# Patient Record
Sex: Female | Born: 1955 | Race: White | Hispanic: No | Smoking: Former smoker
Health system: Southern US, Community
[De-identification: ages and names within clinical notes are randomized; demographics above are authoritative.]

## PROBLEM LIST (undated history)

## (undated) DIAGNOSIS — F319 Bipolar disorder, unspecified: Secondary | ICD-10-CM

## (undated) DIAGNOSIS — M1990 Unspecified osteoarthritis, unspecified site: Secondary | ICD-10-CM

## (undated) DIAGNOSIS — K219 Gastro-esophageal reflux disease without esophagitis: Secondary | ICD-10-CM

## (undated) DIAGNOSIS — J449 Chronic obstructive pulmonary disease, unspecified: Secondary | ICD-10-CM

## (undated) DIAGNOSIS — K2270 Barrett's esophagus without dysplasia: Secondary | ICD-10-CM

## (undated) DIAGNOSIS — E119 Type 2 diabetes mellitus without complications: Secondary | ICD-10-CM

## (undated) DIAGNOSIS — F0390 Unspecified dementia without behavioral disturbance: Secondary | ICD-10-CM

## (undated) HISTORY — PX: BREAST CYST EXCISION: SHX579

## (undated) HISTORY — PX: BREAST SURGERY: SHX581

---

## 2014-03-18 ENCOUNTER — Ambulatory Visit: Payer: Self-pay | Admitting: Internal Medicine

## 2014-03-18 DIAGNOSIS — Z0289 Encounter for other administrative examinations: Secondary | ICD-10-CM

## 2016-02-03 ENCOUNTER — Inpatient Hospital Stay (HOSPITAL_BASED_OUTPATIENT_CLINIC_OR_DEPARTMENT_OTHER)
Admission: EM | Admit: 2016-02-03 | Discharge: 2016-02-11 | DRG: 603 | Disposition: A | Discharge status: Home / Self Care | Payer: Medicare Other | Attending: Internal Medicine | Admitting: Internal Medicine

## 2016-02-03 ENCOUNTER — Emergency Department (HOSPITAL_BASED_OUTPATIENT_CLINIC_OR_DEPARTMENT_OTHER): Payer: Medicare Other

## 2016-02-03 ENCOUNTER — Encounter (HOSPITAL_BASED_OUTPATIENT_CLINIC_OR_DEPARTMENT_OTHER): Payer: Self-pay

## 2016-02-03 DIAGNOSIS — E871 Hypo-osmolality and hyponatremia: Secondary | ICD-10-CM | POA: Diagnosis present

## 2016-02-03 DIAGNOSIS — N10 Acute tubulo-interstitial nephritis: Secondary | ICD-10-CM | POA: Diagnosis present

## 2016-02-03 DIAGNOSIS — E1165 Type 2 diabetes mellitus with hyperglycemia: Secondary | ICD-10-CM | POA: Diagnosis not present

## 2016-02-03 DIAGNOSIS — Z881 Allergy status to other antibiotic agents status: Secondary | ICD-10-CM | POA: Diagnosis not present

## 2016-02-03 DIAGNOSIS — N179 Acute kidney failure, unspecified: Secondary | ICD-10-CM | POA: Diagnosis present

## 2016-02-03 DIAGNOSIS — L03211 Cellulitis of face: Secondary | ICD-10-CM

## 2016-02-03 DIAGNOSIS — Z6841 Body Mass Index (BMI) 40.0 and over, adult: Secondary | ICD-10-CM | POA: Diagnosis not present

## 2016-02-03 DIAGNOSIS — L509 Urticaria, unspecified: Secondary | ICD-10-CM | POA: Diagnosis present

## 2016-02-03 DIAGNOSIS — Z87891 Personal history of nicotine dependence: Secondary | ICD-10-CM

## 2016-02-03 DIAGNOSIS — M1990 Unspecified osteoarthritis, unspecified site: Secondary | ICD-10-CM | POA: Diagnosis present

## 2016-02-03 DIAGNOSIS — F0390 Unspecified dementia without behavioral disturbance: Secondary | ICD-10-CM | POA: Diagnosis present

## 2016-02-03 DIAGNOSIS — D649 Anemia, unspecified: Secondary | ICD-10-CM | POA: Diagnosis present

## 2016-02-03 DIAGNOSIS — Z888 Allergy status to other drugs, medicaments and biological substances status: Secondary | ICD-10-CM

## 2016-02-03 DIAGNOSIS — F319 Bipolar disorder, unspecified: Secondary | ICD-10-CM | POA: Diagnosis present

## 2016-02-03 DIAGNOSIS — Z794 Long term (current) use of insulin: Secondary | ICD-10-CM

## 2016-02-03 DIAGNOSIS — K3184 Gastroparesis: Secondary | ICD-10-CM | POA: Diagnosis present

## 2016-02-03 DIAGNOSIS — J449 Chronic obstructive pulmonary disease, unspecified: Secondary | ICD-10-CM | POA: Diagnosis present

## 2016-02-03 DIAGNOSIS — L03213 Periorbital cellulitis: Principal | ICD-10-CM | POA: Diagnosis present

## 2016-02-03 DIAGNOSIS — Z9981 Dependence on supplemental oxygen: Secondary | ICD-10-CM

## 2016-02-03 DIAGNOSIS — Z9119 Patient's noncompliance with other medical treatment and regimen: Secondary | ICD-10-CM

## 2016-02-03 DIAGNOSIS — K122 Cellulitis and abscess of mouth: Secondary | ICD-10-CM

## 2016-02-03 DIAGNOSIS — E1143 Type 2 diabetes mellitus with diabetic autonomic (poly)neuropathy: Secondary | ICD-10-CM | POA: Diagnosis present

## 2016-02-03 DIAGNOSIS — G4733 Obstructive sleep apnea (adult) (pediatric): Secondary | ICD-10-CM | POA: Diagnosis present

## 2016-02-03 DIAGNOSIS — W06XXXA Fall from bed, initial encounter: Secondary | ICD-10-CM | POA: Diagnosis present

## 2016-02-03 DIAGNOSIS — G43909 Migraine, unspecified, not intractable, without status migrainosus: Secondary | ICD-10-CM | POA: Diagnosis present

## 2016-02-03 DIAGNOSIS — K219 Gastro-esophageal reflux disease without esophagitis: Secondary | ICD-10-CM | POA: Diagnosis present

## 2016-02-03 DIAGNOSIS — R140 Abdominal distension (gaseous): Secondary | ICD-10-CM

## 2016-02-03 DIAGNOSIS — E119 Type 2 diabetes mellitus without complications: Secondary | ICD-10-CM

## 2016-02-03 DIAGNOSIS — Z79899 Other long term (current) drug therapy: Secondary | ICD-10-CM | POA: Diagnosis not present

## 2016-02-03 DIAGNOSIS — Z7951 Long term (current) use of inhaled steroids: Secondary | ICD-10-CM | POA: Diagnosis not present

## 2016-02-03 DIAGNOSIS — L0390 Cellulitis, unspecified: Secondary | ICD-10-CM | POA: Diagnosis present

## 2016-02-03 HISTORY — DX: Unspecified osteoarthritis, unspecified site: M19.90

## 2016-02-03 HISTORY — DX: Unspecified dementia, unspecified severity, without behavioral disturbance, psychotic disturbance, mood disturbance, and anxiety: F03.90

## 2016-02-03 HISTORY — DX: Type 2 diabetes mellitus without complications: E11.9

## 2016-02-03 HISTORY — DX: Gastro-esophageal reflux disease without esophagitis: K21.9

## 2016-02-03 HISTORY — DX: Chronic obstructive pulmonary disease, unspecified: J44.9

## 2016-02-03 HISTORY — DX: Bipolar disorder, unspecified: F31.9

## 2016-02-03 HISTORY — DX: Barrett's esophagus without dysplasia: K22.70

## 2016-02-03 LAB — CBC WITH DIFFERENTIAL/PLATELET
BASOS PCT: 0 %
Basophils Absolute: 0 10*3/uL (ref 0.0–0.1)
EOS PCT: 3 %
Eosinophils Absolute: 0.2 10*3/uL (ref 0.0–0.7)
HEMATOCRIT: 33.3 % — AB (ref 36.0–46.0)
HEMOGLOBIN: 10.8 g/dL — AB (ref 12.0–15.0)
LYMPHS PCT: 16 %
Lymphs Abs: 1 10*3/uL (ref 0.7–4.0)
MCH: 29 pg (ref 26.0–34.0)
MCHC: 32.4 g/dL (ref 30.0–36.0)
MCV: 89.5 fL (ref 78.0–100.0)
Monocytes Absolute: 0.7 10*3/uL (ref 0.1–1.0)
Monocytes Relative: 11 %
NEUTROS ABS: 4.5 10*3/uL (ref 1.7–7.7)
NEUTROS PCT: 70 %
Platelets: 182 10*3/uL (ref 150–400)
RBC: 3.72 MIL/uL — ABNORMAL LOW (ref 3.87–5.11)
RDW: 14 % (ref 11.5–15.5)
WBC: 6.4 10*3/uL (ref 4.0–10.5)

## 2016-02-03 LAB — COMPREHENSIVE METABOLIC PANEL
ALBUMIN: 3.2 g/dL — AB (ref 3.5–5.0)
ALT: 23 U/L (ref 14–54)
ANION GAP: 9 (ref 5–15)
AST: 18 U/L (ref 15–41)
Alkaline Phosphatase: 78 U/L (ref 38–126)
BUN: 5 mg/dL — ABNORMAL LOW (ref 6–20)
CHLORIDE: 96 mmol/L — AB (ref 101–111)
CO2: 25 mmol/L (ref 22–32)
CREATININE: 0.42 mg/dL — AB (ref 0.44–1.00)
Calcium: 8.5 mg/dL — ABNORMAL LOW (ref 8.9–10.3)
GFR calc non Af Amer: >60 mL/min (ref >60)
GLUCOSE: 234 mg/dL — AB (ref 65–99)
Potassium: 3.8 mmol/L (ref 3.5–5.1)
SODIUM: 130 mmol/L — AB (ref 135–145)
Total Bilirubin: 0.3 mg/dL (ref 0.3–1.2)
Total Protein: 7 g/dL (ref 6.5–8.1)

## 2016-02-03 LAB — CBG MONITORING, ED: Glucose-Capillary: 210 mg/dL — ABNORMAL HIGH (ref 65–99)

## 2016-02-03 LAB — I-STAT CG4 LACTIC ACID, ED: Lactic Acid, Venous: 1.49 mmol/L (ref 0.5–1.9)

## 2016-02-03 MED ORDER — VANCOMYCIN HCL 500 MG IV SOLR
INTRAVENOUS | Status: AC
  Filled 2016-02-03: qty 2000

## 2016-02-03 MED ORDER — VANCOMYCIN HCL IN DEXTROSE 1-5 GM/200ML-% IV SOLN: 1000.0000 mg | Freq: Once | INTRAVENOUS | Status: DC

## 2016-02-03 MED ORDER — VANCOMYCIN HCL 10 G IV SOLR
2000.0000 mg | Freq: Once | INTRAVENOUS | Status: DC
  Filled 2016-02-03: qty 2000

## 2016-02-03 MED ORDER — VANCOMYCIN HCL IN DEXTROSE 750-5 MG/150ML-% IV SOLN
750.0000 mg | Freq: Two times a day (BID) | INTRAVENOUS | Status: DC
  Administered 2016-02-04 – 2016-02-06 (×5): 750 mg via INTRAVENOUS
  Filled 2016-02-03 (×7): qty 150

## 2016-02-03 MED ORDER — VANCOMYCIN HCL 10 G IV SOLR
2000.0000 mg | Freq: Once | INTRAVENOUS | Status: AC
  Administered 2016-02-03: 2000 mg via INTRAVENOUS
  Filled 2016-02-03: qty 2000

## 2016-02-03 NOTE — Progress Notes (Signed)
Pharmacy Antibiotic Note  Stephanie Rice is a 60 y.o. female admitted on 02/03/2016 with facial cellulitis.  Pharmacy has been consulted for vancomycin dosing. Per notes, started on clindamycin outpatient several days PTA. Patient afebrile, WBC normal at 6.4, and lactate slightly elevated at 1.49.   Plan: Vancomycin 2g IV once in ED (2 x 1g) Vancomycin 750 mg IV every 12 hours. Goal trough 10-15 mcg/mL. Monitor renal function, clinical picture, culture results, and vancomycin trough as needed.   Height: 5\' 2"  (157.5 cm) Weight: 246 lb (111.6 kg) IBW/kg (Calculated) : 50.1  Temp (24hrs), Avg:98.2 F (36.8 C), Min:98.2 F (36.8 C), Max:98.2 F (36.8 C)   Recent Labs Lab 02/03/16 1510 02/03/16 1518  WBC 6.4  --   CREATININE 0.42*  --   LATICACIDVEN  --  1.49    Estimated Creatinine Clearance: 86 mL/min (by C-G formula based on SCr of 0.42 mg/dL (L)).    Allergies  Allergen Reactions  . Ciprofloxacin   . Macrobid [Nitrofurantoin]     Antimicrobials this admission: 9/20 Vanc >>   Dose adjustments this admission: N/A  Microbiology results: pending   Thank you for allowing pharmacy to be a part of this patient's care.  York CeriseKatherine Cook, PharmD Pharmacy Resident  Pager [phone] 02/03/16 4:04 PM

## 2016-02-03 NOTE — ED Notes (Signed)
Per pt's daughter pt is to have 2L of O2 at bedtime.

## 2016-02-03 NOTE — ED Provider Notes (Signed)
Medical screening exam:   Patient with history of bipolar is a difficult historian. History provided by daughter. Patient presents with 36 hours of right-sided facial swelling and erythema. Questionable history of fall. No known fevers or chills. Recently seen by her primary physician and started on steroids and antibiotics without improvement. Patient has erythematous swelling to the right side of his face mostly in the periorbital region. Oropharynx is clear. She she is in no respiratory distress. We'll get CT head and maxillofacial as well as basic labs.   Loren Raceravid Torian Quintero, MD 02/03/16 [phone]

## 2016-02-03 NOTE — ED Notes (Signed)
Pt's daughter/POA is Clide DeutscherKay Battaglia and phone # is [phone]

## 2016-02-03 NOTE — ED Notes (Signed)
Difficulty obtained 2nd blood cx. 2nd obtained at this time by Lupita Leashonna, RN

## 2016-02-03 NOTE — ED Provider Notes (Signed)
Emergency Department Provider Note   I have reviewed the triage vital signs and the nursing notes.   HISTORY  Chief Complaint Facial Swelling   HPI Stephanie Rice is a 60 y.o. female with PMH of COPD, DM, and GERD presents to the emergency department for evaluation of face swelling and redness that has been worsening over the past 3 days. The patient's family member at bedside states that she's had this once before and was presumed to be either an infection or allergic reaction. The family members states she was treated with antibiotics and the infection improved. They note a fall from bed 3 days ago with no known head trauma. Later that day the patient developed some redness underneath the left eye which quickly spread to involve the right eye, right side of the face, now the back of the head. When the initial swelling and redness began the patient saw her primary care physician who prescribed clindamycin and steroids. The patient has been compliant with his medication but her redness and pain has gotten worse. They deny fever. No pain with extraocular movements. No blurry vision. No difficulty swallowing or breathing. No associated vomiting or diarrhea.    Past Medical History:  Diagnosis Date  . Arthritis   . Barrett esophagus   . COPD (chronic obstructive pulmonary disease) (HCC)   . Dementia   . Diabetes mellitus without complication (HCC)   . GERD (gastroesophageal reflux disease)     Patient Active Problem List   Diagnosis Date Noted  . Cellulitis 02/03/2016    Past Surgical History:  Procedure Laterality Date  . BREAST CYST EXCISION    . BREAST SURGERY        Allergies Ciprofloxacin and Macrobid [nitrofurantoin]  History reviewed. No pertinent family history.  Social History Social History  Substance Use Topics  . Smoking status: Former Games developer  . Smokeless tobacco: Never Used  . Alcohol use No    Review of Systems  Constitutional: No  fever/chills Eyes: No visual changes. ENT: No sore throat. Cardiovascular: Denies chest pain. Respiratory: Denies shortness of breath. Gastrointestinal: No abdominal pain.  No nausea, no vomiting.  No diarrhea.  No constipation. Genitourinary: Negative for dysuria. Musculoskeletal: Negative for back pain. Skin: Rash over face and scalp.  Neurological: Negative for headaches, focal weakness or numbness.  10-point ROS otherwise negative.  ____________________________________________   PHYSICAL EXAM:  VITAL SIGNS: ED Triage Vitals  Enc Vitals Group     BP 02/03/16 1326 123/58     Pulse Rate 02/03/16 1326 76     Resp 02/03/16 1326 20     Temp 02/03/16 1326 98.2 F (36.8 C)     Temp Source 02/03/16 1326 Oral     SpO2 02/03/16 1326 96 %     Weight 02/03/16 1327 246 lb (111.6 kg)     Height 02/03/16 1327 5\' 2"  (1.575 m)   Constitutional: Alert and oriented. Well appearing and in no acute distress. Eyes: Conjunctivae are normal. PERRL. EOMI without pain.  Head: Atraumatic. Nose: No congestion/rhinnorhea. Mouth/Throat: Mucous membranes are moist.  Oropharynx non-erythematous. Neck: No stridor.  Cardiovascular: Normal rate, regular rhythm. Good peripheral circulation. Grossly normal heart sounds.   Respiratory: Normal respiratory effort.  No retractions. Lungs CTAB. Gastrointestinal: Soft and nontender. No distention.  Musculoskeletal: No lower extremity tenderness nor edema. No gross deformities of extremities. Neurologic:  Normal speech and language. No gross focal neurologic deficits are appreciated.  Skin:  Skin is warm, dry and intact. Bilateral periorbital edema  with right face diffuse erythema extending to the posterior scalp.  Psychiatric: Mood and affect are normal. Speech and behavior are normal.  ____________________________________________   LABS (all labs ordered are listed, but only abnormal results are displayed)  Labs Reviewed  COMPREHENSIVE METABOLIC PANEL -  Abnormal; Notable for the following:       Result Value   Sodium 130 (*)    Chloride 96 (*)    Glucose, Bld 234 (*)    BUN 5 (*)    Creatinine, Ser 0.42 (*)    Calcium 8.5 (*)    Albumin 3.2 (*)    All other components within normal limits  CBC WITH DIFFERENTIAL/PLATELET - Abnormal; Notable for the following:    RBC 3.72 (*)    Hemoglobin 10.8 (*)    HCT 33.3 (*)    All other components within normal limits  CBG MONITORING, ED - Abnormal; Notable for the following:    Glucose-Capillary 210 (*)    All other components within normal limits  CULTURE, BLOOD (ROUTINE X 2)  CULTURE, BLOOD (ROUTINE X 2)  CBC WITH DIFFERENTIAL/PLATELET  I-STAT CG4 LACTIC ACID, ED   ____________________________________________  EKG   EKG Interpretation  Date/Time:  Wednesday February 03 2016 15:36:49 EDT Ventricular Rate:  68 PR Interval:    QRS Duration: 92 QT Interval:  400 QTC Calculation: 426 R Axis:   84 Text Interpretation:  Sinus rhythm Borderline right axis deviation Borderline T abnormalities, lateral leads Baseline wander in lead(s) V6 No STEMI.  Confirmed by Brendolyn Stockley MD, Mylz Yuan [phone]([id number]) on 02/03/2016 4:42:36 PM       ____________________________________________  RADIOLOGY  Ct Head Wo Contrast  Result Date: 02/03/2016 CLINICAL DATA:  60 year old female with right facial swelling and redness for 3 days. Possible fall. Initial encounter. EXAM: CT HEAD WITHOUT CONTRAST CT MAXILLOFACIAL WITHOUT CONTRAST TECHNIQUE: Multidetector CT imaging of the head and maxillofacial structures were performed using the standard protocol without intravenous contrast. Multiplanar CT image reconstructions of the maxillofacial structures were also generated. COMPARISON:  [facility] noncontrast head CT 09/05/2005 FINDINGS: CT HEAD FINDINGS Brain: Mild generalized cerebral volume loss, but volume remains within normal limits for age. No midline shift, ventriculomegaly, mass effect, evidence of mass  lesion, intracranial hemorrhage or evidence of cortically based acute infarction. Gray-white matter differentiation is within normal limits throughout the brain. Minimal to mild for age nonspecific white matter hypodensity. Vascular: No suspicious intracranial vascular hyperdensity. Skull: No acute osseous abnormality identified. Sinuses/Orbits: Visualized paranasal sinuses and mastoids are stable and well pneumatized. Other: No acute orbit or scalp soft tissue findings. CT MAXILLOFACIAL FINDINGS Osseous: Absent dentition. Mandible intact. Maxilla intact. No facial fracture identified. Advanced degenerative changes in the visible cervical spine including bulky anterior endplate osteophytosis. No acute osseous abnormality identified. Orbits: Negative. Sinuses: Clear. Soft tissues: Negative visualized noncontrast larynx, pharynx, parapharyngeal spaces, retropharyngeal space, sublingual space, submandibular glands and parotid glands. No fluid collection identified in the face or upper neck. Visible cervical and submandibular lymph nodes are normal. Limited intracranial: Reported above. IMPRESSION: 1. No acute intracranial abnormality and largely unremarkable for age noncontrast CT appearance of the brain. 2. No acute osseous abnormality or inflammatory process identified in the face. Absent dentition. Clear paranasal sinuses. 3. Cervical spine diffuse idiopathic skeletal hyperostosis. Electronically Signed   By: Odessa FlemingH  Hall M.D.   On: 02/03/2016 16:13   Ct Maxillofacial Wo Contrast  Result Date: 02/03/2016 CLINICAL DATA:  60 year old female with right facial swelling and redness for 3 days. Possible  fall. Initial encounter. EXAM: CT HEAD WITHOUT CONTRAST CT MAXILLOFACIAL WITHOUT CONTRAST TECHNIQUE: Multidetector CT imaging of the head and maxillofacial structures were performed using the standard protocol without intravenous contrast. Multiplanar CT image reconstructions of the maxillofacial structures were also  generated. COMPARISON:  [facility] noncontrast head CT 09/05/2005 FINDINGS: CT HEAD FINDINGS Brain: Mild generalized cerebral volume loss, but volume remains within normal limits for age. No midline shift, ventriculomegaly, mass effect, evidence of mass lesion, intracranial hemorrhage or evidence of cortically based acute infarction. Gray-white matter differentiation is within normal limits throughout the brain. Minimal to mild for age nonspecific white matter hypodensity. Vascular: No suspicious intracranial vascular hyperdensity. Skull: No acute osseous abnormality identified. Sinuses/Orbits: Visualized paranasal sinuses and mastoids are stable and well pneumatized. Other: No acute orbit or scalp soft tissue findings. CT MAXILLOFACIAL FINDINGS Osseous: Absent dentition. Mandible intact. Maxilla intact. No facial fracture identified. Advanced degenerative changes in the visible cervical spine including bulky anterior endplate osteophytosis. No acute osseous abnormality identified. Orbits: Negative. Sinuses: Clear. Soft tissues: Negative visualized noncontrast larynx, pharynx, parapharyngeal spaces, retropharyngeal space, sublingual space, submandibular glands and parotid glands. No fluid collection identified in the face or upper neck. Visible cervical and submandibular lymph nodes are normal. Limited intracranial: Reported above. IMPRESSION: 1. No acute intracranial abnormality and largely unremarkable for age noncontrast CT appearance of the brain. 2. No acute osseous abnormality or inflammatory process identified in the face. Absent dentition. Clear paranasal sinuses. 3. Cervical spine diffuse idiopathic skeletal hyperostosis. Electronically Signed   By: Odessa Fleming M.D.   On: 02/03/2016 16:13    ____________________________________________   PROCEDURES  Procedure(s) performed:   Procedures  None ____________________________________________   INITIAL IMPRESSION / ASSESSMENT AND PLAN  / ED COURSE  Pertinent labs & imaging results that were available during my care of the patient were reviewed by me and considered in my medical decision making (see chart for details).  Patient resents to the emergency pertinent for evaluation of bilateral face swelling and erythema most consistent with face cellulitis. The area extends from the left eye to the right, involving the right face, ear, posterior scalp. No blistering or drainage in the area. Rash is not consistent with hives or acute anaphylaxis. The patient has complicating risk factors of COPD and diabetes. She has been on outpatient clindamycin but is not improving. Patient has normal extraocular movements and no focal sign of infection. I do not suspect this is from a traumatic injury 3 days ago. The patient is at her mental status baseline.   05:16 PM Labs reviewed and largely unremarkable. CT imaging reviewed as well. Updated patient and family regarding imaging and labs. Vancomycin running with pharmacy to dose. Plan to admit given multiple medical co-morbidities and relatively rapid spread despite outpatient clindamycin. No concern for impending airway compromise.   Discussed patient's case with hospitalist, Dr. Robb Matar.  Recommend admission to inpatient, med-surg bed.  I will place holding orders per their request. Patient and family (if present) updated with plan. Care transferred to hospitalist service.  I reviewed all nursing notes, vitals, pertinent old records, EKGs, labs, imaging (as available). ____________________________________________  FINAL CLINICAL IMPRESSION(S) / ED DIAGNOSES  Final diagnoses:  Facial cellulitis     MEDICATIONS GIVEN DURING THIS VISIT:  Medications  vancomycin (VANCOCIN) 500 MG powder (not administered)  vancomycin (VANCOCIN) IVPB 750 mg/150 ml premix (not administered)  vancomycin (VANCOCIN) 2,000 mg in sodium chloride 0.9 % 500 mL IVPB (0 mg Intravenous Stopped 02/03/16 1856)  NEW  OUTPATIENT MEDICATIONS STARTED DURING THIS VISIT:  None   Note:  This document was prepared using Dragon voice recognition software and may include unintentional dictation errors.  Alona Bene, MD Emergency Medicine   Maia Plan, MD 02/03/16 [phone]

## 2016-02-03 NOTE — Progress Notes (Signed)
Patient ID: Rowe RobertDeborah Rice, female   DOB: 09-09-1955, 60 y.o.   MRN: [id number] Accepted to MedSurg bed as an inpatient for facial cellulitis treatment with IV antibiotics.  Please call the floor manager at extension [phone] for admitting physician assignment upon patient's arrival to the floor.  60 year old female with a past medical history of osteoarthritis, Barrett's esophagus, COPD, dementia, type 2 diabetes, GERD who is being transferred from [facility] due to progressively worse facial cellulitis that has not responded to oral clindamycin. WBC 66.4, hemoglobin 10.8 g/dL, sodium 191130 mmol/L, glucose 230 mg/dL. Lactic acid was normal. The patient was started on IV vancomycin in the emergency department.  Report Status PENDING   CBC with Differential [[id number]][[id number]] (Abnormal) Collected: 02/03/16 1510  Updated: 02/03/16 1559   Specimen Type: Blood    WBC 6.4 K/uL   RBC 3.72 (L) MIL/uL   Hemoglobin 10.8 (L) g/dL   HCT 30.833.3 (L) %   MCV 89.5 fL   MCH 29.0 pg   MCHC 32.4 g/dL   RDW 65.714.0 %   Platelets 182 K/uL   Neutrophils Relative % 70 %   Lymphocytes Relative 16 %   Monocytes Relative 11 %   Eosinophils Relative 3 %   Basophils Relative 0 %   Neutro Abs 4.5 K/uL   Lymphs Abs 1.0 K/uL   Monocytes Absolute 0.7 K/uL   Eosinophils Absolute 0.2 K/uL   Basophils Absolute 0.0 K/uL  Comprehensive metabolic panel [[id number]][[id number]] (Abnormal) Collected: 02/03/16 1510  Updated: 02/03/16 1543   Specimen Type: Blood    Sodium 130 (L) mmol/L   Potassium 3.8 mmol/L   Chloride 96 (L) mmol/L   CO2 25 mmol/L   Glucose, Bld 234 (H) mg/dL   BUN 5 (L) mg/dL   Creatinine, Ser 8.410.42 (L) mg/dL   Calcium 8.5 (L) mg/dL   Total Protein 7.0 g/dL   Albumin 3.2 (L) g/dL   AST 18 U/L   ALT 23 U/L   Alkaline Phosphatase 78 U/L   Total Bilirubin 0.3 mg/dL   GFR calc non Af Amer >60 mL/min   GFR calc Af Amer >60 mL/min   Anion gap 9  I-Stat CG4 Lactic Acid, ED [[id number]][[id number]] Collected: 02/03/16 1518   Updated: 02/03/16 1520   Specimen Type: Blood    Lactic Acid, Venous 1.49 mmol/L     CT head without contrast/CT maxillofacial without contrast  IMPRESSION: 1. No acute intracranial abnormality and largely unremarkable for age noncontrast CT appearance of the brain. 2. No acute osseous abnormality or inflammatory process identified in the face. Absent dentition. Clear paranasal sinuses. 3. Cervical spine diffuse idiopathic skeletal hyperostosis.  -----------------------------------------------------------------------------------------------------------------------  Sanda Kleinavid Ortiz, M.D. [phone].

## 2016-02-03 NOTE — ED Triage Notes (Signed)
Per daughter pt with facial swelling x 3 days-seen by PCP 2 days ago-started on abx and steroid-increase in facial swelling that extends to right side of face and ear-pt with hx dementia-NAD-extensive swelling noted to right side of face, eye and ear

## 2016-02-04 ENCOUNTER — Encounter (HOSPITAL_COMMUNITY): Payer: Self-pay | Admitting: Internal Medicine

## 2016-02-04 ENCOUNTER — Inpatient Hospital Stay (HOSPITAL_COMMUNITY): Payer: Medicare Other

## 2016-02-04 DIAGNOSIS — F319 Bipolar disorder, unspecified: Secondary | ICD-10-CM | POA: Diagnosis present

## 2016-02-04 DIAGNOSIS — L03211 Cellulitis of face: Secondary | ICD-10-CM

## 2016-02-04 DIAGNOSIS — E119 Type 2 diabetes mellitus without complications: Secondary | ICD-10-CM

## 2016-02-04 DIAGNOSIS — F0390 Unspecified dementia without behavioral disturbance: Secondary | ICD-10-CM | POA: Diagnosis present

## 2016-02-04 DIAGNOSIS — D649 Anemia, unspecified: Secondary | ICD-10-CM | POA: Diagnosis present

## 2016-02-04 LAB — COMPREHENSIVE METABOLIC PANEL
ALBUMIN: 3.1 g/dL — AB (ref 3.5–5.0)
ALK PHOS: 86 U/L (ref 38–126)
ALT: 25 U/L (ref 14–54)
AST: 18 U/L (ref 15–41)
Anion gap: 12 (ref 5–15)
BILIRUBIN TOTAL: 0.5 mg/dL (ref 0.3–1.2)
BUN: <5 mg/dL — ABNORMAL LOW (ref 6–20)
CALCIUM: 9.2 mg/dL (ref 8.9–10.3)
CO2: 24 mmol/L (ref 22–32)
CREATININE: 0.51 mg/dL (ref 0.44–1.00)
Chloride: 106 mmol/L (ref 101–111)
GFR calc Af Amer: >60 mL/min (ref >60)
GFR calc non Af Amer: >60 mL/min (ref >60)
GLUCOSE: 169 mg/dL — AB (ref 65–99)
Potassium: 3.8 mmol/L (ref 3.5–5.1)
SODIUM: 142 mmol/L (ref 135–145)
Total Protein: 7.1 g/dL (ref 6.5–8.1)

## 2016-02-04 LAB — CBC WITH DIFFERENTIAL/PLATELET
BASOS PCT: 0 %
Basophils Absolute: 0 10*3/uL (ref 0.0–0.1)
EOS ABS: 0.2 10*3/uL (ref 0.0–0.7)
EOS PCT: 3 %
HCT: 36.3 % (ref 36.0–46.0)
Hemoglobin: 11.4 g/dL — ABNORMAL LOW (ref 12.0–15.0)
Lymphocytes Relative: 17 %
Lymphs Abs: 1.2 10*3/uL (ref 0.7–4.0)
MCH: 28 pg (ref 26.0–34.0)
MCHC: 31.4 g/dL (ref 30.0–36.0)
MCV: 89.2 fL (ref 78.0–100.0)
MONO ABS: 0.8 10*3/uL (ref 0.1–1.0)
MONOS PCT: 12 %
Neutro Abs: 4.8 10*3/uL (ref 1.7–7.7)
Neutrophils Relative %: 68 %
PLATELETS: 208 10*3/uL (ref 150–400)
RBC: 4.07 MIL/uL (ref 3.87–5.11)
RDW: 13.9 % (ref 11.5–15.5)
WBC: 7.1 10*3/uL (ref 4.0–10.5)

## 2016-02-04 LAB — GLUCOSE, CAPILLARY
GLUCOSE-CAPILLARY: 161 mg/dL — AB (ref 65–99)
GLUCOSE-CAPILLARY: 200 mg/dL — AB (ref 65–99)
Glucose-Capillary: 173 mg/dL — ABNORMAL HIGH (ref 65–99)
Glucose-Capillary: 148 mg/dL — ABNORMAL HIGH (ref 65–99)

## 2016-02-04 LAB — CBG MONITORING, ED: GLUCOSE-CAPILLARY: 240 mg/dL — AB (ref 65–99)

## 2016-02-04 MED ORDER — MEMANTINE HCL ER 28 MG PO CP24
28.0000 mg | ORAL_CAPSULE | Freq: Every day | ORAL | Status: DC
  Administered 2016-02-04 – 2016-02-10 (×7): 28 mg via ORAL
  Filled 2016-02-04 (×7): qty 1

## 2016-02-04 MED ORDER — ONDANSETRON HCL 4 MG PO TABS
4.0000 mg | ORAL_TABLET | Freq: Four times a day (QID) | ORAL | Status: DC | PRN
  Administered 2016-02-08: 4 mg via ORAL
  Filled 2016-02-04: qty 1

## 2016-02-04 MED ORDER — ONDANSETRON HCL 4 MG/2ML IJ SOLN
4.0000 mg | Freq: Four times a day (QID) | INTRAMUSCULAR | Status: DC | PRN
  Administered 2016-02-06: 4 mg via INTRAVENOUS
  Filled 2016-02-04: qty 2

## 2016-02-04 MED ORDER — GABAPENTIN 300 MG PO CAPS
300.0000 mg | ORAL_CAPSULE | Freq: Three times a day (TID) | ORAL | Status: DC
  Administered 2016-02-04 – 2016-02-11 (×22): 300 mg via ORAL
  Filled 2016-02-04 (×22): qty 1

## 2016-02-04 MED ORDER — FLUTICASONE FUROATE-VILANTEROL 100-25 MCG/INH IN AEPB
1.0000 | INHALATION_SPRAY | Freq: Every day | RESPIRATORY_TRACT | Status: DC
  Administered 2016-02-04 – 2016-02-11 (×8): 1 via RESPIRATORY_TRACT
  Filled 2016-02-04: qty 28

## 2016-02-04 MED ORDER — DONEPEZIL HCL 10 MG PO TABS
10.0000 mg | ORAL_TABLET | Freq: Once | ORAL | Status: AC
  Administered 2016-02-04: 10 mg via ORAL
  Filled 2016-02-04: qty 1

## 2016-02-04 MED ORDER — SODIUM CHLORIDE 0.9 % IV SOLN
INTRAVENOUS | Status: AC
  Administered 2016-02-04 (×2): via INTRAVENOUS

## 2016-02-04 MED ORDER — ENOXAPARIN SODIUM 60 MG/0.6ML ~~LOC~~ SOLN
55.0000 mg | SUBCUTANEOUS | Status: DC
  Administered 2016-02-04 – 2016-02-11 (×8): 55 mg via SUBCUTANEOUS
  Filled 2016-02-04 (×8): qty 0.55

## 2016-02-04 MED ORDER — VANCOMYCIN HCL IN DEXTROSE 1-5 GM/200ML-% IV SOLN: 1000.0000 mg | Freq: Once | INTRAVENOUS | Status: DC

## 2016-02-04 MED ORDER — INSULIN LISPRO 100 UNIT/ML ~~LOC~~ SOLN: 20.0000 [IU] | Freq: Every morning | SUBCUTANEOUS | Status: DC

## 2016-02-04 MED ORDER — PRIMIDONE 50 MG PO TABS
50.0000 mg | ORAL_TABLET | Freq: Every morning | ORAL | Status: DC
  Administered 2016-02-04 – 2016-02-11 (×8): 50 mg via ORAL
  Filled 2016-02-04 (×8): qty 1

## 2016-02-04 MED ORDER — MEMANTINE HCL ER 28 MG PO CP24
28.0000 mg | ORAL_CAPSULE | Freq: Once | ORAL | Status: AC
  Administered 2016-02-04: 28 mg via ORAL
  Filled 2016-02-04: qty 1

## 2016-02-04 MED ORDER — POTASSIUM CHLORIDE CRYS ER 10 MEQ PO TBCR
10.0000 meq | EXTENDED_RELEASE_TABLET | Freq: Every day | ORAL | Status: DC
  Administered 2016-02-04 – 2016-02-10 (×7): 10 meq via ORAL
  Filled 2016-02-04 (×8): qty 1

## 2016-02-04 MED ORDER — METOCLOPRAMIDE HCL 10 MG PO TABS
10.0000 mg | ORAL_TABLET | Freq: Three times a day (TID) | ORAL | Status: DC
  Administered 2016-02-04 – 2016-02-11 (×29): 10 mg via ORAL
  Filled 2016-02-04 (×28): qty 1

## 2016-02-04 MED ORDER — ALBUTEROL SULFATE (2.5 MG/3ML) 0.083% IN NEBU: 2.5000 mg | INHALATION_SOLUTION | RESPIRATORY_TRACT | Status: DC | PRN

## 2016-02-04 MED ORDER — CLONAZEPAM 0.5 MG PO TABS
0.5000 mg | ORAL_TABLET | Freq: Three times a day (TID) | ORAL | Status: DC | PRN
  Administered 2016-02-04 – 2016-02-10 (×2): 0.5 mg via ORAL
  Filled 2016-02-04 (×2): qty 1

## 2016-02-04 MED ORDER — ACETAMINOPHEN 325 MG PO TABS
650.0000 mg | ORAL_TABLET | Freq: Four times a day (QID) | ORAL | Status: DC | PRN
  Administered 2016-02-09: 650 mg via ORAL
  Filled 2016-02-04: qty 2

## 2016-02-04 MED ORDER — SERTRALINE HCL 100 MG PO TABS
150.0000 mg | ORAL_TABLET | Freq: Every day | ORAL | Status: DC
  Administered 2016-02-04 – 2016-02-10 (×7): 150 mg via ORAL
  Filled 2016-02-04 (×7): qty 1

## 2016-02-04 MED ORDER — NIACIN 100 MG PO TABS
50.0000 mg | ORAL_TABLET | Freq: Every day | ORAL | Status: DC
  Administered 2016-02-04 – 2016-02-10 (×7): 50 mg via ORAL
  Filled 2016-02-04 (×8): qty 1

## 2016-02-04 MED ORDER — LOXAPINE SUCCINATE 5 MG PO CAPS
20.0000 mg | ORAL_CAPSULE | Freq: Every day | ORAL | Status: DC
  Administered 2016-02-04 – 2016-02-10 (×7): 20 mg via ORAL
  Filled 2016-02-04 (×4): qty 4
  Filled 2016-02-04: qty 2
  Filled 2016-02-04: qty 4
  Filled 2016-02-04: qty 2

## 2016-02-04 MED ORDER — MEMANTINE HCL-DONEPEZIL HCL ER 28-10 MG PO CP24: 1.0000 | ORAL_CAPSULE | Freq: Once | ORAL | Status: DC

## 2016-02-04 MED ORDER — DONEPEZIL HCL 10 MG PO TABS
10.0000 mg | ORAL_TABLET | Freq: Every day | ORAL | Status: DC
  Administered 2016-02-04 – 2016-02-10 (×7): 10 mg via ORAL
  Filled 2016-02-04 (×7): qty 1

## 2016-02-04 MED ORDER — TOPIRAMATE 100 MG PO TABS
100.0000 mg | ORAL_TABLET | Freq: Every day | ORAL | Status: DC
  Administered 2016-02-04 – 2016-02-11 (×8): 100 mg via ORAL
  Filled 2016-02-04 (×8): qty 1

## 2016-02-04 MED ORDER — TOPIRAMATE 100 MG PO TABS
200.0000 mg | ORAL_TABLET | Freq: Every day | ORAL | Status: DC
  Administered 2016-02-04 – 2016-02-10 (×7): 200 mg via ORAL
  Filled 2016-02-04 (×7): qty 2

## 2016-02-04 MED ORDER — INSULIN ASPART 100 UNIT/ML ~~LOC~~ SOLN
0.0000 [IU] | Freq: Three times a day (TID) | SUBCUTANEOUS | Status: DC
  Administered 2016-02-04 (×2): 2 [IU] via SUBCUTANEOUS
  Administered 2016-02-04 – 2016-02-05 (×2): 1 [IU] via SUBCUTANEOUS
  Administered 2016-02-05 – 2016-02-06 (×3): 3 [IU] via SUBCUTANEOUS
  Administered 2016-02-06: 1 [IU] via SUBCUTANEOUS
  Administered 2016-02-06: 3 [IU] via SUBCUTANEOUS
  Administered 2016-02-07: 2 [IU] via SUBCUTANEOUS
  Administered 2016-02-07 (×2): 3 [IU] via SUBCUTANEOUS
  Administered 2016-02-08 (×3): 2 [IU] via SUBCUTANEOUS
  Administered 2016-02-09 (×2): 3 [IU] via SUBCUTANEOUS
  Administered 2016-02-09: 2 [IU] via SUBCUTANEOUS
  Administered 2016-02-10: 1 [IU] via SUBCUTANEOUS
  Administered 2016-02-10: 2 [IU] via SUBCUTANEOUS
  Administered 2016-02-10: 1 [IU] via SUBCUTANEOUS
  Administered 2016-02-11 (×2): 2 [IU] via SUBCUTANEOUS

## 2016-02-04 MED ORDER — PIPERACILLIN-TAZOBACTAM 3.375 G IVPB 30 MIN
3.3750 g | Freq: Once | INTRAVENOUS | Status: AC
  Administered 2016-02-04: 3.375 g via INTRAVENOUS
  Filled 2016-02-04: qty 50

## 2016-02-04 MED ORDER — ACETAMINOPHEN 650 MG RE SUPP: 650.0000 mg | Freq: Four times a day (QID) | RECTAL | Status: DC | PRN

## 2016-02-04 MED ORDER — INSULIN ASPART 100 UNIT/ML ~~LOC~~ SOLN: 20.0000 [IU] | Freq: Every day | SUBCUTANEOUS | Status: DC

## 2016-02-04 MED ORDER — PIPERACILLIN-TAZOBACTAM 3.375 G IVPB
3.3750 g | Freq: Three times a day (TID) | INTRAVENOUS | Status: DC
  Administered 2016-02-04 – 2016-02-06 (×6): 3.375 g via INTRAVENOUS
  Filled 2016-02-04 (×10): qty 50

## 2016-02-04 MED ORDER — PANTOPRAZOLE SODIUM 40 MG PO TBEC
40.0000 mg | DELAYED_RELEASE_TABLET | Freq: Every day | ORAL | Status: DC
  Administered 2016-02-04 – 2016-02-06 (×3): 40 mg via ORAL
  Filled 2016-02-04 (×3): qty 1

## 2016-02-04 MED ORDER — MONTELUKAST SODIUM 10 MG PO TABS
10.0000 mg | ORAL_TABLET | Freq: Every day | ORAL | Status: DC
  Administered 2016-02-04 – 2016-02-10 (×7): 10 mg via ORAL
  Filled 2016-02-04 (×7): qty 1

## 2016-02-04 NOTE — Progress Notes (Signed)
Pharmacy Antibiotic Note  Stephanie RobertDeborah Rice is a 60 y.o. female admitted on 02/03/2016 with facial cellulitis.  Pharmacy has been consulted for vancomycin and zosyn dosing. Per notes, started on clindamycin outpatient several days PTA.  Plan: ContinueVancomycin 750 mg IV every 12 hours. Goal trough 10-15 mcg/mL. Zosyn 3.375gm IV now over 30 min then 3.375gm IV q8h - subsequent doses over 4 hours Will f/u micro data, renal function, and pt's clinical condition Vanc trough prn   Height: 5\' 2"  (157.5 cm) Weight: 239 lb 3.2 oz (108.5 kg) IBW/kg (Calculated) : 50.1  Temp (24hrs), Avg:98.3 F (36.8 C), Min:98 F (36.7 C), Max:98.8 F (37.1 C)   Recent Labs Lab 02/03/16 1510 02/03/16 1518  WBC 6.4  --   CREATININE 0.42*  --   LATICACIDVEN  --  1.49    Estimated Creatinine Clearance: 84.6 mL/min (by C-G formula based on SCr of 0.42 mg/dL (L)).    Allergies  Allergen Reactions  . Ciprofloxacin   . Macrobid [Nitrofurantoin]     Antimicrobials this admission: 9/20 Vanc >>  9/21 Zosyn >>  Dose adjustments this admission: N/A  Microbiology results:  9/20 BCx x2:  Thank you for allowing pharmacy to be a part of this patient's care.  Christoper Fabianaron Maryon Kemnitz, PharmD, BCPS Clinical pharmacist, pager [phone] 02/04/16 2:42 AM

## 2016-02-04 NOTE — Progress Notes (Signed)
Inpatient Diabetes Program Recommendations  AACE/ADA: New Consensus Statement on Inpatient Glycemic Control (2015)  Target Ranges:  Prepandial:   less than 140 mg/dL      Peak postprandial:   less than 180 mg/dL (1-2 hours)      Critically ill patients:  140 - 180 mg/dL   Lab Results  Component Value Date   GLUCAP 161 (H) 02/04/2016    Review of Glycemic Control  Results for Stephanie RobertMCMAHAN, Ariahna (MRN [id number]) as of 02/04/2016 09:44  Ref. Range 02/03/2016 22:19 02/04/2016 00:03 02/04/2016 06:01  Glucose-Capillary Latest Ref Range: 65 - 99 mg/dL 409210 (H) 811240 (H) 914161 (H)    Diabetes history: Type 2 Outpatient Diabetes medications: Humalog 20 units qam, Metformin 1000mg  bid  Current orders for Inpatient glycemic control: Novolog sensitive correction 0-9 units tid  Inpatient Diabetes Program Recommendations:    Per ADA recommendations "consider performing an A1C on all patients with diabetes or hyperglycemia admitted to the hospital if not performed in the prior 3 months".  Susette RacerJulie Harlen Danford, RN, BA, MHA, CDE Diabetes Coordinator Inpatient Diabetes Program  [phone] (Team Pager) [phone] [facility] Office) 02/04/2016 9:55 AM

## 2016-02-04 NOTE — Progress Notes (Addendum)
Patient seen and examined  60 y.o. female with diabetes mellitus type 2, dementia, COPD was brought to the ER because of worsening right facial erythema swelling and pain. Patient has been taking oral antibiotics for last 2 days despite which patient's swelling did not improve and was worsening. On exam patient has right facial erythema involving the right maxillary and right periorbital area. CT scan does not show any involvement of the bone or orbits. Patient has been admitted for IV antibiotics. On exam patient is able to move eyes without difficulty.    Assessment and plan  1. Right facial cellulitis -mostly periorbital,  patient's symptoms were  not improving on oral antibiotics patient is placed on IV antibiotics including vancomycin and Zosyn. Follow cultures. 2. Diabetes mellitus type 2 - patient is placed on sliding scale coverage. Hold metformin while inpatient. Check hemoglobin A1c 3. History of dementia on Namenda and Aricept. 4. History of migraine on Topamax. 5. Chronic anemia - follow CBC. 6. Abdominal distension: belly distended with fluid shift, obtain CT abdomen and pelvis  7. Discussed with daughter Clide DeutscherMcMahan,Kay

## 2016-02-04 NOTE — Progress Notes (Addendum)
Patient arrived to unit via [facility]AR, [facility].  Paged MD pt arrived to unit. Vitals stable, pt alert and oriented x3, no family at bedside, only clothes with patient at this time. Will continue to monitor patient.   Unable to complete admission d/t pt's history of dementia, pt repeatedly stating "My daughter normally does all the talking, I just don't know"

## 2016-02-04 NOTE — H&P (Signed)
History and Physical    Stephanie Rice ZOX:[id number] DOB: [birth year] DOA: 02/03/2016  PCP: No primary care provider on file.  Patient coming from: Home.  Chief Complaint: Right facial pain and swelling.  HPI: Stephanie Rice is a 60 y.o. female with diabetes mellitus type 2, dementia, COPD was brought to the ER because of worsening right facial erythema swelling and pain. Patient has been taking oral antibiotics for last 2 days despite which patient's swelling did not improve and was worsening. On exam patient has right facial erythema involving the right maxillary and right periorbital area. CT scan does not show any involvement of the bone or orbits. Patient has been admitted for IV antibiotics. On exam patient is able to move eyes without difficulty.   ED Course: Patient was started on empiric antibiotics.  Review of Systems: As per HPI, rest all negative.   Past Medical History:  Diagnosis Date  . Arthritis   . Barrett esophagus   . Bipolar 1 disorder (HCC)   . COPD (chronic obstructive pulmonary disease) (HCC)   . Dementia   . Diabetes mellitus without complication (HCC)   . GERD (gastroesophageal reflux disease)     Past Surgical History:  Procedure Laterality Date  . BREAST CYST EXCISION    . BREAST SURGERY       reports that she has quit smoking. She has never used smokeless tobacco. She reports that she does not drink alcohol or use drugs.  Allergies  Allergen Reactions  . Ciprofloxacin   . Macrobid [Nitrofurantoin]     Family History  Problem Relation Age of Onset  . Dementia Other   . Diabetes Other     Prior to Admission medications   Medication Sig Start Date End Date Taking? Authorizing Provider  albuterol (PROVENTIL) (2.5 MG/3ML) 0.083% nebulizer solution Take 2.5 mg by nebulization every 4 (four) hours as needed for wheezing or shortness of breath.   Yes Historical Provider, MD  clonazePAM (KLONOPIN) 0.5 MG tablet Take 0.5 mg by mouth 3 (three)  times daily as needed for anxiety.   Yes Historical Provider, MD  diclofenac (VOLTAREN) 75 MG EC tablet Take 75 mg by mouth 2 (two) times daily.   Yes Historical Provider, MD  fluticasone furoate-vilanterol (BREO ELLIPTA) 100-25 MCG/INH AEPB Inhale 1 puff into the lungs daily.   Yes Historical Provider, MD  gabapentin (NEURONTIN) 600 MG tablet Take 300 mg by mouth 3 (three) times daily.    Yes Historical Provider, MD  insulin lispro (HUMALOG) 100 UNIT/ML injection Inject 20 Units into the skin every morning.   Yes Historical Provider, MD  loxapine (LOXITANE) 10 MG capsule Take 20 mg by mouth at bedtime.    Yes Historical Provider, MD  Memantine HCl-Donepezil HCl (NAMZARIC) 28-10 MG CP24 Take 1 capsule by mouth once. QHS   Yes Historical Provider, MD  metFORMIN (GLUCOPHAGE) 1000 MG tablet Take 1,000 mg by mouth 2 (two) times daily with a meal.   Yes Historical Provider, MD  metoCLOPramide (REGLAN) 10 MG tablet Take 10 mg by mouth 4 (four) times daily.   Yes Historical Provider, MD  montelukast (SINGULAIR) 10 MG tablet Take 10 mg by mouth at bedtime.   Yes Historical Provider, MD  niacin 50 MG tablet Take 50 mg by mouth at bedtime.   Yes Historical Provider, MD  omeprazole (PRILOSEC) 40 MG capsule Take 40 mg by mouth 2 (two) times daily.   Yes Historical Provider, MD  OXYGEN Inhale 2 L into the lungs at  bedtime.   Yes Historical Provider, MD  potassium chloride (K-DUR) 10 MEQ tablet Take 10 mEq by mouth daily.   Yes Historical Provider, MD  primidone (MYSOLINE) 50 MG tablet Take by mouth every morning.   Yes Historical Provider, MD  sertraline (ZOLOFT) 100 MG tablet Take 150 mg by mouth at bedtime.   Yes Historical Provider, MD  topiramate (TOPAMAX) 100 MG tablet Take 100 mg by mouth 2 (two) times daily. 100mg  in the AM and 200mg  QHS   Yes Historical Provider, MD    Physical Exam: Vitals:   02/03/16 2030 02/03/16 2100 02/03/16 2359 02/04/16 0107  BP: 117/55 117/67 144/79 (!) 149/69  Pulse: 76 75  75 85  Resp:   18 16  Temp:   98.8 F (37.1 C) 98 F (36.7 C)  TempSrc:   Oral Oral  SpO2: 91% 94% 94% 94%  Weight:    239 lb 3.2 oz (108.5 kg)  Height:    5\' 2"  (1.575 m)      Constitutional: Not in distress. Vitals:   02/03/16 2030 02/03/16 2100 02/03/16 2359 02/04/16 0107  BP: 117/55 117/67 144/79 (!) 149/69  Pulse: 76 75 75 85  Resp:   18 16  Temp:   98.8 F (37.1 C) 98 F (36.7 C)  TempSrc:   Oral Oral  SpO2: 91% 94% 94% 94%  Weight:    239 lb 3.2 oz (108.5 kg)  Height:    5\' 2"  (1.575 m)   Eyes: Right periorbital area is erythematous. Patient able to move eyes. ENMT: No discharge from the ears eyes nose or mouth. Neck: No neck rigidity. No mass felt. Respiratory: No rhonchi or crepitations. Cardiovascular: S1-S2 heard. Abdomen: Soft nontender bowel sounds present. Musculoskeletal: No edema. Skin: Right facial erythema involving the right periorbital and right maxillary area. Neurologic: Alert awake oriented to name and place. Moves all extremities. Psychiatric: Has dementia.   Labs on Admission: I have personally reviewed following labs and imaging studies  CBC:  Recent Labs Lab 02/03/16 1510  WBC 6.4  NEUTROABS 4.5  HGB 10.8*  HCT 33.3*  MCV 89.5  PLT 182   Basic Metabolic Panel:  Recent Labs Lab 02/03/16 1510  NA 130*  K 3.8  CL 96*  CO2 25  GLUCOSE 234*  BUN 5*  CREATININE 0.42*  CALCIUM 8.5*   GFR: Estimated Creatinine Clearance: 84.6 mL/min (by C-G formula based on SCr of 0.42 mg/dL (L)). Liver Function Tests:  Recent Labs Lab 02/03/16 1510  AST 18  ALT 23  ALKPHOS 78  BILITOT 0.3  PROT 7.0  ALBUMIN 3.2*   No results for input(s): LIPASE, AMYLASE in the last 168 hours. No results for input(s): AMMONIA in the last 168 hours. Coagulation Profile: No results for input(s): INR, PROTIME in the last 168 hours. Cardiac Enzymes: No results for input(s): CKTOTAL, CKMB, CKMBINDEX, TROPONINI in the last 168 hours. BNP (last 3  results) No results for input(s): PROBNP in the last 8760 hours. HbA1C: No results for input(s): HGBA1C in the last 72 hours. CBG:  Recent Labs Lab 02/03/16 2219 02/04/16 0003  GLUCAP 210* 240*   Lipid Profile: No results for input(s): CHOL, HDL, LDLCALC, TRIG, CHOLHDL, LDLDIRECT in the last 72 hours. Thyroid Function Tests: No results for input(s): TSH, T4TOTAL, FREET4, T3FREE, THYROIDAB in the last 72 hours. Anemia Panel: No results for input(s): VITAMINB12, FOLATE, FERRITIN, TIBC, IRON, RETICCTPCT in the last 72 hours. Urine analysis: No results found for: COLORURINE, APPEARANCEUR, LABSPEC, PHURINE, GLUCOSEU,  HGBUR, BILIRUBINUR, KETONESUR, PROTEINUR, UROBILINOGEN, NITRITE, LEUKOCYTESUR Sepsis Labs: @LABRCNTIP (procalcitonin:4,lacticidven:4) ) Recent Results (from the past 240 hour(s))  Culture, blood (routine x 2)     Status: None (Preliminary result)   Collection Time: 02/03/16  3:10 PM  Result Value Ref Range Status   Specimen Description   Final    BLOOD RIGHT ANTECUBITAL Performed at [facility]    Special Requests BOTTLES DRAWN AEROBIC AND ANAEROBIC 10 CC EACH  Final   Culture PENDING  Incomplete   Report Status PENDING  Incomplete  Culture, blood (routine x 2)     Status: None (Preliminary result)   Collection Time: 02/03/16  4:30 PM  Result Value Ref Range Status   Specimen Description BLOOD LEFT HAND  Final   Special Requests   Final    BOTTLES DRAWN AEROBIC ONLY 4CC Performed at [facility]    Culture PENDING  Incomplete   Report Status PENDING  Incomplete     Radiological Exams on Admission: Ct Head Wo Contrast  Result Date: 02/03/2016 CLINICAL DATA:  60 year old female with right facial swelling and redness for 3 days. Possible fall. Initial encounter. EXAM: CT HEAD WITHOUT CONTRAST CT MAXILLOFACIAL WITHOUT CONTRAST TECHNIQUE: Multidetector CT imaging of the head and maxillofacial structures were performed using the standard protocol  without intravenous contrast. Multiplanar CT image reconstructions of the maxillofacial structures were also generated. COMPARISON:  [facility] noncontrast head CT 09/05/2005 FINDINGS: CT HEAD FINDINGS Brain: Mild generalized cerebral volume loss, but volume remains within normal limits for age. No midline shift, ventriculomegaly, mass effect, evidence of mass lesion, intracranial hemorrhage or evidence of cortically based acute infarction. Gray-white matter differentiation is within normal limits throughout the brain. Minimal to mild for age nonspecific white matter hypodensity. Vascular: No suspicious intracranial vascular hyperdensity. Skull: No acute osseous abnormality identified. Sinuses/Orbits: Visualized paranasal sinuses and mastoids are stable and well pneumatized. Other: No acute orbit or scalp soft tissue findings. CT MAXILLOFACIAL FINDINGS Osseous: Absent dentition. Mandible intact. Maxilla intact. No facial fracture identified. Advanced degenerative changes in the visible cervical spine including bulky anterior endplate osteophytosis. No acute osseous abnormality identified. Orbits: Negative. Sinuses: Clear. Soft tissues: Negative visualized noncontrast larynx, pharynx, parapharyngeal spaces, retropharyngeal space, sublingual space, submandibular glands and parotid glands. No fluid collection identified in the face or upper neck. Visible cervical and submandibular lymph nodes are normal. Limited intracranial: Reported above. IMPRESSION: 1. No acute intracranial abnormality and largely unremarkable for age noncontrast CT appearance of the brain. 2. No acute osseous abnormality or inflammatory process identified in the face. Absent dentition. Clear paranasal sinuses. 3. Cervical spine diffuse idiopathic skeletal hyperostosis. Electronically Signed   By: Odessa Fleming M.D.   On: 02/03/2016 16:13   Ct Maxillofacial Wo Contrast  Result Date: 02/03/2016 CLINICAL DATA:  60 year old female with  right facial swelling and redness for 3 days. Possible fall. Initial encounter. EXAM: CT HEAD WITHOUT CONTRAST CT MAXILLOFACIAL WITHOUT CONTRAST TECHNIQUE: Multidetector CT imaging of the head and maxillofacial structures were performed using the standard protocol without intravenous contrast. Multiplanar CT image reconstructions of the maxillofacial structures were also generated. COMPARISON:  [facility] noncontrast head CT 09/05/2005 FINDINGS: CT HEAD FINDINGS Brain: Mild generalized cerebral volume loss, but volume remains within normal limits for age. No midline shift, ventriculomegaly, mass effect, evidence of mass lesion, intracranial hemorrhage or evidence of cortically based acute infarction. Gray-white matter differentiation is within normal limits throughout the brain. Minimal to mild for age nonspecific white matter hypodensity.  Vascular: No suspicious intracranial vascular hyperdensity. Skull: No acute osseous abnormality identified. Sinuses/Orbits: Visualized paranasal sinuses and mastoids are stable and well pneumatized. Other: No acute orbit or scalp soft tissue findings. CT MAXILLOFACIAL FINDINGS Osseous: Absent dentition. Mandible intact. Maxilla intact. No facial fracture identified. Advanced degenerative changes in the visible cervical spine including bulky anterior endplate osteophytosis. No acute osseous abnormality identified. Orbits: Negative. Sinuses: Clear. Soft tissues: Negative visualized noncontrast larynx, pharynx, parapharyngeal spaces, retropharyngeal space, sublingual space, submandibular glands and parotid glands. No fluid collection identified in the face or upper neck. Visible cervical and submandibular lymph nodes are normal. Limited intracranial: Reported above. IMPRESSION: 1. No acute intracranial abnormality and largely unremarkable for age noncontrast CT appearance of the brain. 2. No acute osseous abnormality or inflammatory process identified in the face.  Absent dentition. Clear paranasal sinuses. 3. Cervical spine diffuse idiopathic skeletal hyperostosis. Electronically Signed   By: Odessa FlemingH  Hall M.D.   On: 02/03/2016 16:13     Assessment/Plan Principal Problem:   Facial cellulitis Active Problems:   Cellulitis   Diabetes mellitus type 2, controlled (HCC)   Normocytic normochromic anemia   Dementia   Bipolar I disorder (HCC)    1. Right facial cellulitis - since patient's symptoms are not improving on oral antibiotics patient is placed on IV antibiotics including vancomycin and Zosyn. Follow cultures. 2. Diabetes mellitus type 2 - patient is placed on sliding scale coverage. Hold metformin while inpatient. 3. History of dementia on Namenda and Aricept. 4. History of migraine on Topamax. 5. Chronic anemia - follow CBC.   DVT prophylaxis: SCDs. Code Status: Full code.  Family Communication: Discussed with patient.  Disposition Plan: Home.  Consults called: None.  Admission status: Inpatient. Likely stay 2 days.    Eduard ClosKAKRAKANDY,Claretha Townshend N. MD [facility] Pager [phone].  If 7PM-7AM, please contact night-coverage [email or web address] Password [email or web address]  02/04/2016, 2:41 AM

## 2016-02-04 NOTE — Progress Notes (Signed)
Inpatient Diabetes Program Recommendations  AACE/ADA: New Consensus Statement on Inpatient Glycemic Control (2015)  Target Ranges:  Prepandial:   less than 140 mg/dL      Peak postprandial:   less than 180 mg/dL (1-2 hours)      Critically ill patients:  140 - 180 mg/dL   Lab Results  Component Value Date   GLUCAP 173 (H) 02/04/2016   Results for Stephanie [facility], [facility] (MRN [id number]) as of 02/04/2016 14:51  Ref. Range 02/03/2016 22:19 02/04/2016 00:03 02/04/2016 06:01 02/04/2016 11:38  Glucose-Capillary Latest Ref Range: 65 - 99 mg/dL 409210 (H) 811240 (H) 914161 (H) 173 (H)    Post prandial blood sugars elevated despite current Novolog correction.    Consider increasing Novolog correction to moderate scale 0-15 units tid.  Susette RacerJulie Javontay Vandam, RN, BA, MHA, CDE Diabetes Coordinator Inpatient Diabetes Program  [phone] (Team Pager) [phone] [facility] Office) 02/04/2016 2:52 PM

## 2016-02-04 NOTE — ED Notes (Signed)
Jules Husbandsalled Hannah, RN to give update on pt status.

## 2016-02-05 DIAGNOSIS — F0390 Unspecified dementia without behavioral disturbance: Secondary | ICD-10-CM

## 2016-02-05 LAB — GLUCOSE, CAPILLARY
Glucose-Capillary: 207 mg/dL — ABNORMAL HIGH (ref 65–99)
Glucose-Capillary: 211 mg/dL — ABNORMAL HIGH (ref 65–99)
Glucose-Capillary: 136 mg/dL — ABNORMAL HIGH (ref 65–99)
Glucose-Capillary: 168 mg/dL — ABNORMAL HIGH (ref 65–99)

## 2016-02-05 LAB — COMPREHENSIVE METABOLIC PANEL
ALBUMIN: 2.9 g/dL — AB (ref 3.5–5.0)
ALK PHOS: 78 U/L (ref 38–126)
ALT: 29 U/L (ref 14–54)
ANION GAP: 13 (ref 5–15)
AST: 23 U/L (ref 15–41)
BILIRUBIN TOTAL: 0.5 mg/dL (ref 0.3–1.2)
BUN: <5 mg/dL — ABNORMAL LOW (ref 6–20)
CALCIUM: 8.8 mg/dL — AB (ref 8.9–10.3)
CO2: 22 mmol/L (ref 22–32)
CREATININE: 0.68 mg/dL (ref 0.44–1.00)
Chloride: 105 mmol/L (ref 101–111)
GFR calc non Af Amer: >60 mL/min (ref >60)
GLUCOSE: 191 mg/dL — AB (ref 65–99)
Potassium: 3.3 mmol/L — ABNORMAL LOW (ref 3.5–5.1)
Sodium: 140 mmol/L (ref 135–145)
TOTAL PROTEIN: 6.6 g/dL (ref 6.5–8.1)

## 2016-02-05 LAB — CBC
HEMATOCRIT: 35.1 % — AB (ref 36.0–46.0)
HEMOGLOBIN: 10.8 g/dL — AB (ref 12.0–15.0)
MCH: 28.1 pg (ref 26.0–34.0)
MCHC: 30.8 g/dL (ref 30.0–36.0)
MCV: 91.2 fL (ref 78.0–100.0)
Platelets: 237 10*3/uL (ref 150–400)
RBC: 3.85 MIL/uL — ABNORMAL LOW (ref 3.87–5.11)
RDW: 14.3 % (ref 11.5–15.5)
WBC: 7.3 10*3/uL (ref 4.0–10.5)

## 2016-02-05 LAB — HEMOGLOBIN A1C
HEMOGLOBIN A1C: 6.2 % — AB (ref 4.8–5.6)
Mean Plasma Glucose: 131 mg/dL

## 2016-02-05 MED ORDER — BISACODYL 10 MG RE SUPP
10.0000 mg | Freq: Once | RECTAL | Status: DC
  Filled 2016-02-05: qty 1

## 2016-02-05 NOTE — Progress Notes (Addendum)
[facility] PROGRESS NOTE  Stephanie Rice ZOX:[id number] DOB: 1955-10-27 DOA: 02/03/2016   PCP: No primary care provider on file.     Assessment/Plan: Principal Problem:   Facial cellulitis Active Problems:   Cellulitis   Diabetes mellitus type 2, controlled (HCC)   Normocytic normochromic anemia   Dementia   Bipolar I disorder (HCC)      60 y.o.femalewith diabetes mellitus type 2, dementia, COPD was brought to the ER because of worsening right facial erythema swelling and pain. Patient has been taking oral antibiotics for last 2 days despite which patient's swelling did not improve and was worsening. On exam patient has right facial erythema involving the right maxillary and right periorbital area. CT scan does not show any involvement of the bone or orbits. Patient has been admitted for IV antibiotics. On exam patient is able to move eyes without difficulty.   Assessment and plan 1. Right facial cellulitis-mostly periorbital,  patient's symptoms   improving  Cont IV antibiotics including vancomycin and Zosyn. Follow cultures. Improving  2. Diabetes mellitus type 2- patient is placed on sliding scale coverage. Hold metformin while inpatient.   hemoglobin A1c 6.2 3. History of dementia on Namenda and Aricept. 4. History of migraineon Topamax. 5. Chronic anemia - follow CBC. 6. Abdominal distension: belly distended with fluid shift,   CT abdomen and pelvis Stable adrenal nodules, no mass, or obstruction 7. Discussed with daughter Zehra, Rucci  DVT prophylaxsis lovenox   Code Status:  Full code     Family Communication: Discussed in detail with the patient, all imaging results, lab results explained to the patient   Disposition Plan:  Dc in am      Consultants:  None   Procedures:  None   Antibiotics: Anti-infectives    Start     Dose/Rate Route Frequency Ordered Stop   02/04/16 1400  piperacillin-tazobactam (ZOSYN) IVPB 3.375 g     3.375  g 12.5 mL/hr over 240 Minutes Intravenous Every 8 hours 02/04/16 0245     02/04/16 0700  vancomycin (VANCOCIN) IVPB 750 mg/150 ml premix     750 mg 150 mL/hr over 60 Minutes Intravenous Every 12 hours 02/03/16 1614      HPI/Subjective: Facial redness is much better   Objective: Vitals:   02/05/16 0036 02/05/16 0517 02/05/16 0748 02/05/16 1004  BP: 139/79 (!) 141/76  139/69  Pulse: 73 71  76  Resp: 20 18  20   Temp: 97.6 F (36.4 C) 97.4 F (36.3 C)  98.1 F (36.7 C)  TempSrc: Oral Oral  Oral  SpO2: 98% 97% 95% 99%  Weight:      Height:        Intake/Output Summary (Last 24 hours) at 02/05/16 1259 Last data filed at 02/05/16 0300  Gross per 24 hour  Intake           2107.5 ml  Output                0 ml  Net           2107.5 ml    Exam:  Examination:  General exam: Appears calm and comfortable  Respiratory system: Clear to auscultation. Respiratory effort normal. Cardiovascular system: S1 & S2 heard, RRR. No JVD, murmurs, rubs, gallops or clicks. No pedal edema. Gastrointestinal system: Abdomen is nondistended, soft and nontender. No organomegaly or masses felt. Normal bowel sounds heard. Central nervous system: Alert and oriented. No focal neurological deficits. Extremities: Symmetric 5 x  5 power. Skin: No rashes, lesions or ulcers Psychiatry: Judgement and insight appear normal. Mood & affect appropriate.     Data Reviewed: I have personally reviewed following labs and imaging studies  Micro Results Recent Results (from the past 240 hour(s))  Culture, blood (routine x 2)     Status: None (Preliminary result)   Collection Time: 02/03/16  3:10 PM  Result Value Ref Range Status   Specimen Description BLOOD RIGHT ANTECUBITAL  Final   Special Requests BOTTLES DRAWN AEROBIC AND ANAEROBIC 10 CC EACH  Final   Culture   Final    NO GROWTH < 24 HOURS [facility]    Report Status PENDING  Incomplete  Culture, blood (routine x 2)     Status: None  (Preliminary result)   Collection Time: 02/03/16  4:30 PM  Result Value Ref Range Status   Specimen Description BLOOD LEFT HAND  Final   Special Requests BOTTLES DRAWN AEROBIC ONLY 4CC  Final   Culture   Final    NO GROWTH < 24 HOURS [facility]    Report Status PENDING  Incomplete    Radiology Reports Ct Abdomen Pelvis Wo Contrast  Result Date: 02/04/2016 CLINICAL DATA:  Abdominal distention, constipation, dementia, diabetes mellitus, GERD, former smoker, COPD EXAM: CT ABDOMEN AND PELVIS WITHOUT CONTRAST TECHNIQUE: Multidetector CT imaging of the abdomen and pelvis was performed following the standard protocol without IV contrast. Sagittal and coronal MPR images reconstructed from axial data set. COMPARISON:  11/18/2015 FINDINGS: Lower chest: Lung bases clear Hepatobiliary: Liver and gallbladder normal appearance Pancreas: Partial fatty replacement.  Otherwise normal appearance Spleen: Normal appearance Adrenals/Urinary Tract: LEFT adrenal nodule 17 x 14 mm image 30 previously 16 x 15 mm. RIGHT adrenal nodule 14 x 10 mm unchanged. Kidneys, ureters, and bladder normal appearance. Stomach/Bowel: Normal appendix. Gaseous distention of the ascending colon with gas throughout remainder of colon to rectum as well. Stomach and bowel loops otherwise normal appearance. No definite bowel wall thickening or evidence of obstruction. Vascular/Lymphatic: Mild scattered atherosclerotic calcifications without aortic aneurysm. No adenopathy. Normal sized periportal and peripancreatic nodes. Reproductive: Unremarkable uterus and adnexa Other: No free air or free fluid.  No hernia. Musculoskeletal: Degenerative disc disease changes thoracolumbar spine. No acute bone lesions. IMPRESSION: Stable adrenal nodules. Aortic atherosclerosis. Slight gaseous distention of the ascending colon without evidence of bowel obstruction or wall thickening. Remainder of exam unremarkable. Electronically Signed   By:  Ulyses SouthwardMark  Boles M.D.   On: 02/04/2016 17:12   Ct Head Wo Contrast  Result Date: 02/03/2016 CLINICAL DATA:  60 year old female with right facial swelling and redness for 3 days. Possible fall. Initial encounter. EXAM: CT HEAD WITHOUT CONTRAST CT MAXILLOFACIAL WITHOUT CONTRAST TECHNIQUE: Multidetector CT imaging of the head and maxillofacial structures were performed using the standard protocol without intravenous contrast. Multiplanar CT image reconstructions of the maxillofacial structures were also generated. COMPARISON:  [facility] noncontrast head CT 09/05/2005 FINDINGS: CT HEAD FINDINGS Brain: Mild generalized cerebral volume loss, but volume remains within normal limits for age. No midline shift, ventriculomegaly, mass effect, evidence of mass lesion, intracranial hemorrhage or evidence of cortically based acute infarction. Gray-white matter differentiation is within normal limits throughout the brain. Minimal to mild for age nonspecific white matter hypodensity. Vascular: No suspicious intracranial vascular hyperdensity. Skull: No acute osseous abnormality identified. Sinuses/Orbits: Visualized paranasal sinuses and mastoids are stable and well pneumatized. Other: No acute orbit or scalp soft tissue findings. CT MAXILLOFACIAL FINDINGS  Osseous: Absent dentition. Mandible intact. Maxilla intact. No facial fracture identified. Advanced degenerative changes in the visible cervical spine including bulky anterior endplate osteophytosis. No acute osseous abnormality identified. Orbits: Negative. Sinuses: Clear. Soft tissues: Negative visualized noncontrast larynx, pharynx, parapharyngeal spaces, retropharyngeal space, sublingual space, submandibular glands and parotid glands. No fluid collection identified in the face or upper neck. Visible cervical and submandibular lymph nodes are normal. Limited intracranial: Reported above. IMPRESSION: 1. No acute intracranial abnormality and largely  unremarkable for age noncontrast CT appearance of the brain. 2. No acute osseous abnormality or inflammatory process identified in the face. Absent dentition. Clear paranasal sinuses. 3. Cervical spine diffuse idiopathic skeletal hyperostosis. Electronically Signed   By: Odessa Fleming M.D.   On: 02/03/2016 16:13   Ct Maxillofacial Wo Contrast  Result Date: 02/03/2016 CLINICAL DATA:  60 year old female with right facial swelling and redness for 3 days. Possible fall. Initial encounter. EXAM: CT HEAD WITHOUT CONTRAST CT MAXILLOFACIAL WITHOUT CONTRAST TECHNIQUE: Multidetector CT imaging of the head and maxillofacial structures were performed using the standard protocol without intravenous contrast. Multiplanar CT image reconstructions of the maxillofacial structures were also generated. COMPARISON:  [facility] noncontrast head CT 09/05/2005 FINDINGS: CT HEAD FINDINGS Brain: Mild generalized cerebral volume loss, but volume remains within normal limits for age. No midline shift, ventriculomegaly, mass effect, evidence of mass lesion, intracranial hemorrhage or evidence of cortically based acute infarction. Gray-white matter differentiation is within normal limits throughout the brain. Minimal to mild for age nonspecific white matter hypodensity. Vascular: No suspicious intracranial vascular hyperdensity. Skull: No acute osseous abnormality identified. Sinuses/Orbits: Visualized paranasal sinuses and mastoids are stable and well pneumatized. Other: No acute orbit or scalp soft tissue findings. CT MAXILLOFACIAL FINDINGS Osseous: Absent dentition. Mandible intact. Maxilla intact. No facial fracture identified. Advanced degenerative changes in the visible cervical spine including bulky anterior endplate osteophytosis. No acute osseous abnormality identified. Orbits: Negative. Sinuses: Clear. Soft tissues: Negative visualized noncontrast larynx, pharynx, parapharyngeal spaces, retropharyngeal space,  sublingual space, submandibular glands and parotid glands. No fluid collection identified in the face or upper neck. Visible cervical and submandibular lymph nodes are normal. Limited intracranial: Reported above. IMPRESSION: 1. No acute intracranial abnormality and largely unremarkable for age noncontrast CT appearance of the brain. 2. No acute osseous abnormality or inflammatory process identified in the face. Absent dentition. Clear paranasal sinuses. 3. Cervical spine diffuse idiopathic skeletal hyperostosis. Electronically Signed   By: Odessa Fleming M.D.   On: 02/03/2016 16:13     CBC  Recent Labs Lab 02/03/16 1510 02/04/16 0541 02/05/16 0346  WBC 6.4 7.1 7.3  HGB 10.8* 11.4* 10.8*  HCT 33.3* 36.3 35.1*  PLT 182 208 237  MCV 89.5 89.2 91.2  MCH 29.0 28.0 28.1  MCHC 32.4 31.4 30.8  RDW 14.0 13.9 14.3  LYMPHSABS 1.0 1.2  --   MONOABS 0.7 0.8  --   EOSABS 0.2 0.2  --   BASOSABS 0.0 0.0  --     Chemistries   Recent Labs Lab 02/03/16 1510 02/04/16 0541 02/05/16 0346  NA 130* 142 140  K 3.8 3.8 3.3*  CL 96* 106 105  CO2 25 24 22   GLUCOSE 234* 169* 191*  BUN 5* <5* <5*  CREATININE 0.42* 0.51 0.68  CALCIUM 8.5* 9.2 8.8*  AST 18 18 23   ALT 23 25 29   ALKPHOS 78 86 78  BILITOT 0.3 0.5 0.5   ------------------------------------------------------------------------------------------------------------------ estimated creatinine clearance is 84.6 mL/min (by C-G formula based on SCr of  0.68 mg/dL). ------------------------------------------------------------------------------------------------------------------  Recent Labs  02/04/16 1021  HGBA1C 6.2*   ------------------------------------------------------------------------------------------------------------------ No results for input(s): CHOL, HDL, LDLCALC, TRIG, CHOLHDL, LDLDIRECT in the last 72 hours. ------------------------------------------------------------------------------------------------------------------ No  results for input(s): TSH, T4TOTAL, T3FREE, THYROIDAB in the last 72 hours.  Invalid input(s): FREET3 ------------------------------------------------------------------------------------------------------------------ No results for input(s): VITAMINB12, FOLATE, FERRITIN, TIBC, IRON, RETICCTPCT in the last 72 hours.  Coagulation profile No results for input(s): INR, PROTIME in the last 168 hours.  No results for input(s): DDIMER in the last 72 hours.  Cardiac Enzymes No results for input(s): CKMB, TROPONINI, MYOGLOBIN in the last 168 hours.  Invalid input(s): CK ------------------------------------------------------------------------------------------------------------------ Invalid input(s): POCBNP   CBG:  Recent Labs Lab 02/04/16 1138 02/04/16 1643 02/04/16 2107 02/05/16 0619 02/05/16 1139  GLUCAP 173* 148* 200* 207* 211*       Studies: Ct Abdomen Pelvis Wo Contrast  Result Date: 02/04/2016 CLINICAL DATA:  Abdominal distention, constipation, dementia, diabetes mellitus, GERD, former smoker, COPD EXAM: CT ABDOMEN AND PELVIS WITHOUT CONTRAST TECHNIQUE: Multidetector CT imaging of the abdomen and pelvis was performed following the standard protocol without IV contrast. Sagittal and coronal MPR images reconstructed from axial data set. COMPARISON:  11/18/2015 FINDINGS: Lower chest: Lung bases clear Hepatobiliary: Liver and gallbladder normal appearance Pancreas: Partial fatty replacement.  Otherwise normal appearance Spleen: Normal appearance Adrenals/Urinary Tract: LEFT adrenal nodule 17 x 14 mm image 30 previously 16 x 15 mm. RIGHT adrenal nodule 14 x 10 mm unchanged. Kidneys, ureters, and bladder normal appearance. Stomach/Bowel: Normal appendix. Gaseous distention of the ascending colon with gas throughout remainder of colon to rectum as well. Stomach and bowel loops otherwise normal appearance. No definite bowel wall thickening or evidence of obstruction. Vascular/Lymphatic:  Mild scattered atherosclerotic calcifications without aortic aneurysm. No adenopathy. Normal sized periportal and peripancreatic nodes. Reproductive: Unremarkable uterus and adnexa Other: No free air or free fluid.  No hernia. Musculoskeletal: Degenerative disc disease changes thoracolumbar spine. No acute bone lesions. IMPRESSION: Stable adrenal nodules. Aortic atherosclerosis. Slight gaseous distention of the ascending colon without evidence of bowel obstruction or wall thickening. Remainder of exam unremarkable. Electronically Signed   By: Ulyses Southward M.D.   On: 02/04/2016 17:12   Ct Head Wo Contrast  Result Date: 02/03/2016 CLINICAL DATA:  60 year old female with right facial swelling and redness for 3 days. Possible fall. Initial encounter. EXAM: CT HEAD WITHOUT CONTRAST CT MAXILLOFACIAL WITHOUT CONTRAST TECHNIQUE: Multidetector CT imaging of the head and maxillofacial structures were performed using the standard protocol without intravenous contrast. Multiplanar CT image reconstructions of the maxillofacial structures were also generated. COMPARISON:  [facility] noncontrast head CT 09/05/2005 FINDINGS: CT HEAD FINDINGS Brain: Mild generalized cerebral volume loss, but volume remains within normal limits for age. No midline shift, ventriculomegaly, mass effect, evidence of mass lesion, intracranial hemorrhage or evidence of cortically based acute infarction. Gray-white matter differentiation is within normal limits throughout the brain. Minimal to mild for age nonspecific white matter hypodensity. Vascular: No suspicious intracranial vascular hyperdensity. Skull: No acute osseous abnormality identified. Sinuses/Orbits: Visualized paranasal sinuses and mastoids are stable and well pneumatized. Other: No acute orbit or scalp soft tissue findings. CT MAXILLOFACIAL FINDINGS Osseous: Absent dentition. Mandible intact. Maxilla intact. No facial fracture identified. Advanced degenerative changes  in the visible cervical spine including bulky anterior endplate osteophytosis. No acute osseous abnormality identified. Orbits: Negative. Sinuses: Clear. Soft tissues: Negative visualized noncontrast larynx, pharynx, parapharyngeal spaces, retropharyngeal space, sublingual space, submandibular glands and parotid glands. No fluid collection identified in the  face or upper neck. Visible cervical and submandibular lymph nodes are normal. Limited intracranial: Reported above. IMPRESSION: 1. No acute intracranial abnormality and largely unremarkable for age noncontrast CT appearance of the brain. 2. No acute osseous abnormality or inflammatory process identified in the face. Absent dentition. Clear paranasal sinuses. 3. Cervical spine diffuse idiopathic skeletal hyperostosis. Electronically Signed   By: Odessa Fleming M.D.   On: 02/03/2016 16:13   Ct Maxillofacial Wo Contrast  Result Date: 02/03/2016 CLINICAL DATA:  60 year old female with right facial swelling and redness for 3 days. Possible fall. Initial encounter. EXAM: CT HEAD WITHOUT CONTRAST CT MAXILLOFACIAL WITHOUT CONTRAST TECHNIQUE: Multidetector CT imaging of the head and maxillofacial structures were performed using the standard protocol without intravenous contrast. Multiplanar CT image reconstructions of the maxillofacial structures were also generated. COMPARISON:  [facility] noncontrast head CT 09/05/2005 FINDINGS: CT HEAD FINDINGS Brain: Mild generalized cerebral volume loss, but volume remains within normal limits for age. No midline shift, ventriculomegaly, mass effect, evidence of mass lesion, intracranial hemorrhage or evidence of cortically based acute infarction. Gray-white matter differentiation is within normal limits throughout the brain. Minimal to mild for age nonspecific white matter hypodensity. Vascular: No suspicious intracranial vascular hyperdensity. Skull: No acute osseous abnormality identified. Sinuses/Orbits:  Visualized paranasal sinuses and mastoids are stable and well pneumatized. Other: No acute orbit or scalp soft tissue findings. CT MAXILLOFACIAL FINDINGS Osseous: Absent dentition. Mandible intact. Maxilla intact. No facial fracture identified. Advanced degenerative changes in the visible cervical spine including bulky anterior endplate osteophytosis. No acute osseous abnormality identified. Orbits: Negative. Sinuses: Clear. Soft tissues: Negative visualized noncontrast larynx, pharynx, parapharyngeal spaces, retropharyngeal space, sublingual space, submandibular glands and parotid glands. No fluid collection identified in the face or upper neck. Visible cervical and submandibular lymph nodes are normal. Limited intracranial: Reported above. IMPRESSION: 1. No acute intracranial abnormality and largely unremarkable for age noncontrast CT appearance of the brain. 2. No acute osseous abnormality or inflammatory process identified in the face. Absent dentition. Clear paranasal sinuses. 3. Cervical spine diffuse idiopathic skeletal hyperostosis. Electronically Signed   By: Odessa Fleming M.D.   On: 02/03/2016 16:13      Lab Results  Component Value Date   HGBA1C 6.2 (H) 02/04/2016   Lab Results  Component Value Date   CREATININE 0.68 02/05/2016       Scheduled Meds: . bisacodyl  10 mg Rectal Once  . memantine  28 mg Oral QHS   And  . donepezil  10 mg Oral QHS  . enoxaparin (LOVENOX) injection  55 mg Subcutaneous Q24H  . fluticasone furoate-vilanterol  1 puff Inhalation Daily  . gabapentin  300 mg Oral TID  . insulin aspart  0-9 Units Subcutaneous TID WC  . loxapine  20 mg Oral QHS  . metoCLOPramide  10 mg Oral TID AC & HS  . montelukast  10 mg Oral QHS  . niacin  50 mg Oral QHS  . pantoprazole  40 mg Oral Daily  . piperacillin-tazobactam (ZOSYN)  IV  3.375 g Intravenous Q8H  . potassium chloride  10 mEq Oral Daily  . primidone  50 mg Oral q morning - 10a  . sertraline  150 mg Oral QHS  .  topiramate  100 mg Oral Daily  . topiramate  200 mg Oral QHS  . vancomycin  750 mg Intravenous Q12H   Continuous Infusions:    LOS: 2 days    Time spent: >30 MINS    [facility]  [facility]  Pager [phone]. If 7PM-7AM, please contact night-coverage at [email or web address], password [facility] 02/05/2016, 12:59 PM  LOS: 2 days

## 2016-02-05 NOTE — Care Management Note (Signed)
Case Management Note  Patient Details  Name: Stephanie Rice MRN: [id number] Date of Birth: 04-24-1956  Subjective/Objective:     Pt in with facial cellulitis. She is from home.               Action/Plan: Pt continues to receive IV antibiotics. CM following for d/c needs.   Expected Discharge Date:                  Expected Discharge Plan:  Home/Self Care  In-House Referral:     Discharge planning Services     Post Acute Care Choice:    Choice offered to:     DME Arranged:    DME Agency:     HH Arranged:    HH Agency:     Status of Service:  In process, will continue to follow  If discussed at Long Length of Stay Meetings, dates discussed:    Additional Comments:  [name], RN 02/05/2016, 11:08 AM

## 2016-02-06 DIAGNOSIS — R140 Abdominal distension (gaseous): Secondary | ICD-10-CM

## 2016-02-06 LAB — GLUCOSE, CAPILLARY
GLUCOSE-CAPILLARY: 235 mg/dL — AB (ref 65–99)
GLUCOSE-CAPILLARY: 144 mg/dL — AB (ref 65–99)
GLUCOSE-CAPILLARY: 213 mg/dL — AB (ref 65–99)
Glucose-Capillary: 211 mg/dL — ABNORMAL HIGH (ref 65–99)

## 2016-02-06 LAB — MAGNESIUM: Magnesium: 1.4 mg/dL — ABNORMAL LOW (ref 1.7–2.4)

## 2016-02-06 MED ORDER — CIPROFLOXACIN HCL 500 MG PO TABS
500.0000 mg | ORAL_TABLET | Freq: Two times a day (BID) | ORAL | Status: DC
Start: 2016-02-06 — End: 2016-02-07
  Administered 2016-02-06 – 2016-02-07 (×3): 500 mg via ORAL
  Filled 2016-02-06 (×3): qty 1

## 2016-02-06 MED ORDER — SULFAMETHOXAZOLE-TRIMETHOPRIM 800-160 MG PO TABS
1.0000 | ORAL_TABLET | Freq: Two times a day (BID) | ORAL | Status: DC
  Administered 2016-02-06 – 2016-02-07 (×3): 1 via ORAL
  Filled 2016-02-06 (×3): qty 1

## 2016-02-06 MED ORDER — INSULIN DETEMIR 100 UNIT/ML ~~LOC~~ SOLN
25.0000 [IU] | Freq: Every day | SUBCUTANEOUS | Status: DC
  Administered 2016-02-06 – 2016-02-07 (×2): 25 [IU] via SUBCUTANEOUS
  Filled 2016-02-06 (×3): qty 0.25

## 2016-02-06 MED ORDER — POTASSIUM CHLORIDE CRYS ER 20 MEQ PO TBCR
40.0000 meq | EXTENDED_RELEASE_TABLET | Freq: Once | ORAL | Status: AC
  Administered 2016-02-06: 40 meq via ORAL
  Filled 2016-02-06: qty 2

## 2016-02-06 NOTE — Progress Notes (Signed)
Walked pt to BR at 5pm and 6:30pm, patient voided each time. Back to chair with chair alarm set

## 2016-02-06 NOTE — Progress Notes (Addendum)
[facility] PROGRESS NOTE  Stephanie Rice ZOX:[id number] DOB: March 03, 1956 DOA: 02/03/2016   PCP: No primary care provider on file.     Assessment/Plan: Principal Problem:   Facial cellulitis Active Problems:   Cellulitis   Diabetes mellitus type 2, controlled (HCC)   Normocytic normochromic anemia   Dementia   Bipolar I disorder (HCC)   Abdominal distention      60 y.o.femalewith diabetes mellitus type 2, dementia, COPD was brought to the ER because of worsening right facial erythema swelling and pain. Patient has been taking oral antibiotics for last 2 days despite which patient's swelling did not improve and was worsening. On exam patient has right facial erythema involving the right maxillary and right periorbital area. CT scan does not show any involvement of the bone or orbits. Patient has been admitted for IV antibiotics. On exam patient is able to move eyes without difficulty.   Assessment and plan 1. Right facial cellulitis-mostly periorbital,  patient's symptoms   improving , Completed  IV antibiotics including vancomycin and Zosyn. Ciprofloxacin allergy is not specified, therefore the patient has been initiated on ciprofloxacin and Bactrim combination to see if these can be continued in the outpatient setting. If no adverse reaction to either one , continue these antibiotics for another 7 days, patient is improving. No growth from blood culture on 9/20. 2. Diabetes mellitus type 2- uncontrolled, patient is placed on sliding scale coverage. Hold metformin while inpatient.   hemoglobin A1c 6.2. Patient has been started on Levemir. Diabetes coordinator consult for Levemir/insulin teaching. Daughter states she takes levemir at home,  3. History of dementia on Namenda and Aricept. 4. History of migraineon Topamax. 5. Chronic anemia - follow CBC. 6. Abdominal distension: belly distended with fluid shift,   CT abdomen and pelvis Stable adrenal nodules, no mass,  or obstruction. Patient had a bowel movement yesterday.Has a hx of gastroparesis?motility disorder, will need to see GI as outpatient 7. OSA-noncompliant with CPAP, has 2L aof o2 at baseline   DVT prophylaxsis lovenox   Code Status:  Full code     Family Communication: Discussed in detail with the patient, all imaging results, lab results explained to the patient   Disposition Plan:  Pediatric consultation, anticipate discharge tomorrow    Consultants:  None   Procedures:  None   Antibiotics:  Vancomycin/Zosyn 9/22 9/23 Ciprofloxacin and Bactrim  -9/23-   Subjective Facial redness is much better   Objective: Vitals:   02/05/16 2031 02/06/16 0200 02/06/16 0600 02/06/16 0944  BP: (!) 154/80 (!) 149/81 134/66 137/64  Pulse: 77 83 81 80  Resp: 20 18 20 19   Temp: 98.1 F (36.7 C) 97.9 F (36.6 C) 98.1 F (36.7 C) 98.3 F (36.8 C)  TempSrc: Oral Oral Oral Oral  SpO2: 99% 93% 96% 100%  Weight:      Height:        Intake/Output Summary (Last 24 hours) at 02/06/16 1044 Last data filed at 02/05/16 1500  Gross per 24 hour  Intake              860 ml  Output                0 ml  Net              860 ml    Exam:  Examination:  General exam: Appears calm and comfortable  Respiratory system: Clear to auscultation. Respiratory effort normal. Cardiovascular system: S1 & S2 heard,  RRR. No JVD, murmurs, rubs, gallops or clicks. No pedal edema. Gastrointestinal system: Abdomen is nondistended, soft and nontender. No organomegaly or masses felt. Normal bowel sounds heard. Central nervous system: Alert and oriented. No focal neurological deficits. Extremities: Symmetric 5 x 5 power. Skin: No rashes, lesions or ulcers Psychiatry: Judgement and insight appear normal. Mood & affect appropriate.     Data Reviewed: I have personally reviewed following labs and imaging studies  Micro Results Recent Results (from the past 240 hour(s))  Culture, blood (routine x 2)      Status: None (Preliminary result)   Collection Time: 02/03/16  3:10 PM  Result Value Ref Range Status   Specimen Description BLOOD RIGHT ANTECUBITAL  Final   Special Requests BOTTLES DRAWN AEROBIC AND ANAEROBIC 10 CC EACH  Final   Culture   Final    NO GROWTH 2 DAYS Performed at [facility]    Report Status PENDING  Incomplete  Culture, blood (routine x 2)     Status: None (Preliminary result)   Collection Time: 02/03/16  4:30 PM  Result Value Ref Range Status   Specimen Description BLOOD LEFT HAND  Final   Special Requests BOTTLES DRAWN AEROBIC ONLY 4CC  Final   Culture   Final    NO GROWTH 2 DAYS Performed at [facility]    Report Status PENDING  Incomplete    Radiology Reports Ct Abdomen Pelvis Wo Contrast  Result Date: 02/04/2016 CLINICAL DATA:  Abdominal distention, constipation, dementia, diabetes mellitus, GERD, former smoker, COPD EXAM: CT ABDOMEN AND PELVIS WITHOUT CONTRAST TECHNIQUE: Multidetector CT imaging of the abdomen and pelvis was performed following the standard protocol without IV contrast. Sagittal and coronal MPR images reconstructed from axial data set. COMPARISON:  11/18/2015 FINDINGS: Lower chest: Lung bases clear Hepatobiliary: Liver and gallbladder normal appearance Pancreas: Partial fatty replacement.  Otherwise normal appearance Spleen: Normal appearance Adrenals/Urinary Tract: LEFT adrenal nodule 17 x 14 mm image 30 previously 16 x 15 mm. RIGHT adrenal nodule 14 x 10 mm unchanged. Kidneys, ureters, and bladder normal appearance. Stomach/Bowel: Normal appendix. Gaseous distention of the ascending colon with gas throughout remainder of colon to rectum as well. Stomach and bowel loops otherwise normal appearance. No definite bowel wall thickening or evidence of obstruction. Vascular/Lymphatic: Mild scattered atherosclerotic calcifications without aortic aneurysm. No adenopathy. Normal sized periportal and peripancreatic nodes. Reproductive:  Unremarkable uterus and adnexa Other: No free air or free fluid.  No hernia. Musculoskeletal: Degenerative disc disease changes thoracolumbar spine. No acute bone lesions. IMPRESSION: Stable adrenal nodules. Aortic atherosclerosis. Slight gaseous distention of the ascending colon without evidence of bowel obstruction or wall thickening. Remainder of exam unremarkable. Electronically Signed   By: Ulyses Southward M.D.   On: 02/04/2016 17:12   Ct Head Wo Contrast  Result Date: 02/03/2016 CLINICAL DATA:  60 year old female with right facial swelling and redness for 3 days. Possible fall. Initial encounter. EXAM: CT HEAD WITHOUT CONTRAST CT MAXILLOFACIAL WITHOUT CONTRAST TECHNIQUE: Multidetector CT imaging of the head and maxillofacial structures were performed using the standard protocol without intravenous contrast. Multiplanar CT image reconstructions of the maxillofacial structures were also generated. COMPARISON:  [facility] noncontrast head CT 09/05/2005 FINDINGS: CT HEAD FINDINGS Brain: Mild generalized cerebral volume loss, but volume remains within normal limits for age. No midline shift, ventriculomegaly, mass effect, evidence of mass lesion, intracranial hemorrhage or evidence of cortically based acute infarction. Gray-white matter differentiation is within normal limits throughout the brain. Minimal to mild for  age nonspecific white matter hypodensity. Vascular: No suspicious intracranial vascular hyperdensity. Skull: No acute osseous abnormality identified. Sinuses/Orbits: Visualized paranasal sinuses and mastoids are stable and well pneumatized. Other: No acute orbit or scalp soft tissue findings. CT MAXILLOFACIAL FINDINGS Osseous: Absent dentition. Mandible intact. Maxilla intact. No facial fracture identified. Advanced degenerative changes in the visible cervical spine including bulky anterior endplate osteophytosis. No acute osseous abnormality identified. Orbits: Negative. Sinuses:  Clear. Soft tissues: Negative visualized noncontrast larynx, pharynx, parapharyngeal spaces, retropharyngeal space, sublingual space, submandibular glands and parotid glands. No fluid collection identified in the face or upper neck. Visible cervical and submandibular lymph nodes are normal. Limited intracranial: Reported above. IMPRESSION: 1. No acute intracranial abnormality and largely unremarkable for age noncontrast CT appearance of the brain. 2. No acute osseous abnormality or inflammatory process identified in the face. Absent dentition. Clear paranasal sinuses. 3. Cervical spine diffuse idiopathic skeletal hyperostosis. Electronically Signed   By: Odessa Fleming M.D.   On: 02/03/2016 16:13   Ct Maxillofacial Wo Contrast  Result Date: 02/03/2016 CLINICAL DATA:  60 year old female with right facial swelling and redness for 3 days. Possible fall. Initial encounter. EXAM: CT HEAD WITHOUT CONTRAST CT MAXILLOFACIAL WITHOUT CONTRAST TECHNIQUE: Multidetector CT imaging of the head and maxillofacial structures were performed using the standard protocol without intravenous contrast. Multiplanar CT image reconstructions of the maxillofacial structures were also generated. COMPARISON:  [facility] noncontrast head CT 09/05/2005 FINDINGS: CT HEAD FINDINGS Brain: Mild generalized cerebral volume loss, but volume remains within normal limits for age. No midline shift, ventriculomegaly, mass effect, evidence of mass lesion, intracranial hemorrhage or evidence of cortically based acute infarction. Gray-white matter differentiation is within normal limits throughout the brain. Minimal to mild for age nonspecific white matter hypodensity. Vascular: No suspicious intracranial vascular hyperdensity. Skull: No acute osseous abnormality identified. Sinuses/Orbits: Visualized paranasal sinuses and mastoids are stable and well pneumatized. Other: No acute orbit or scalp soft tissue findings. CT MAXILLOFACIAL FINDINGS  Osseous: Absent dentition. Mandible intact. Maxilla intact. No facial fracture identified. Advanced degenerative changes in the visible cervical spine including bulky anterior endplate osteophytosis. No acute osseous abnormality identified. Orbits: Negative. Sinuses: Clear. Soft tissues: Negative visualized noncontrast larynx, pharynx, parapharyngeal spaces, retropharyngeal space, sublingual space, submandibular glands and parotid glands. No fluid collection identified in the face or upper neck. Visible cervical and submandibular lymph nodes are normal. Limited intracranial: Reported above. IMPRESSION: 1. No acute intracranial abnormality and largely unremarkable for age noncontrast CT appearance of the brain. 2. No acute osseous abnormality or inflammatory process identified in the face. Absent dentition. Clear paranasal sinuses. 3. Cervical spine diffuse idiopathic skeletal hyperostosis. Electronically Signed   By: Odessa Fleming M.D.   On: 02/03/2016 16:13     CBC  Recent Labs Lab 02/03/16 1510 02/04/16 0541 02/05/16 0346  WBC 6.4 7.1 7.3  HGB 10.8* 11.4* 10.8*  HCT 33.3* 36.3 35.1*  PLT 182 208 237  MCV 89.5 89.2 91.2  MCH 29.0 28.0 28.1  MCHC 32.4 31.4 30.8  RDW 14.0 13.9 14.3  LYMPHSABS 1.0 1.2  --   MONOABS 0.7 0.8  --   EOSABS 0.2 0.2  --   BASOSABS 0.0 0.0  --     Chemistries   Recent Labs Lab 02/03/16 1510 02/04/16 0541 02/05/16 0346  NA 130* 142 140  K 3.8 3.8 3.3*  CL 96* 106 105  CO2 25 24 22   GLUCOSE 234* 169* 191*  BUN 5* <5* <5*  CREATININE 0.42* 0.51 0.68  CALCIUM 8.5* 9.2 8.8*  AST 18 18 23   ALT 23 25 29   ALKPHOS 78 86 78  BILITOT 0.3 0.5 0.5   ------------------------------------------------------------------------------------------------------------------ estimated creatinine clearance is 84.6 mL/min (by C-G formula based on SCr of 0.68  mg/dL). ------------------------------------------------------------------------------------------------------------------  Recent Labs  02/04/16 1021  HGBA1C 6.2*   ------------------------------------------------------------------------------------------------------------------ No results for input(s): CHOL, HDL, LDLCALC, TRIG, CHOLHDL, LDLDIRECT in the last 72 hours. ------------------------------------------------------------------------------------------------------------------ No results for input(s): TSH, T4TOTAL, T3FREE, THYROIDAB in the last 72 hours.  Invalid input(s): FREET3 ------------------------------------------------------------------------------------------------------------------ No results for input(s): VITAMINB12, FOLATE, FERRITIN, TIBC, IRON, RETICCTPCT in the last 72 hours.  Coagulation profile No results for input(s): INR, PROTIME in the last 168 hours.  No results for input(s): DDIMER in the last 72 hours.  Cardiac Enzymes No results for input(s): CKMB, TROPONINI, MYOGLOBIN in the last 168 hours.  Invalid input(s): CK ------------------------------------------------------------------------------------------------------------------ Invalid input(s): POCBNP   CBG:  Recent Labs Lab 02/05/16 0619 02/05/16 1139 02/05/16 1625 02/05/16 2203 02/06/16 0602  GLUCAP 207* 211* 136* 168* 235*       Studies: Ct Abdomen Pelvis Wo Contrast  Result Date: 02/04/2016 CLINICAL DATA:  Abdominal distention, constipation, dementia, diabetes mellitus, GERD, former smoker, COPD EXAM: CT ABDOMEN AND PELVIS WITHOUT CONTRAST TECHNIQUE: Multidetector CT imaging of the abdomen and pelvis was performed following the standard protocol without IV contrast. Sagittal and coronal MPR images reconstructed from axial data set. COMPARISON:  11/18/2015 FINDINGS: Lower chest: Lung bases clear Hepatobiliary: Liver and gallbladder normal appearance Pancreas: Partial fatty replacement.   Otherwise normal appearance Spleen: Normal appearance Adrenals/Urinary Tract: LEFT adrenal nodule 17 x 14 mm image 30 previously 16 x 15 mm. RIGHT adrenal nodule 14 x 10 mm unchanged. Kidneys, ureters, and bladder normal appearance. Stomach/Bowel: Normal appendix. Gaseous distention of the ascending colon with gas throughout remainder of colon to rectum as well. Stomach and bowel loops otherwise normal appearance. No definite bowel wall thickening or evidence of obstruction. Vascular/Lymphatic: Mild scattered atherosclerotic calcifications without aortic aneurysm. No adenopathy. Normal sized periportal and peripancreatic nodes. Reproductive: Unremarkable uterus and adnexa Other: No free air or free fluid.  No hernia. Musculoskeletal: Degenerative disc disease changes thoracolumbar spine. No acute bone lesions. IMPRESSION: Stable adrenal nodules. Aortic atherosclerosis. Slight gaseous distention of the ascending colon without evidence of bowel obstruction or wall thickening. Remainder of exam unremarkable. Electronically Signed   By: Ulyses Southward M.D.   On: 02/04/2016 17:12      Lab Results  Component Value Date   HGBA1C 6.2 (H) 02/04/2016   Lab Results  Component Value Date   CREATININE 0.68 02/05/2016       Scheduled Meds: . bisacodyl  10 mg Rectal Once  . memantine  28 mg Oral QHS   And  . donepezil  10 mg Oral QHS  . enoxaparin (LOVENOX) injection  55 mg Subcutaneous Q24H  . fluticasone furoate-vilanterol  1 puff Inhalation Daily  . gabapentin  300 mg Oral TID  . insulin aspart  0-9 Units Subcutaneous TID WC  . insulin detemir  25 Units Subcutaneous Daily  . loxapine  20 mg Oral QHS  . metoCLOPramide  10 mg Oral TID AC & HS  . montelukast  10 mg Oral QHS  . niacin  50 mg Oral QHS  . piperacillin-tazobactam (ZOSYN)  IV  3.375 g Intravenous Q8H  . potassium chloride  10 mEq Oral Daily  . potassium chloride  40 mEq Oral Once  . primidone  50 mg Oral q morning - 10a  .  sertraline  150  mg Oral QHS  . sulfamethoxazole-trimethoprim  1 tablet Oral Q12H  . topiramate  100 mg Oral Daily  . topiramate  200 mg Oral QHS   Continuous Infusions:    LOS: 3 days    Time spent: >30 MINS    [facility] Surgery CenterBROL,Merit Maybee  [facility] Pager [phone]. If 7PM-7AM, please contact night-coverage at [email or web address], password [email or web address] 02/06/2016, 10:44 AM  LOS: 3 days

## 2016-02-07 DIAGNOSIS — D649 Anemia, unspecified: Secondary | ICD-10-CM

## 2016-02-07 LAB — URINE MICROSCOPIC-ADD ON
BACTERIA UA: NONE SEEN
RBC / HPF: NONE SEEN RBC/hpf (ref 0–5)

## 2016-02-07 LAB — GLUCOSE, CAPILLARY
GLUCOSE-CAPILLARY: 209 mg/dL — AB (ref 65–99)
GLUCOSE-CAPILLARY: 186 mg/dL — AB (ref 65–99)
GLUCOSE-CAPILLARY: 209 mg/dL — AB (ref 65–99)
Glucose-Capillary: 153 mg/dL — ABNORMAL HIGH (ref 65–99)

## 2016-02-07 LAB — BASIC METABOLIC PANEL
ANION GAP: 9 (ref 5–15)
BUN: 9 mg/dL (ref 6–20)
CALCIUM: 8.7 mg/dL — AB (ref 8.9–10.3)
CO2: 24 mmol/L (ref 22–32)
Chloride: 105 mmol/L (ref 101–111)
Creatinine, Ser: 1.74 mg/dL — ABNORMAL HIGH (ref 0.44–1.00)
GFR, EST AFRICAN AMERICAN: 35 mL/min — AB (ref >60)
GFR, EST NON AFRICAN AMERICAN: 30 mL/min — AB (ref >60)
Glucose, Bld: 204 mg/dL — ABNORMAL HIGH (ref 65–99)
POTASSIUM: 3.3 mmol/L — AB (ref 3.5–5.1)
SODIUM: 138 mmol/L (ref 135–145)

## 2016-02-07 LAB — URINALYSIS, ROUTINE W REFLEX MICROSCOPIC
BILIRUBIN URINE: NEGATIVE
Glucose, UA: NEGATIVE mg/dL
Ketones, ur: NEGATIVE mg/dL
Nitrite: NEGATIVE
PH: 5.5 (ref 5.0–8.0)
Protein, ur: NEGATIVE mg/dL

## 2016-02-07 LAB — NA AND K (SODIUM & POTASSIUM), RAND UR: POTASSIUM UR: 11 mmol/L

## 2016-02-07 LAB — CREATININE, URINE, RANDOM: Creatinine, Urine: 50.27 mg/dL

## 2016-02-07 MED ORDER — DOXYCYCLINE HYCLATE 100 MG PO TABS
100.0000 mg | ORAL_TABLET | Freq: Two times a day (BID) | ORAL | Status: DC
  Administered 2016-02-07 – 2016-02-11 (×9): 100 mg via ORAL
  Filled 2016-02-07 (×9): qty 1

## 2016-02-07 MED ORDER — CEPHALEXIN 500 MG PO CAPS
500.0000 mg | ORAL_CAPSULE | Freq: Two times a day (BID) | ORAL | Status: DC
  Administered 2016-02-07 – 2016-02-10 (×8): 500 mg via ORAL
  Filled 2016-02-07 (×5): qty 1
  Filled 2016-02-07: qty 2
  Filled 2016-02-07 (×2): qty 1
  Filled 2016-02-07: qty 2

## 2016-02-07 MED ORDER — POTASSIUM CHLORIDE CRYS ER 20 MEQ PO TBCR
40.0000 meq | EXTENDED_RELEASE_TABLET | Freq: Once | ORAL | Status: AC
  Administered 2016-02-07: 40 meq via ORAL
  Filled 2016-02-07: qty 2

## 2016-02-07 MED ORDER — MAGNESIUM SULFATE 2 GM/50ML IV SOLN
2.0000 g | Freq: Once | INTRAVENOUS | Status: AC
  Administered 2016-02-07: 2 g via INTRAVENOUS
  Filled 2016-02-07: qty 50

## 2016-02-07 NOTE — Evaluation (Signed)
Physical Therapy Evaluation Patient Details Name: Stephanie Rice MRN: [id number] DOB: Jun 04, 1955 Today's Date: 02/07/2016   History of Present Illness  Pt adm with facial cellulitis. PMH - dementia, bipolar, DM, COPD  Clinical Impression  Pt doing well with mobility and no further PT needed.       Follow Up Recommendations No PT follow up    Equipment Recommendations  None recommended by PT    Recommendations for Other Services       Precautions / Restrictions Precautions Precautions: Fall Restrictions Weight Bearing Restrictions: No      Mobility  Bed Mobility               General bed mobility comments: Pt up in chair  Transfers Overall transfer level: Needs assistance Equipment used: None Transfers: Sit to/from Stand Sit to Stand: Supervision            Ambulation/Gait Ambulation/Gait assistance: Supervision;Min guard Ambulation Distance (Feet): 200 Feet Assistive device: None Gait Pattern/deviations: Step-through pattern;Decreased step length - right;Decreased step length - left Gait velocity: decr Gait velocity interpretation: Below normal speed for age/gender General Gait Details: No loss of balance. Pt reports slightly light headed at one point so pt stopped and stood briefly until that passed  Information systems manager Rankin (Stroke Patients Only)       Balance Overall balance assessment: Needs assistance Sitting-balance support: No upper extremity supported;Feet supported Sitting balance-Leahy Scale: Normal     Standing balance support: No upper extremity supported;During functional activity Standing balance-Leahy Scale: Good                               Pertinent Vitals/Pain Pain Assessment: No/denies pain    Home Living Family/patient expects to be discharged to:: Private residence Living Arrangements: Children Available Help at Discharge: Family Type of Home: House Home  Access: Stairs to enter Entrance Stairs-Rails: None Secretary/administrator of Steps: 3-4 Home Layout: One level Home Equipment: None      Prior Function Level of Independence: Needs assistance   Gait / Transfers Assistance Needed: Independent without assistive device           Hand Dominance        Extremity/Trunk Assessment   Upper Extremity Assessment: Defer to OT evaluation           Lower Extremity Assessment: Overall WFL for tasks assessed         Communication   Communication: No difficulties  Cognition Arousal/Alertness: Awake/alert Behavior During Therapy: WFL for tasks assessed/performed Overall Cognitive Status: History of cognitive impairments - at baseline                      General Comments      Exercises     Assessment/Plan    PT Assessment Patent does not need any further PT services  PT Problem List            PT Treatment Interventions      PT Goals (Current goals can be found in the Care Plan section)  Acute Rehab PT Goals PT Goal Formulation: All assessment and education complete, DC therapy    Frequency     Barriers to discharge        Co-evaluation               End of Session Equipment Utilized  During Treatment: Gait belt Activity Tolerance: Patient tolerated treatment well Patient left: in chair;with call bell/phone within reach;with chair alarm set Nurse Communication: Mobility status         Time: [id number] PT Time Calculation (min) (ACUTE ONLY): 10 min   Charges:   PT Evaluation $PT Eval Low Complexity: 1 Procedure     PT G Codes:        Naijah Lacek 02/07/2016, 9:46 AM [facility] CenterCary Bird Swetz PT [phone]

## 2016-02-07 NOTE — Evaluation (Signed)
Occupational Therapy Evaluation and Discharge Patient Details Name: Stephanie RobertDeborah Woody MRN: [id number] DOB: [birth year] Today's Date: 02/07/2016    History of Present Illness Pt adm with facial cellulitis. PMH - dementia, bipolar, DM, COPD   Clinical Impression   Pt reports she required assist for ADL from [facility] aide and her daughter PTA. Currently pt overall supervision for functional mobility and max assist for LB ADL and peri care. Pt reports she currently feels at her baseline with no residual weakness or visual changes. Pt planning to d/c home with family support. No OT follow up needed but do feel continuation of HH aide would be beneficial for pt along with 24/7 supervision for safety due to cognitive impairments. No further acute OT needs identified; signing off at this time. Please re-consult if needs change. Thank you for this referral.    Follow Up Recommendations  No OT follow up;Supervision/Assistance - 24 hour;Other (comment) (HH aide)    Equipment Recommendations  None recommended by OT    Recommendations for Other Services       Precautions / Restrictions Precautions Precautions: Fall Restrictions Weight Bearing Restrictions: No      Mobility Bed Mobility               General bed mobility comments: Pt OOB in chair upon arrival.  Transfers Overall transfer level: Needs assistance Equipment used: None Transfers: Sit to/from Stand Sit to Stand: Supervision         General transfer comment: Supervision for safety; no physical assist required.    Balance Overall balance assessment: Needs assistance Sitting-balance support: Feet supported;No upper extremity supported Sitting balance-Leahy Scale: Normal     Standing balance support: No upper extremity supported;During functional activity Standing balance-Leahy Scale: Good                              ADL Overall ADL's : Needs assistance/impaired Eating/Feeding: Set up;Sitting    Grooming: Supervision/safety;Wash/dry hands;Standing   Upper Body Bathing: Set up;Supervision/ safety;Sitting   Lower Body Bathing: Maximal assistance;Sit to/from stand   Upper Body Dressing : Set up;Supervision/safety;Sitting   Lower Body Dressing: Moderate assistance;Sit to/from stand Lower Body Dressing Details (indicate cue type and reason): Pt able to doff brief but difficulty getting brief started over feet. Assist also provided for donning socks Toilet Transfer: Supervision/safety;Ambulation;Comfort height toilet   Toileting- Clothing Manipulation and Hygiene: Maximal assistance;Sit to/from stand Toileting - Clothing Manipulation Details (indicate cue type and reason): total assist for peri care. Pt able to pull up brief in standing     Functional mobility during ADLs: Supervision/safety General ADL Comments: Pt reports she feels at baseline with ADL and functional mobility. No visual changes.     Vision Vision Assessment?: No apparent visual deficits   Perception     Praxis      Pertinent Vitals/Pain Pain Assessment: No/denies pain     Hand Dominance     Extremity/Trunk Assessment Upper Extremity Assessment Upper Extremity Assessment: Overall WFL for tasks assessed   Lower Extremity Assessment Lower Extremity Assessment: Defer to PT evaluation       Communication Communication Communication: No difficulties   Cognition Arousal/Alertness: Awake/alert Behavior During Therapy: WFL for tasks assessed/performed Overall Cognitive Status: History of cognitive impairments - at baseline                     General Comments       Exercises  Shoulder Instructions      Home Living Family/patient expects to be discharged to:: Private residence Living Arrangements: Children Available Help at Discharge: Family;Personal care attendant Type of Home: House Home Access: Stairs to enter [employer] of Steps: 3-4 Entrance Stairs-Rails:  None Home Layout: One level     Bathroom Shower/Tub: Chief Strategy Officer: Standard     Home Equipment: Shower seat          Prior Functioning/Environment Level of Independence: Needs assistance  Gait / Transfers Assistance Needed: Independent without assistive device ADL's / Homemaking Assistance Needed: Pt reports she has an aide that assists with bathing and dressing. Also reports that pts daughter performs peri care for pt after BM.            OT Problem List: Impaired balance (sitting and/or standing);Decreased cognition;Decreased safety awareness;Obesity   OT Treatment/Interventions:      OT Goals(Current goals can be found in the care plan section) Acute Rehab OT Goals Patient Stated Goal: return home OT Goal Formulation: All assessment and education complete, DC therapy  OT Frequency:     Barriers to D/C:            Co-evaluation              End of Session Nurse Communication: Mobility status  Activity Tolerance: Patient tolerated treatment well Patient left: in chair;with call bell/phone within reach;with chair alarm set   Time: 4696-2952 OT Time Calculation (min): 20 min Charges:  OT General Charges $OT Visit: 1 Procedure OT Evaluation $OT Eval Moderate Complexity: 1 Procedure G-Codes:     Gaye Alken M.S., OTR/L Pager: [phone]  02/07/2016, 3:07 PM

## 2016-02-07 NOTE — Progress Notes (Signed)
[facility] PROGRESS NOTE  Stephanie Rice ZOX:[id number] DOB: 25-Jun-1955 DOA: 02/03/2016   PCP: No primary care provider on file.     Assessment/Plan: Principal Problem:   Facial cellulitis Active Problems:   Cellulitis   Diabetes mellitus type 2, controlled (HCC)   Normocytic normochromic anemia   Dementia   Bipolar I disorder (HCC)   Abdominal distention      60 y.o.femalewith diabetes mellitus type 2, dementia, COPD was brought to the ER because of worsening right facial erythema swelling and pain. Patient has been taking oral antibiotics for last 2 days despite which patient's swelling did not improve and was worsening. On exam patient has right facial erythema involving the right maxillary and right periorbital area. CT scan does not show any involvement of the bone or orbits. Patient has been admitted for IV antibiotics. On exam patient is able to move eyes without difficulty.   Assessment and plan 1. Right facial cellulitis- -Completed  IV antibiotics including vancomycin and Zosyn -No growth from blood culture on 9/20. -was given bactrim/cipro with increase in renal function- -change abx to doxy/keflex after speaking with pharmacy 2. Diabetes mellitus type 2- uncontrolled, patient is placed on sliding scale coverage. Hold metformin while inpatient.   hemoglobin A1c 6.2. Patient has been started on Levemir. Diabetes coordinator consult for Levemir/insulin teaching. Daughter states she takes levemir at home,  3. History of dementia on Namenda and Aricept. 4. History of migraineon Topamax. 5. Chronic anemia - follow CBC. 6. Abdominal distension: belly distended with fluid shift,   CT abdomen and pelvis Stable adrenal nodules, no mass, or obstruction. Patient had a bowel movement yesterday.Has a hx of gastroparesis?motility disorder, will need to see GI as outpatient 7. OSA-noncompliant with CPAP, has 2L aof o2 at baseline    8. AKI    -suspect from  bactrim    -check FENA    -recheck labs in AM    -I/os   DVT prophylaxsis lovenox   Code Status:  Full code     Family Communication: called daughter, no answer Disposition Plan:  Await kidney improvement        Subjective Facial redness is much better   Objective: Vitals:   02/07/16 0116 02/07/16 0510 02/07/16 0908 02/07/16 0934  BP: (!) 143/68 (!) 144/71 (!) 145/60   Pulse: 82 87 86   Resp: (!) 22 (!) 22 20   Temp: 98.6 F (37 C) 98.3 F (36.8 C) 98.4 F (36.9 C)   TempSrc: Oral Oral Oral   SpO2: 97% 93% 96% 98%  Weight:      Height:        Intake/Output Summary (Last 24 hours) at 02/07/16 1208 Last data filed at 02/07/16 0830  Gross per 24 hour  Intake              840 ml  Output                0 ml  Net              840 ml    Exam:  Examination:  General exam: Appears calm and comfortable  Respiratory system: Clear to auscultation. Respiratory effort normal. Cardiovascular system: S1 & S2 heard, RRR. No JVD, murmurs, rubs, gallops or clicks. No pedal edema. Gastrointestinal system: Abdomen is nondistended, soft and nontender. No organomegaly or masses felt. Normal bowel sounds heard. Central nervous system: Alert  Skin: mild redness on right side of face  Data Reviewed: I have personally reviewed following labs and imaging studies  Micro Results Recent Results (from the past 240 hour(s))  Culture, blood (routine x 2)     Status: None (Preliminary result)   Collection Time: 02/03/16  3:10 PM  Result Value Ref Range Status   Specimen Description   Final    BLOOD RIGHT ANTECUBITAL Performed at [facility]    Special Requests BOTTLES DRAWN AEROBIC AND ANAEROBIC 10 CC EACH  Final   Culture NO GROWTH 3 DAYS  Final   Report Status PENDING  Incomplete  Culture, blood (routine x 2)     Status: None (Preliminary result)   Collection Time: 02/03/16  4:30 PM  Result Value Ref Range Status   Specimen Description BLOOD LEFT HAND  Final    Special Requests   Final    BOTTLES DRAWN AEROBIC ONLY 4CC Performed at [facility]    Culture NO GROWTH 3 DAYS  Final   Report Status PENDING  Incomplete    Radiology Reports Ct Abdomen Pelvis Wo Contrast  Result Date: 02/04/2016 CLINICAL DATA:  Abdominal distention, constipation, dementia, diabetes mellitus, GERD, former smoker, COPD EXAM: CT ABDOMEN AND PELVIS WITHOUT CONTRAST TECHNIQUE: Multidetector CT imaging of the abdomen and pelvis was performed following the standard protocol without IV contrast. Sagittal and coronal MPR images reconstructed from axial data set. COMPARISON:  11/18/2015 FINDINGS: Lower chest: Lung bases clear Hepatobiliary: Liver and gallbladder normal appearance Pancreas: Partial fatty replacement.  Otherwise normal appearance Spleen: Normal appearance Adrenals/Urinary Tract: LEFT adrenal nodule 17 x 14 mm image 30 previously 16 x 15 mm. RIGHT adrenal nodule 14 x 10 mm unchanged. Kidneys, ureters, and bladder normal appearance. Stomach/Bowel: Normal appendix. Gaseous distention of the ascending colon with gas throughout remainder of colon to rectum as well. Stomach and bowel loops otherwise normal appearance. No definite bowel wall thickening or evidence of obstruction. Vascular/Lymphatic: Mild scattered atherosclerotic calcifications without aortic aneurysm. No adenopathy. Normal sized periportal and peripancreatic nodes. Reproductive: Unremarkable uterus and adnexa Other: No free air or free fluid.  No hernia. Musculoskeletal: Degenerative disc disease changes thoracolumbar spine. No acute bone lesions. IMPRESSION: Stable adrenal nodules. Aortic atherosclerosis. Slight gaseous distention of the ascending colon without evidence of bowel obstruction or wall thickening. Remainder of exam unremarkable. Electronically Signed   By: Ulyses Southward M.D.   On: 02/04/2016 17:12   Ct Head Wo Contrast  Result Date: 02/03/2016 CLINICAL DATA:  60 year old female with right facial  swelling and redness for 3 days. Possible fall. Initial encounter. EXAM: CT HEAD WITHOUT CONTRAST CT MAXILLOFACIAL WITHOUT CONTRAST TECHNIQUE: Multidetector CT imaging of the head and maxillofacial structures were performed using the standard protocol without intravenous contrast. Multiplanar CT image reconstructions of the maxillofacial structures were also generated. COMPARISON:  [facility] noncontrast head CT 09/05/2005 FINDINGS: CT HEAD FINDINGS Brain: Mild generalized cerebral volume loss, but volume remains within normal limits for age. No midline shift, ventriculomegaly, mass effect, evidence of mass lesion, intracranial hemorrhage or evidence of cortically based acute infarction. Gray-white matter differentiation is within normal limits throughout the brain. Minimal to mild for age nonspecific white matter hypodensity. Vascular: No suspicious intracranial vascular hyperdensity. Skull: No acute osseous abnormality identified. Sinuses/Orbits: Visualized paranasal sinuses and mastoids are stable and well pneumatized. Other: No acute orbit or scalp soft tissue findings. CT MAXILLOFACIAL FINDINGS Osseous: Absent dentition. Mandible intact. Maxilla intact. No facial fracture identified. Advanced degenerative changes in the visible cervical spine including bulky anterior endplate osteophytosis.  No acute osseous abnormality identified. Orbits: Negative. Sinuses: Clear. Soft tissues: Negative visualized noncontrast larynx, pharynx, parapharyngeal spaces, retropharyngeal space, sublingual space, submandibular glands and parotid glands. No fluid collection identified in the face or upper neck. Visible cervical and submandibular lymph nodes are normal. Limited intracranial: Reported above. IMPRESSION: 1. No acute intracranial abnormality and largely unremarkable for age noncontrast CT appearance of the brain. 2. No acute osseous abnormality or inflammatory process identified in the face. Absent  dentition. Clear paranasal sinuses. 3. Cervical spine diffuse idiopathic skeletal hyperostosis. Electronically Signed   By: Odessa FlemingH  Hall M.D.   On: 02/03/2016 16:13   Ct Maxillofacial Wo Contrast  Result Date: 02/03/2016 CLINICAL DATA:  60 year old female with right facial swelling and redness for 3 days. Possible fall. Initial encounter. EXAM: CT HEAD WITHOUT CONTRAST CT MAXILLOFACIAL WITHOUT CONTRAST TECHNIQUE: Multidetector CT imaging of the head and maxillofacial structures were performed using the standard protocol without intravenous contrast. Multiplanar CT image reconstructions of the maxillofacial structures were also generated. COMPARISON:  [facility]contrast head CT 09/05/2005 FINDINGS: CT HEAD FINDINGS Brain: Mild generalized cerebral volume loss, but volume remains within normal limits for age. No midline shift, ventriculomegaly, mass effect, evidence of mass lesion, intracranial hemorrhage or evidence of cortically based acute infarction. Gray-white matter differentiation is within normal limits throughout the brain. Minimal to mild for age nonspecific white matter hypodensity. Vascular: No suspicious intracranial vascular hyperdensity. Skull: No acute osseous abnormality identified. Sinuses/Orbits: Visualized paranasal sinuses and mastoids are stable and well pneumatized. Other: No acute orbit or scalp soft tissue findings. CT MAXILLOFACIAL FINDINGS Osseous: Absent dentition. Mandible intact. Maxilla intact. No facial fracture identified. Advanced degenerative changes in the visible cervical spine including bulky anterior endplate osteophytosis. No acute osseous abnormality identified. Orbits: Negative. Sinuses: Clear. Soft tissues: Negative visualized noncontrast larynx, pharynx, parapharyngeal spaces, retropharyngeal space, sublingual space, submandibular glands and parotid glands. No fluid collection identified in the face or upper neck. Visible cervical and submandibular lymph  nodes are normal. Limited intracranial: Reported above. IMPRESSION: 1. No acute intracranial abnormality and largely unremarkable for age noncontrast CT appearance of the brain. 2. No acute osseous abnormality or inflammatory process identified in the face. Absent dentition. Clear paranasal sinuses. 3. Cervical spine diffuse idiopathic skeletal hyperostosis. Electronically Signed   By: Odessa FlemingH  Hall M.D.   On: 02/03/2016 16:13     CBC  Recent Labs Lab 02/03/16 1510 02/04/16 0541 02/05/16 0346  WBC 6.4 7.1 7.3  HGB 10.8* 11.4* 10.8*  HCT 33.3* 36.3 35.1*  PLT 182 208 237  MCV 89.5 89.2 91.2  MCH 29.0 28.0 28.1  MCHC 32.4 31.4 30.8  RDW 14.0 13.9 14.3  LYMPHSABS 1.0 1.2  --   MONOABS 0.7 0.8  --   EOSABS 0.2 0.2  --   BASOSABS 0.0 0.0  --     Chemistries   Recent Labs Lab 02/03/16 1510 02/04/16 0541 02/05/16 0346 02/06/16 1048 02/07/16 0346  NA 130* 142 140  --  138  K 3.8 3.8 3.3*  --  3.3*  CL 96* 106 105  --  105  CO2 25 24 22   --  24  GLUCOSE 234* 169* 191*  --  204*  BUN 5* <5* <5*  --  9  CREATININE 0.42* 0.51 0.68  --  1.74*  CALCIUM 8.5* 9.2 8.8*  --  8.7*  MG  --   --   --  1.4*  --   AST 18 18 23   --   --  ALT 23 25 29   --   --   ALKPHOS 78 86 78  --   --   BILITOT 0.3 0.5 0.5  --   --    ------------------------------------------------------------------------------------------------------------------ estimated creatinine clearance is 38.9 mL/min (by C-G formula based on SCr of 1.74 mg/dL (H)). ------------------------------------------------------------------------------------------------------------------ No results for input(s): HGBA1C in the last 72 hours. ------------------------------------------------------------------------------------------------------------------ No results for input(s): CHOL, HDL, LDLCALC, TRIG, CHOLHDL, LDLDIRECT in the last 72  hours. ------------------------------------------------------------------------------------------------------------------ No results for input(s): TSH, T4TOTAL, T3FREE, THYROIDAB in the last 72 hours.  Invalid input(s): FREET3 ------------------------------------------------------------------------------------------------------------------ No results for input(s): VITAMINB12, FOLATE, FERRITIN, TIBC, IRON, RETICCTPCT in the last 72 hours.  Coagulation profile No results for input(s): INR, PROTIME in the last 168 hours.  No results for input(s): DDIMER in the last 72 hours.  Cardiac Enzymes No results for input(s): CKMB, TROPONINI, MYOGLOBIN in the last 168 hours.  Invalid input(s): CK ------------------------------------------------------------------------------------------------------------------ Invalid input(s): POCBNP   CBG:  Recent Labs Lab 02/06/16 1124 02/06/16 1603 02/06/16 2235 02/07/16 0618 02/07/16 1201  GLUCAP 211* 144* 213* 209* 186*       Studies: No results found.    Lab Results  Component Value Date   HGBA1C 6.2 (H) 02/04/2016   Lab Results  Component Value Date   CREATININE 1.74 (H) 02/07/2016       Scheduled Meds: . bisacodyl  10 mg Rectal Once  . cephALEXin  500 mg Oral Q12H  . memantine  28 mg Oral QHS   And  . donepezil  10 mg Oral QHS  . doxycycline  100 mg Oral Q12H  . enoxaparin (LOVENOX) injection  55 mg Subcutaneous Q24H  . fluticasone furoate-vilanterol  1 puff Inhalation Daily  . gabapentin  300 mg Oral TID  . insulin aspart  0-9 Units Subcutaneous TID WC  . insulin detemir  25 Units Subcutaneous Daily  . loxapine  20 mg Oral QHS  . metoCLOPramide  10 mg Oral TID AC & HS  . montelukast  10 mg Oral QHS  . niacin  50 mg Oral QHS  . potassium chloride  10 mEq Oral Daily  . primidone  50 mg Oral q morning - 10a  . sertraline  150 mg Oral QHS  . topiramate  100 mg Oral Daily  . topiramate  200 mg Oral QHS   Continuous  Infusions:    LOS: 4 days    Time spent: 25 MINS    Xzaviar Maloof [facility]  [facility] Pager [phone]. If 7PM-7AM, please contact night-coverage at [email or web address], password [facility] 02/07/2016, 12:08 PM  LOS: 4 days

## 2016-02-08 ENCOUNTER — Inpatient Hospital Stay (HOSPITAL_COMMUNITY): Payer: Medicare Other

## 2016-02-08 LAB — BASIC METABOLIC PANEL
ANION GAP: 10 (ref 5–15)
Anion gap: 10 (ref 5–15)
Anion gap: 9 (ref 5–15)
BUN: 13 mg/dL (ref 6–20)
BUN: 15 mg/dL (ref 6–20)
BUN: 17 mg/dL (ref 6–20)
CALCIUM: 8.6 mg/dL — AB (ref 8.9–10.3)
CHLORIDE: 94 mmol/L — AB (ref 101–111)
CO2: 20 mmol/L — ABNORMAL LOW (ref 22–32)
CO2: 19 mmol/L — ABNORMAL LOW (ref 22–32)
CO2: 22 mmol/L (ref 22–32)
CREATININE: 2.08 mg/dL — AB (ref 0.44–1.00)
CREATININE: 2.18 mg/dL — AB (ref 0.44–1.00)
Calcium: 8.3 mg/dL — ABNORMAL LOW (ref 8.9–10.3)
Calcium: 8.4 mg/dL — ABNORMAL LOW (ref 8.9–10.3)
Chloride: 98 mmol/L — ABNORMAL LOW (ref 101–111)
Chloride: 94 mmol/L — ABNORMAL LOW (ref 101–111)
Creatinine, Ser: 2.21 mg/dL — ABNORMAL HIGH (ref 0.44–1.00)
GFR calc Af Amer: 28 mL/min — ABNORMAL LOW (ref >60)
GFR calc non Af Amer: 23 mL/min — ABNORMAL LOW (ref >60)
GFR, EST AFRICAN AMERICAN: 26 mL/min — AB (ref >60)
GFR, EST AFRICAN AMERICAN: 27 mL/min — AB (ref >60)
GFR, EST NON AFRICAN AMERICAN: 23 mL/min — AB (ref >60)
GFR, EST NON AFRICAN AMERICAN: 24 mL/min — AB (ref >60)
GLUCOSE: 179 mg/dL — AB (ref 65–99)
GLUCOSE: 236 mg/dL — AB (ref 65–99)
Glucose, Bld: 187 mg/dL — ABNORMAL HIGH (ref 65–99)
POTASSIUM: 3.6 mmol/L (ref 3.5–5.1)
POTASSIUM: 3.7 mmol/L (ref 3.5–5.1)
Potassium: 3.8 mmol/L (ref 3.5–5.1)
SODIUM: 128 mmol/L — AB (ref 135–145)
SODIUM: 123 mmol/L — AB (ref 135–145)
Sodium: 125 mmol/L — ABNORMAL LOW (ref 135–145)

## 2016-02-08 LAB — GLUCOSE, CAPILLARY
GLUCOSE-CAPILLARY: 166 mg/dL — AB (ref 65–99)
GLUCOSE-CAPILLARY: 194 mg/dL — AB (ref 65–99)
GLUCOSE-CAPILLARY: 184 mg/dL — AB (ref 65–99)
Glucose-Capillary: 192 mg/dL — ABNORMAL HIGH (ref 65–99)

## 2016-02-08 LAB — CULTURE, BLOOD (ROUTINE X 2)
CULTURE: NO GROWTH
Culture: NO GROWTH

## 2016-02-08 LAB — OSMOLALITY, URINE: OSMOLALITY UR: 88 mosm/kg — AB (ref 300–900)

## 2016-02-08 LAB — OSMOLALITY: OSMOLALITY: 271 mosm/kg — AB (ref 275–295)

## 2016-02-08 MED ORDER — INSULIN DETEMIR 100 UNIT/ML ~~LOC~~ SOLN
20.0000 [IU] | Freq: Every day | SUBCUTANEOUS | Status: DC
  Administered 2016-02-08 – 2016-02-11 (×4): 20 [IU] via SUBCUTANEOUS
  Filled 2016-02-08 (×5): qty 0.2

## 2016-02-08 MED ORDER — CETAPHIL MOISTURIZING EX LOTN
TOPICAL_LOTION | Freq: Every day | CUTANEOUS | Status: DC
Start: 2016-02-08 — End: 2016-02-11
  Administered 2016-02-08 – 2016-02-11 (×3): via TOPICAL
  Filled 2016-02-08: qty 473

## 2016-02-08 MED ORDER — FUROSEMIDE 40 MG PO TABS
40.0000 mg | ORAL_TABLET | Freq: Once | ORAL | Status: AC
  Administered 2016-02-08: 40 mg via ORAL
  Filled 2016-02-08: qty 1

## 2016-02-08 MED ORDER — FUROSEMIDE 40 MG PO TABS: 40.0000 mg | ORAL_TABLET | Freq: Once | ORAL | Status: DC

## 2016-02-08 NOTE — Progress Notes (Signed)
Patient's Cr has improved with lasix and patient has diuresed > 1L.  Na did drop.  Will fluid restrict as nursing reports large amount of PO water intake.  Recheck Cr tonight to ensure no further drop along with urine and serum osmos.  Marlin CanaryJessica Chrisie Jankovich

## 2016-02-08 NOTE — Plan of Care (Signed)
Problem: Fluid Volume: Goal: Ability to maintain a balanced intake and output will improve Outcome: Progressing Strict I and O. Pt drinks a lot of H2O. Urine clear yellow, see I ande O for amounts.

## 2016-02-08 NOTE — Care Management Note (Signed)
Case Management Note  Patient Details  Name: Stephanie Rice MRN: [id number] Date of Birth: May 12, 1956  Subjective/Objective:                    Action/Plan: No f/u per PT/OT. Plan is to discharge home when medically ready. CM following for d/c needs.   Expected Discharge Date:                  Expected Discharge Plan:  Home/Self Care  In-House Referral:     Discharge planning Services     Post Acute Care Choice:    Choice offered to:     DME Arranged:    DME Agency:     HH Arranged:    HH Agency:     Status of Service:  In process, will continue to follow  If discussed at Long Length of Stay Meetings, dates discussed:    Additional Comments:  Kermit BaloKelli F [name], RN 02/08/2016, 11:24 AM

## 2016-02-08 NOTE — Care Management Important Message (Signed)
Important Message  Patient Details  Name: Rowe RobertDeborah Fusselman MRN: [id number] Date of Birth: 1955-06-12   Medicare Important Message Given:  Yes    Sherrine Salberg Stefan ChurchBratton 02/08/2016, 11:47 AM

## 2016-02-08 NOTE — Progress Notes (Signed)
[facility] PROGRESS NOTE  Stephanie Rice ZOX:[id number] DOB: Oct 20, 1955 DOA: 02/03/2016   PCP: No primary care provider on file.     Assessment/Plan: Principal Problem:   Facial cellulitis Active Problems:   Cellulitis   Diabetes mellitus type 2, controlled (HCC)   Normocytic normochromic anemia   Dementia   Bipolar I disorder (HCC)   Abdominal distention      60 y.o.femalewith diabetes mellitus type 2, dementia, COPD was brought to the ER because of worsening right facial erythema swelling and pain. Patient has been taking oral antibiotics for last 2 days despite which patient's swelling did not improve and was worsening. On exam patient has right facial erythema involving the right maxillary and right periorbital area. CT scan does not show any involvement of the bone or orbits. Patient has been admitted for IV antibiotics. On exam patient is able to move eyes without difficulty.   Assessment and plan 1. Right facial cellulitis- -Completed  IV antibiotics including vancomycin and Zosyn -No growth from blood culture on 9/20. -was given bactrim/cipro with increase in creatine- -change abx to doxy/keflex after speaking with pharmacy 2. Diabetes mellitus type 2- uncontrolled, patient is placed on sliding scale coverage. Hold metformin while inpatient.   hemoglobin A1c 6.2. Patient has been started on Levemir. Diabetes coordinator consult for Levemir/insulin teaching. Daughter states she takes levemir at home,  3. History of dementia on Namenda and Aricept. 4. History of migraineon Topamax. 5. Chronic anemia - follow CBC. 6. Abdominal distension: belly distended with fluid shift,   CT abdomen and pelvis Stable adrenal nodules, no mass, or obstruction. Patient had a bowel movement yesterday.Has a hx of gastroparesis?motility disorder, will need to see GI as outpatient 7. OSA-noncompliant with CPAP, has 2L aof o2 at baseline    8. AKI    -suspect from bactrim      -check FENA -- < 1?  Patient is > 6 L + since admission-- will give low dose lasix and    recheck BMP at 3PM    -recheck labs in AM    -I/os    -renal U/S- pending    -urine eosinophils    -renal consult if not better   DVT prophylaxsis lovenox   Code Status:  Full code     Family Communication: spoke with granddaughter Disposition Plan:  Await kidney improvement        Subjective Upset she is not going home today  Objective: Vitals:   02/07/16 2056 02/08/16 0035 02/08/16 0516 02/08/16 0924  BP: (!) 160/76 (!) 153/71 (!) 144/73 (!) 145/64  Pulse: 91 98 89 76  Resp: (!) 22 18 18 20   Temp: 98 F (36.7 C) 98.1 F (36.7 C) 98.4 F (36.9 C) 98.4 F (36.9 C)  TempSrc: Oral Oral Oral Oral  SpO2: 96% 98% 99% 98%  Weight:      Height:        Intake/Output Summary (Last 24 hours) at 02/08/16 0948 Last data filed at 02/08/16 8119  Gross per 24 hour  Intake              990 ml  Output              100 ml  Net              890 ml    Exam:  Examination:  General exam: irritated Respiratory system: Clear to auscultation. Respiratory effort normal. Cardiovascular system: S1 & S2 heard, RRR. No  JVD, murmurs, rubs, gallops or clicks. No pedal edema. Gastrointestinal system: Abdomen is  Mildly distended, soft and nontender. No organomegaly or masses felt. Normal bowel sounds heard. Central nervous system: Alert  Skin: mild redness on right side of face    Data Reviewed: I have personally reviewed following labs and imaging studies  Micro Results Recent Results (from the past 240 hour(s))  Culture, blood (routine x 2)     Status: None (Preliminary result)   Collection Time: 02/03/16  3:10 PM  Result Value Ref Range Status   Specimen Description BLOOD RIGHT ANTECUBITAL  Final   Special Requests BOTTLES DRAWN AEROBIC AND ANAEROBIC 10 CC EACH  Final   Culture   Final    NO GROWTH 4 DAYS Per[facility] at [facility]    Report Status PENDING  Incomplete   Culture, blood (routine x 2)     Status: None (Preliminary result)   Collection Time: 02/03/16  4:30 PM  Result Value Ref Range Status   Specimen Description BLOOD LEFT HAND  Final   Special Requests BOTTLES DRAWN AEROBIC ONLY 4CC  Final   Culture   Final    NO GROWTH 4 DAYS Performed at [facility]    Report Status PENDING  Incomplete    Radiology Reports Ct Abdomen Pelvis Wo Contrast  Result Date: 02/04/2016 CLINICAL DATA:  Abdominal distention, constipation, dementia, diabetes mellitus, GERD, former smoker, COPD EXAM: CT ABDOMEN AND PELVIS WITHOUT CONTRAST TECHNIQUE: Multidetector CT imaging of the abdomen and pelvis was performed following the standard protocol without IV contrast. Sagittal and coronal MPR images reconstructed from axial data set. COMPARISON:  11/18/2015 FINDINGS: Lower chest: Lung bases clear Hepatobiliary: Liver and gallbladder normal appearance Pancreas: Partial fatty replacement.  Otherwise normal appearance Spleen: Normal appearance Adrenals/Urinary Tract: LEFT adrenal nodule 17 x 14 mm image 30 previously 16 x 15 mm. RIGHT adrenal nodule 14 x 10 mm unchanged. Kidneys, ureters, and bladder normal appearance. Stomach/Bowel: Normal appendix. Gaseous distention of the ascending colon with gas throughout remainder of colon to rectum as well. Stomach and bowel loops otherwise normal appearance. No definite bowel wall thickening or evidence of obstruction. Vascular/Lymphatic: Mild scattered atherosclerotic calcifications without aortic aneurysm. No adenopathy. Normal sized periportal and peripancreatic nodes. Reproductive: Unremarkable uterus and adnexa Other: No free air or free fluid.  No hernia. Musculoskeletal: Degenerative disc disease changes thoracolumbar spine. No acute bone lesions. IMPRESSION: Stable adrenal nodules. Aortic atherosclerosis. Slight gaseous distention of the ascending colon without evidence of bowel obstruction or wall thickening. Remainder of  exam unremarkable. Electronically Signed   By: Ulyses SouthwardMark  Boles M.D.   On: 02/04/2016 17:12   Ct Head Wo Contrast  Result Date: 02/03/2016 CLINICAL DATA:  60 year old female with right facial swelling and redness for 3 days. Possible fall. Initial encounter. EXAM: CT HEAD WITHOUT CONTRAST CT MAXILLOFACIAL WITHOUT CONTRAST TECHNIQUE: Multidetector CT imaging of the head and maxillofacial structures were performed using the standard protocol without intravenous contrast. Multiplanar CT image reconstructions of the maxillofacial structures were also generated. COMPARISON:  [facility]contrast head CT 09/05/2005 FINDINGS: CT HEAD FINDINGS Brain: Mild generalized cerebral volume loss, but volume remains within normal limits for age. No midline shift, ventriculomegaly, mass effect, evidence of mass lesion, intracranial hemorrhage or evidence of cortically based acute infarction. Gray-white matter differentiation is within normal limits throughout the brain. Minimal to mild for age nonspecific white matter hypodensity. Vascular: No suspicious intracranial vascular hyperdensity. Skull: No acute osseous abnormality identified. Sinuses/Orbits: Visualized paranasal  sinuses and mastoids are stable and well pneumatized. Other: No acute orbit or scalp soft tissue findings. CT MAXILLOFACIAL FINDINGS Osseous: Absent dentition. Mandible intact. Maxilla intact. No facial fracture identified. Advanced degenerative changes in the visible cervical spine including bulky anterior endplate osteophytosis. No acute osseous abnormality identified. Orbits: Negative. Sinuses: Clear. Soft tissues: Negative visualized noncontrast larynx, pharynx, parapharyngeal spaces, retropharyngeal space, sublingual space, submandibular glands and parotid glands. No fluid collection identified in the face or upper neck. Visible cervical and submandibular lymph nodes are normal. Limited intracranial: Reported above. IMPRESSION: 1. No acute  intracranial abnormality and largely unremarkable for age noncontrast CT appearance of the brain. 2. No acute osseous abnormality or inflammatory process identified in the face. Absent dentition. Clear paranasal sinuses. 3. Cervical spine diffuse idiopathic skeletal hyperostosis. Electronically Signed   By: Odessa Fleming M.D.   On: 02/03/2016 16:13   Ct Maxillofacial Wo Contrast  Result Date: 02/03/2016 CLINICAL DATA:  60 year old female with right facial swelling and redness for 3 days. Possible fall. Initial encounter. EXAM: CT HEAD WITHOUT CONTRAST CT MAXILLOFACIAL WITHOUT CONTRAST TECHNIQUE: Multidetector CT imaging of the head and maxillofacial structures were performed using the standard protocol without intravenous contrast. Multiplanar CT image reconstructions of the maxillofacial structures were also generated. COMPARISON:  [facility] noncontrast head CT 09/05/2005 FINDINGS: CT HEAD FINDINGS Brain: Mild generalized cerebral volume loss, but volume remains within normal limits for age. No midline shift, ventriculomegaly, mass effect, evidence of mass lesion, intracranial hemorrhage or evidence of cortically based acute infarction. Gray-white matter differentiation is within normal limits throughout the brain. Minimal to mild for age nonspecific white matter hypodensity. Vascular: No suspicious intracranial vascular hyperdensity. Skull: No acute osseous abnormality identified. Sinuses/Orbits: Visualized paranasal sinuses and mastoids are stable and well pneumatized. Other: No acute orbit or scalp soft tissue findings. CT MAXILLOFACIAL FINDINGS Osseous: Absent dentition. Mandible intact. Maxilla intact. No facial fracture identified. Advanced degenerative changes in the visible cervical spine including bulky anterior endplate osteophytosis. No acute osseous abnormality identified. Orbits: Negative. Sinuses: Clear. Soft tissues: Negative visualized noncontrast larynx, pharynx, parapharyngeal  spaces, retropharyngeal space, sublingual space, submandibular glands and parotid glands. No fluid collection identified in the face or upper neck. Visible cervical and submandibular lymph nodes are normal. Limited intracranial: Reported above. IMPRESSION: 1. No acute intracranial abnormality and largely unremarkable for age noncontrast CT appearance of the brain. 2. No acute osseous abnormality or inflammatory process identified in the face. Absent dentition. Clear paranasal sinuses. 3. Cervical spine diffuse idiopathic skeletal hyperostosis. Electronically Signed   By: Odessa Fleming M.D.   On: 02/03/2016 16:13     CBC  Recent Labs Lab 02/03/16 1510 02/04/16 0541 02/05/16 0346  WBC 6.4 7.1 7.3  HGB 10.8* 11.4* 10.8*  HCT 33.3* 36.3 35.1*  PLT 182 208 237  MCV 89.5 89.2 91.2  MCH 29.0 28.0 28.1  MCHC 32.4 31.4 30.8  RDW 14.0 13.9 14.3  LYMPHSABS 1.0 1.2  --   MONOABS 0.7 0.8  --   EOSABS 0.2 0.2  --   BASOSABS 0.0 0.0  --     Chemistries   Recent Labs Lab 02/03/16 1510 02/04/16 0541 02/05/16 0346 02/06/16 1048 02/07/16 0346 02/08/16 0714  NA 130* 142 140  --  138 128*  K 3.8 3.8 3.3*  --  3.3* 3.6  CL 96* 106 105  --  105 98*  CO2 25 24 22   --  24 20*  GLUCOSE 234* 169* 191*  --  204* 179*  BUN 5* <5* <5*  --  9 13  CREATININE 0.42* 0.51 0.68  --  1.74* 2.21*  CALCIUM 8.5* 9.2 8.8*  --  8.7* 8.6*  MG  --   --   --  1.4*  --   --   AST 18 18 23   --   --   --   ALT 23 25 29   --   --   --   ALKPHOS 78 86 78  --   --   --   BILITOT 0.3 0.5 0.5  --   --   --    ------------------------------------------------------------------------------------------------------------------ estimated creatinine clearance is 30.6 mL/min (by C-G formula based on SCr of 2.21 mg/dL (H)). ------------------------------------------------------------------------------------------------------------------ No results for input(s): HGBA1C in the last 72  hours. ------------------------------------------------------------------------------------------------------------------ No results for input(s): CHOL, HDL, LDLCALC, TRIG, CHOLHDL, LDLDIRECT in the last 72 hours. ------------------------------------------------------------------------------------------------------------------ No results for input(s): TSH, T4TOTAL, T3FREE, THYROIDAB in the last 72 hours.  Invalid input(s): FREET3 ------------------------------------------------------------------------------------------------------------------ No results for input(s): VITAMINB12, FOLATE, FERRITIN, TIBC, IRON, RETICCTPCT in the last 72 hours.  Coagulation profile No results for input(s): INR, PROTIME in the last 168 hours.  No results for input(s): DDIMER in the last 72 hours.  Cardiac Enzymes No results for input(s): CKMB, TROPONINI, MYOGLOBIN in the last 168 hours.  Invalid input(s): CK ------------------------------------------------------------------------------------------------------------------ Invalid input(s): POCBNP   CBG:  Recent Labs Lab 02/07/16 0618 02/07/16 1201 02/07/16 1610 02/07/16 2109 02/08/16 0556  GLUCAP 209* 186* 209* 153* 166*       Studies: No results found.    Lab Results  Component Value Date   HGBA1C 6.2 (H) 02/04/2016   Lab Results  Component Value Date   CREATININE 2.21 (H) 02/08/2016       Scheduled Meds: . bisacodyl  10 mg Rectal Once  . cephALEXin  500 mg Oral Q12H  . cetaphil   Topical Daily  . memantine  28 mg Oral QHS   And  . donepezil  10 mg Oral QHS  . doxycycline  100 mg Oral Q12H  . enoxaparin (LOVENOX) injection  55 mg Subcutaneous Q24H  . fluticasone furoate-vilanterol  1 puff Inhalation Daily  . furosemide  40 mg Oral Once  . gabapentin  300 mg Oral TID  . insulin aspart  0-9 Units Subcutaneous TID WC  . insulin detemir  20 Units Subcutaneous Daily  . loxapine  20 mg Oral QHS  . metoCLOPramide  10 mg Oral  TID AC & HS  . montelukast  10 mg Oral QHS  . niacin  50 mg Oral QHS  . potassium chloride  10 mEq Oral Daily  . primidone  50 mg Oral q morning - 10a  . sertraline  150 mg Oral QHS  . topiramate  100 mg Oral Daily  . topiramate  200 mg Oral QHS   Continuous Infusions:    LOS: 5 days    Time spent: 25 MINS    Jasson Siegmann [facility]  [facility] Pager [phone]. If 7PM-7AM, please contact night-coverage at [email or web address], password [facility] 02/08/2016, 9:48 AM  LOS: 5 days

## 2016-02-09 LAB — GLUCOSE, CAPILLARY
GLUCOSE-CAPILLARY: 220 mg/dL — AB (ref 65–99)
GLUCOSE-CAPILLARY: 202 mg/dL — AB (ref 65–99)
Glucose-Capillary: 160 mg/dL — ABNORMAL HIGH (ref 65–99)
Glucose-Capillary: 177 mg/dL — ABNORMAL HIGH (ref 65–99)

## 2016-02-09 LAB — BASIC METABOLIC PANEL
Anion gap: 11 (ref 5–15)
BUN: 19 mg/dL (ref 6–20)
CHLORIDE: 98 mmol/L — AB (ref 101–111)
CO2: 20 mmol/L — AB (ref 22–32)
Calcium: 8.7 mg/dL — ABNORMAL LOW (ref 8.9–10.3)
Creatinine, Ser: 2.05 mg/dL — ABNORMAL HIGH (ref 0.44–1.00)
GFR calc Af Amer: 29 mL/min — ABNORMAL LOW (ref >60)
GFR calc non Af Amer: 25 mL/min — ABNORMAL LOW (ref >60)
GLUCOSE: 152 mg/dL — AB (ref 65–99)
POTASSIUM: 3.3 mmol/L — AB (ref 3.5–5.1)
Sodium: 129 mmol/L — ABNORMAL LOW (ref 135–145)

## 2016-02-09 LAB — MAGNESIUM: Magnesium: 1.5 mg/dL — ABNORMAL LOW (ref 1.7–2.4)

## 2016-02-09 MED ORDER — FUROSEMIDE 40 MG PO TABS: 40.0000 mg | ORAL_TABLET | Freq: Once | ORAL | Status: DC

## 2016-02-09 MED ORDER — MAGNESIUM SULFATE 2 GM/50ML IV SOLN
2.0000 g | Freq: Once | INTRAVENOUS | Status: AC
  Administered 2016-02-09: 2 g via INTRAVENOUS
  Filled 2016-02-09: qty 50

## 2016-02-09 MED ORDER — POTASSIUM CHLORIDE CRYS ER 20 MEQ PO TBCR
40.0000 meq | EXTENDED_RELEASE_TABLET | Freq: Once | ORAL | Status: AC
  Administered 2016-02-09: 40 meq via ORAL
  Filled 2016-02-09: qty 2

## 2016-02-09 NOTE — Progress Notes (Signed)
[facility] PROGRESS NOTE  Mali Eppard OEU:[id number] DOB: 03/11/1956 DOA: 02/03/2016   PCP: No primary care provider on file.     Assessment/Plan: Principal Problem:   Facial cellulitis Active Problems:   Cellulitis   Diabetes mellitus type 2, controlled (HCC)   Normocytic normochromic anemia   Dementia   Bipolar I disorder (HCC)   Abdominal distention      60 y.o.femalewith diabetes mellitus type 2, dementia, COPD was brought to the ER because of worsening right facial erythema swelling and pain. Patient has been taking oral antibiotics for last 2 days despite which patient's swelling did not improve and was worsening. On exam patient has right facial erythema involving the right maxillary and right periorbital area. CT scan does not show any involvement of the bone or orbits. Patient has been admitted for IV antibiotics. On exam patient is able to move eyes without difficulty.   Assessment and plan 1. Right facial cellulitis- -Completed  IV antibiotics including vancomycin and Zosyn -No growth from blood culture on 9/20. -was given bactrim/cipro with increase in creatine- -change abx to doxy/keflex after speaking with pharmacy 2. Diabetes mellitus type 2- uncontrolled, patient is placed on sliding scale coverage. Hold metformin while inpatient.   hemoglobin A1c 6.2. Patient has been started on Levemir. Diabetes coordinator consult for Levemir/insulin teaching. Daughter states she takes levemir at home,  3. History of dementia on Namenda and Aricept. 4. History of migraineon Topamax. 5. Chronic anemia - follow CBC. 6. Abdominal distension: belly distended with fluid shift,   CT abdomen and pelvis Stable adrenal nodules, no mass, or obstruction. Patient had a bowel movement yesterday.Has a hx of gastroparesis?motility disorder, will need to see GI as outpatient 7. OSA-noncompliant with CPAP, has 2L aof o2 at baseline    8. AKI    -suspect from bactrim      -check FENA -- < 1?  Patient is > 6 L + since admission-- will give low dose lasix and    Patient has diuresed > 4 L    -recheck labs in AM    -I/os    -renal U/S- ok    -urine eosinophils   9. Hyponatremia   -fluid restrict   -BMP in AM  DVT prophylaxsis lovenox   Code Status:  Full code     Family Communication: spoke with granddaughter/daughter 9/25 Disposition Plan:  Await kidney improvement        Subjective Nursing reported yesterday that she was drinking lots of water C/o thirst today after fluid restiction  Objective: Vitals:   02/09/16 0120 02/09/16 0528 02/09/16 0800 02/09/16 0914  BP: 126/61 (!) 119/58 (!) 142/78 139/62  Pulse: 73 65 80 80  Resp: 20 18 18 20   Temp: 99 F (37.2 C) 97.7 F (36.5 C) 98.1 F (36.7 C) 98 F (36.7 C)  TempSrc: Oral Oral Oral Oral  SpO2: 95% 99% 96% 96%  Weight:      Height:        Intake/Output Summary (Last 24 hours) at 02/09/16 1048 Last data filed at 02/09/16 0913  Gross per 24 hour  Intake             1470 ml  Output             3100 ml  Net            -1630 ml    Exam:  Examination:  General exam: calmer today Respiratory system: Clear to auscultation. Respiratory  effort normal. Cardiovascular system: S1 & S2 heard, RRR. No JVD, murmurs, rubs, gallops or clicks. No pedal edema. Gastrointestinal system: Abdomen is  Mildly distended, soft and nontender. No organomegaly or masses felt. Normal bowel sounds heard. Central nervous system: Alert  Skin: skin dry and peeling on face    Data Reviewed: I have personally reviewed following labs and imaging studies  Micro Results Recent Results (from the past 240 hour(s))  Culture, blood (routine x 2)     Status: None   Collection Time: 02/03/16  3:10 PM  Result Value Ref Range Status   Specimen Description BLOOD RIGHT ANTECUBITAL  Final   Special Requests BOTTLES DRAWN AEROBIC AND ANAEROBIC 10 CC EACH  Final   Culture   Final    NO GROWTH 5 DAYS [facility]    Report Status 02/08/2016 FINAL  Final  Culture, blood (routine x 2)     Status: None   Collection Time: 02/03/16  4:30 PM  Result Value Ref Range Status   Specimen Description BLOOD LEFT HAND  Final   Special Requests BOTTLES DRAWN AEROBIC ONLY 4CC  Final   Culture   Final    NO GROWTH 5 DAYS [facility]    Report Status 02/08/2016 FINAL  Final    Radiology Reports Ct Abdomen Pelvis Wo Contrast  Result Date: 02/04/2016 CLINICAL DATA:  Abdominal distention, constipation, dementia, diabetes mellitus, GERD, former smoker, COPD EXAM: CT ABDOMEN AND PELVIS WITHOUT CONTRAST TECHNIQUE: Multidetector CT imaging of the abdomen and pelvis was performed following the standard protocol without IV contrast. Sagittal and coronal MPR images reconstructed from axial data set. COMPARISON:  11/18/2015 FINDINGS: Lower chest: Lung bases clear Hepatobiliary: Liver and gallbladder normal appearance Pancreas: Partial fatty replacement.  Otherwise normal appearance Spleen: Normal appearance Adrenals/Urinary Tract: LEFT adrenal nodule 17 x 14 mm image 30 previously 16 x 15 mm. RIGHT adrenal nodule 14 x 10 mm unchanged. Kidneys, ureters, and bladder normal appearance. Stomach/Bowel: Normal appendix. Gaseous distention of the ascending colon with gas throughout remainder of colon to rectum as well. Stomach and bowel loops otherwise normal appearance. No definite bowel wall thickening or evidence of obstruction. Vascular/Lymphatic: Mild scattered atherosclerotic calcifications without aortic aneurysm. No adenopathy. Normal sized periportal and peripancreatic nodes. Reproductive: Unremarkable uterus and adnexa Other: No free air or free fluid.  No hernia. Musculoskeletal: Degenerative disc disease changes thoracolumbar spine. No acute bone lesions. IMPRESSION: Stable adrenal nodules. Aortic atherosclerosis. Slight gaseous distention of the ascending colon without evidence of bowel  obstruction or wall thickening. Remainder of exam unremarkable. Electronically Signed   By: Ulyses SouthwardMark  Boles M.D.   On: 02/04/2016 17:12   Ct Head Wo Contrast  Result Date: 02/03/2016 CLINICAL DATA:  60 year old female with right facial swelling and redness for 3 days. Possible fall. Initial encounter. EXAM: CT HEAD WITHOUT CONTRAST CT MAXILLOFACIAL WITHOUT CONTRAST TECHNIQUE: Multidetector CT imaging of the head and maxillofacial structures were performed using the standard protocol without intravenous contrast. Multiplanar CT image reconstructions of the maxillofacial structures were also generated. COMPARISON:  [facility] noncontrast head CT 09/05/2005 FINDINGS: CT HEAD FINDINGS Brain: Mild generalized cerebral volume loss, but volume remains within normal limits for age. No midline shift, ventriculomegaly, mass effect, evidence of mass lesion, intracranial hemorrhage or evidence of cortically based acute infarction. Gray-white matter differentiation is within normal limits throughout the brain. Minimal to mild for age nonspecific white matter hypodensity. Vascular: No suspicious intracranial vascular hyperdensity. Skull: No  acute osseous abnormality identified. Sinuses/Orbits: Visualized paranasal sinuses and mastoids are stable and well pneumatized. Other: No acute orbit or scalp soft tissue findings. CT MAXILLOFACIAL FINDINGS Osseous: Absent dentition. Mandible intact. Maxilla intact. No facial fracture identified. Advanced degenerative changes in the visible cervical spine including bulky anterior endplate osteophytosis. No acute osseous abnormality identified. Orbits: Negative. Sinuses: Clear. Soft tissues: Negative visualized noncontrast larynx, pharynx, parapharyngeal spaces, retropharyngeal space, sublingual space, submandibular glands and parotid glands. No fluid collection identified in the face or upper neck. Visible cervical and submandibular lymph nodes are normal. Limited  intracranial: Reported above. IMPRESSION: 1. No acute intracranial abnormality and largely unremarkable for age noncontrast CT appearance of the brain. 2. No acute osseous abnormality or inflammatory process identified in the face. Absent dentition. Clear paranasal sinuses. 3. Cervical spine diffuse idiopathic skeletal hyperostosis. Electronically Signed   By: Odessa Fleming M.D.   On: 02/03/2016 16:13   US Renal  Result Date: 02/08/2016 CLINICAL DATA:  Acute renal insufficiency, history of diabetes. EXAM: RENAL / URINARY TRACT ULTRASOUND COMPLETE COMPARISON:  Noncontrast CT scan of the abdomen and pelvis of February 04, 2016. FINDINGS: Right Kidney: Length: 12.1 cm. Echogenicity remains slightly lower than that of the adjacent liver. No mass or hydronephrosis visualized. Left Kidney: Length: 13.8 cm. Echogenicity is similar to that on the right. Cystic structure in the midpole of the left kidney measures 1.6 x 1.2 x 1.3 cm. Bladder: Appears normal for degree of bladder distention. IMPRESSION: The renal cortical echotexture bilaterally is mildly increased but remains just below that of the liver. There is no hydronephrosis. No acute abnormality of the urinary bladder is observed. Electronically Signed   By: David  [place] M.D.   On: 02/08/2016 10:27   Ct Maxillofacial Wo Contrast  Result Date: 02/03/2016 CLINICAL DATA:  60 year old female with right facial swelling and redness for 3 days. Possible fall. Initial encounter. EXAM: CT HEAD WITHOUT CONTRAST CT MAXILLOFACIAL WITHOUT CONTRAST TECHNIQUE: Multidetector CT imaging of the head and maxillofacial structures were performed using the standard protocol without intravenous contrast. Multiplanar CT image reconstructions of the maxillofacial structures were also generated. COMPARISON:  [facility] noncontrast head CT 09/05/2005 FINDINGS: CT HEAD FINDINGS Brain: Mild generalized cerebral volume loss, but volume remains within normal limits for age. No  midline shift, ventriculomegaly, mass effect, evidence of mass lesion, intracranial hemorrhage or evidence of cortically based acute infarction. Gray-white matter differentiation is within normal limits throughout the brain. Minimal to mild for age nonspecific white matter hypodensity. Vascular: No suspicious intracranial vascular hyperdensity. Skull: No acute osseous abnormality identified. Sinuses/Orbits: Visualized paranasal sinuses and mastoids are stable and well pneumatized. Other: No acute orbit or scalp soft tissue findings. CT MAXILLOFACIAL FINDINGS Osseous: Absent dentition. Mandible intact. Maxilla intact. No facial fracture identified. Advanced degenerative changes in the visible cervical spine including bulky anterior endplate osteophytosis. No acute osseous abnormality identified. Orbits: Negative. Sinuses: Clear. Soft tissues: Negative visualized noncontrast larynx, pharynx, parapharyngeal spaces, retropharyngeal space, sublingual space, submandibular glands and parotid glands. No fluid collection identified in the face or upper neck. Visible cervical and submandibular lymph nodes are normal. Limited intracranial: Reported above. IMPRESSION: 1. No acute intracranial abnormality and largely unremarkable for age noncontrast CT appearance of the brain. 2. No acute osseous abnormality or inflammatory process identified in the face. Absent dentition. Clear paranasal sinuses. 3. Cervical spine diffuse idiopathic skeletal hyperostosis. Electronically Signed   By: Odessa Fleming M.D.   On: 02/03/2016 16:13     CBC  Recent  Labs Lab 02/03/16 1510 02/04/16 0541 02/05/16 0346  WBC 6.4 7.1 7.3  HGB 10.8* 11.4* 10.8*  HCT 33.3* 36.3 35.1*  PLT 182 208 237  MCV 89.5 89.2 91.2  MCH 29.0 28.0 28.1  MCHC 32.4 31.4 30.8  RDW 14.0 13.9 14.3  LYMPHSABS 1.0 1.2  --   MONOABS 0.7 0.8  --   EOSABS 0.2 0.2  --   BASOSABS 0.0 0.0  --     Chemistries   Recent Labs Lab 02/03/16 1510 02/04/16 0541  02/05/16 0346 02/06/16 1048 02/07/16 0346 02/08/16 0714 02/08/16 1449 02/08/16 2125 02/09/16 0235  NA 130* 142 140  --  138 128* 123* 125* 129*  K 3.8 3.8 3.3*  --  3.3* 3.6 3.8 3.7 3.3*  CL 96* 106 105  --  105 98* 94* 94* 98*  CO2 25 24 22   --  24 20* 19* 22 20*  GLUCOSE 234* 169* 191*  --  204* 179* 236* 187* 152*  BUN 5* <5* <5*  --  9 13 15 17 19   CREATININE 0.42* 0.51 0.68  --  1.74* 2.21* 2.08* 2.18* 2.05*  CALCIUM 8.5* 9.2 8.8*  --  8.7* 8.6* 8.3* 8.4* 8.7*  MG  --   --   --  1.4*  --   --   --   --  1.5*  AST 18 18 23   --   --   --   --   --   --   ALT 23 25 29   --   --   --   --   --   --   ALKPHOS 78 86 78  --   --   --   --   --   --   BILITOT 0.3 0.5 0.5  --   --   --   --   --   --    ------------------------------------------------------------------------------------------------------------------ estimated creatinine clearance is 33 mL/min (by C-G formula based on SCr of 2.05 mg/dL (H)). ------------------------------------------------------------------------------------------------------------------ No results for input(s): HGBA1C in the last 72 hours. ------------------------------------------------------------------------------------------------------------------ No results for input(s): CHOL, HDL, LDLCALC, TRIG, CHOLHDL, LDLDIRECT in the last 72 hours. ------------------------------------------------------------------------------------------------------------------ No results for input(s): TSH, T4TOTAL, T3FREE, THYROIDAB in the last 72 hours.  Invalid input(s): FREET3 ------------------------------------------------------------------------------------------------------------------ No results for input(s): VITAMINB12, FOLATE, FERRITIN, TIBC, IRON, RETICCTPCT in the last 72 hours.  Coagulation profile No results for input(s): INR, PROTIME in the last 168 hours.  No results for input(s): DDIMER in the last 72 hours.  Cardiac Enzymes No results for input(s):  CKMB, TROPONINI, MYOGLOBIN in the last 168 hours.  Invalid input(s): CK ------------------------------------------------------------------------------------------------------------------ Invalid input(s): POCBNP   CBG:  Recent Labs Lab 02/08/16 0556 02/08/16 1135 02/08/16 1621 02/08/16 2129 02/09/16 0554  GLUCAP 166* 194* 192* 184* 160*       Studies: US Renal  Result Date: 02/08/2016 CLINICAL DATA:  Acute renal insufficiency, history of diabetes. EXAM: RENAL / URINARY TRACT ULTRASOUND COMPLETE COMPARISON:  Noncontrast CT scan of the abdomen and pelvis of February 04, 2016. FINDINGS: Right Kidney: Length: 12.1 cm. Echogenicity remains slightly lower than that of the adjacent liver. No mass or hydronephrosis visualized. Left Kidney: Length: 13.8 cm. Echogenicity is similar to that on the right. Cystic structure in the midpole of the left kidney measures 1.6 x 1.2 x 1.3 cm. Bladder: Appears normal for degree of bladder distention. IMPRESSION: The renal cortical echotexture bilaterally is mildly increased but remains just below that of the liver. There is no hydronephrosis.  No acute abnormality of the urinary bladder is observed. Electronically Signed   By: David  [place] M.D.   On: 02/08/2016 10:27      Lab Results  Component Value Date   HGBA1C 6.2 (H) 02/04/2016   Lab Results  Component Value Date   CREATININE 2.05 (H) 02/09/2016       Scheduled Meds: . bisacodyl  10 mg Rectal Once  . cephALEXin  500 mg Oral Q12H  . cetaphil   Topical Daily  . memantine  28 mg Oral QHS   And  . donepezil  10 mg Oral QHS  . doxycycline  100 mg Oral Q12H  . enoxaparin (LOVENOX) injection  55 mg Subcutaneous Q24H  . fluticasone furoate-vilanterol  1 puff Inhalation Daily  . gabapentin  300 mg Oral TID  . insulin aspart  0-9 Units Subcutaneous TID WC  . insulin detemir  20 Units Subcutaneous Daily  . loxapine  20 mg Oral QHS  . magnesium sulfate 1 - 4 g bolus IVPB  2 g Intravenous  Once  . metoCLOPramide  10 mg Oral TID AC & HS  . montelukast  10 mg Oral QHS  . niacin  50 mg Oral QHS  . potassium chloride  10 mEq Oral Daily  . primidone  50 mg Oral q morning - 10a  . sertraline  150 mg Oral QHS  . topiramate  100 mg Oral Daily  . topiramate  200 mg Oral QHS   Continuous Infusions:    LOS: 6 days    Time spent: 25 MINS    Thecla Forgione U [facility]  [facility] Pager [phone]. If 7PM-7AM, please contact night-coverage at [email or web address], password [facility] 02/09/2016, 10:48 AM  LOS: 6 days

## 2016-02-10 DIAGNOSIS — L03211 Cellulitis of face: Secondary | ICD-10-CM

## 2016-02-10 DIAGNOSIS — R140 Abdominal distension (gaseous): Secondary | ICD-10-CM

## 2016-02-10 DIAGNOSIS — F319 Bipolar disorder, unspecified: Secondary | ICD-10-CM

## 2016-02-10 DIAGNOSIS — K122 Cellulitis and abscess of mouth: Secondary | ICD-10-CM

## 2016-02-10 LAB — URINALYSIS, ROUTINE W REFLEX MICROSCOPIC
Bilirubin Urine: NEGATIVE
GLUCOSE, UA: NEGATIVE mg/dL
HGB URINE DIPSTICK: NEGATIVE
Ketones, ur: NEGATIVE mg/dL
Nitrite: NEGATIVE
PH: 5 (ref 5.0–8.0)
PROTEIN: NEGATIVE mg/dL
Specific Gravity, Urine: 1.006 (ref 1.005–1.030)

## 2016-02-10 LAB — BASIC METABOLIC PANEL
ANION GAP: 9 (ref 5–15)
BUN: 29 mg/dL — ABNORMAL HIGH (ref 6–20)
CHLORIDE: 111 mmol/L (ref 101–111)
CO2: 21 mmol/L — AB (ref 22–32)
Calcium: 9.1 mg/dL (ref 8.9–10.3)
Creatinine, Ser: 2.31 mg/dL — ABNORMAL HIGH (ref 0.44–1.00)
GFR calc non Af Amer: 21 mL/min — ABNORMAL LOW (ref >60)
GFR, EST AFRICAN AMERICAN: 25 mL/min — AB (ref >60)
Glucose, Bld: 176 mg/dL — ABNORMAL HIGH (ref 65–99)
POTASSIUM: 4.6 mmol/L (ref 3.5–5.1)
Sodium: 141 mmol/L (ref 135–145)

## 2016-02-10 LAB — URINE MICROSCOPIC-ADD ON

## 2016-02-10 LAB — DIFFERENTIAL
BASOS PCT: 0 %
Basophils Absolute: 0 10*3/uL (ref 0.0–0.1)
EOS ABS: 0.1 10*3/uL (ref 0.0–0.7)
EOS PCT: 2 %
LYMPHS ABS: 1.4 10*3/uL (ref 0.7–4.0)
Lymphocytes Relative: 15 %
MONO ABS: 0.7 10*3/uL (ref 0.1–1.0)
MONOS PCT: 8 %
Neutro Abs: 6.9 10*3/uL (ref 1.7–7.7)
Neutrophils Relative %: 75 %

## 2016-02-10 LAB — GLUCOSE, CAPILLARY
GLUCOSE-CAPILLARY: 165 mg/dL — AB (ref 65–99)
Glucose-Capillary: 142 mg/dL — ABNORMAL HIGH (ref 65–99)
Glucose-Capillary: 124 mg/dL — ABNORMAL HIGH (ref 65–99)
Glucose-Capillary: 151 mg/dL — ABNORMAL HIGH (ref 65–99)

## 2016-02-10 LAB — CBC
HEMATOCRIT: 35.6 % — AB (ref 36.0–46.0)
HEMOGLOBIN: 11.1 g/dL — AB (ref 12.0–15.0)
MCH: 28 pg (ref 26.0–34.0)
MCHC: 31.2 g/dL (ref 30.0–36.0)
MCV: 89.7 fL (ref 78.0–100.0)
Platelets: 345 10*3/uL (ref 150–400)
RBC: 3.97 MIL/uL (ref 3.87–5.11)
RDW: 14.6 % (ref 11.5–15.5)
WBC: 9.2 10*3/uL (ref 4.0–10.5)

## 2016-02-10 LAB — URIC ACID: Uric Acid, Serum: 7.1 mg/dL — ABNORMAL HIGH (ref 2.3–6.6)

## 2016-02-10 NOTE — Consult Note (Signed)
Reason for Consult:AKI  Referring Physician: Dr. Willeen Niece [name] is an 60 y.o. female.  HPI: 60 yr female with hx DM, ? Duration, obesity, COPD, bipolar , dementia, GERD, DJD.  Admitted on 9/21 for facial cellulitis after failing outpatient AB.  Tx with Vanco and Zosyn for 3d then Cipro and Septra, and now doxy and Keflex.  She cannot tell me hx of face swelling, length of DM , any hx renal dz,UTIs, stones or FH.  Was noted to be on Doclofenac on admit. Cr on admit .68, tne on 9.24 2 days later 1.74, the next d 2.21, then 2.08, 2.18. 2.05, 2.31 on serial days.   She denies itching or rash, or any systemic sx. Review of systems not obtained due to patient factors.  Not reliable to any hx   Past Medical History:  Diagnosis Date  . Arthritis   . Barrett esophagus   . Bipolar 1 disorder ([place])   . COPD (chronic obstructive pulmonary disease) ([place])   . Dementia   . Diabetes mellitus without complication ([place])   . GERD (gastroesophageal reflux disease)     Past Surgical History:  Procedure Laterality Date  . BREAST CYST EXCISION    . BREAST SURGERY      Family History  Problem Relation Age of Onset  . Dementia Other   . Diabetes Other     Social History:  reports that she has quit smoking. She has never used smokeless tobacco. She reports that she does not drink alcohol or use drugs.  Allergies:  Allergies  Allergen Reactions  . Ciprofloxacin Hives, Itching and Rash  . Macrobid [Nitrofurantoin] Hives, Itching and Rash  . Dextromethorphan-Quinidine Other (See Comments)  . Ziprasidone Hcl Other (See Comments)    Medications:  I have reviewed the patient's current medications. Prior to Admission:  Prescriptions Prior to Admission  Medication Sig Dispense Refill Last Dose  . albuterol (PROVENTIL HFA;VENTOLIN HFA) 108 (90 Base) MCG/ACT inhaler Inhale 2 puffs into the lungs every 6 (six) hours as needed for wheezing or shortness of breath.   Past Month at Unknown time  .  albuterol (PROVENTIL) (2.5 MG/3ML) 0.083% nebulizer solution Take 2.5 mg by nebulization every 4 (four) hours as needed for wheezing or shortness of breath.   Past Month at Unknown time  . atorvastatin (LIPITOR) 20 MG tablet Take 20 mg by mouth at bedtime.   Past Week at Unknown time  . benzonatate (TESSALON) 100 MG capsule Take 100 mg by mouth 2 (two) times daily as needed for cough.    Past Month at Unknown time  . benztropine (COGENTIN) 0.5 MG tablet Take 0.5 mg by mouth every morning.   Past Week at Unknown time  . Carboxymethylcellulose Sodium 1 % GEL Place 1 drop into both eyes at bedtime.   Past Week at Unknown time  . clindamycin (CLEOCIN) 300 MG capsule Take 300 mg by mouth 4 (four) times daily.   Past Week at Unknown time  . clonazePAM (KLONOPIN) 0.5 MG tablet Take 0.5 mg by mouth 3 (three) times daily.    Past Week at Unknown time  . cyclobenzaprine (FLEXERIL) 5 MG tablet Take 5 mg by mouth 3 (three) times daily as needed for muscle spasms.   Past Month at Unknown time  . diclofenac (VOLTAREN) 75 MG EC tablet Take 75 mg by mouth 2 (two) times daily.   Past Week at Unknown time  . ferrous sulfate 325 (65 FE) MG EC tablet Take 325 mg  by mouth daily with breakfast.    Past Week at Unknown time  . fluticasone furoate-vilanterol (BREO ELLIPTA) 100-25 MCG/INH AEPB Inhale 1 puff into the lungs daily.   Past Week at Unknown time  . gabapentin (NEURONTIN) 600 MG tablet Take 600 mg by mouth 3 (three) times daily.    Past Week at Unknown time  . ibuprofen (ADVIL,MOTRIN) 800 MG tablet Take 800 mg by mouth 2 (two) times daily as needed for moderate pain.    Past Week at Unknown time  . lidocaine (XYLOCAINE) 5 % ointment Apply 1 application topically daily as needed for irritation.    Past Month at Unknown time  . loxapine (LOXITANE) 10 MG capsule Take 20 mg by mouth at bedtime.    Past Week at Unknown time  . Melatonin 10 MG TABS Take 20 mg by mouth at bedtime.   Past Week at Unknown time  . Memantine  HCl-Donepezil HCl (NAMZARIC) 28-10 MG CP24 Take 1 capsule by mouth at bedtime.    Past Week at Unknown time  . metFORMIN (GLUCOPHAGE) 1000 MG tablet Take 1,000 mg by mouth 2 (two) times daily with a meal.   Past Week at Unknown time  . metoCLOPramide (REGLAN) 10 MG tablet Take 10 mg by mouth 4 (four) times daily.   Past Week at Unknown time  . montelukast (SINGULAIR) 10 MG tablet Take 10 mg by mouth at bedtime.   Past Week at Unknown time  . Multiple Vitamins-Minerals (CENTRAVITES 50 PLUS) TABS Take 1 tablet by mouth daily.   Past Week at Unknown time  . niacin (NIASPAN) 500 MG CR tablet Take 500 mg by mouth every morning.  2 Past Week at Unknown time  . omeprazole (PRILOSEC) 40 MG capsule Take 40 mg by mouth 2 (two) times daily.   Past Week at Unknown time  . OXYGEN Inhale 2 L into the lungs continuous.    Past Week at Unknown time  . polyvinyl alcohol (LIQUIFILM TEARS) 1.4 % ophthalmic solution Place 1 drop into both eyes every morning.   Past Week at Unknown time  . potassium chloride (K-DUR) 10 MEQ tablet Take 10 mEq by mouth daily.   Past Week at Unknown time  . primidone (MYSOLINE) 50 MG tablet Take 50 mg by mouth every morning.    Past Week at Unknown time  . sertraline (ZOLOFT) 100 MG tablet Take 150 mg by mouth at bedtime.   Past Week at Unknown time  . SSD 1 % cream Apply 1 application topically daily as needed (for wound).    Past Month at Unknown time  . topiramate (TOPAMAX) 100 MG tablet Take 100-200 mg by mouth 2 (two) times daily. 113m in the AM and 2083mQHS    Past Week at Unknown time  . Valproate Sodium (DEPAKENE) 250 MG/5ML SOLN solution Take 1,000 mg by mouth at bedtime.   Past Week at Unknown time     Results for orders placed or performed during the hospital encounter of 02/03/16 (from the past 48 hour(s))  Basic metabolic panel     Status: Abnormal   Collection Time: 02/08/16  2:49 PM  Result Value Ref Range   Sodium 123 (L) 135 - 145 mmol/L   Potassium 3.8 3.5 - 5.1  mmol/L   Chloride 94 (L) 101 - 111 mmol/L   CO2 19 (L) 22 - 32 mmol/L   Glucose, Bld 236 (H) 65 - 99 mg/dL   BUN 15 6 - 20 mg/dL  Creatinine, Ser 2.08 (H) 0.44 - 1.00 mg/dL   Calcium 8.3 (L) 8.9 - 10.3 mg/dL   GFR calc non Af Amer 24 (L) >60 mL/min   GFR calc Af Amer 28 (L) >60 mL/min    Comment: (NOTE) The eGFR has been calculated using the CKD EPI equation. This calculation has not been validated in all clinical situations. eGFR's persistently <60 mL/min signify possible Chronic Kidney Disease.    Anion gap 10 5 - 15  Glucose, capillary     Status: Abnormal   Collection Time: 02/08/16  4:21 PM  Result Value Ref Range   Glucose-Capillary 192 (H) 65 - 99 mg/dL   Comment 1 Notify RN    Comment 2 Document in Chart   Osmolality, urine     Status: Abnormal   Collection Time: 02/08/16  8:45 PM  Result Value Ref Range   Osmolality, Ur 88 (L) 300 - 900 mOsm/kg  Basic metabolic panel     Status: Abnormal   Collection Time: 02/08/16  9:25 PM  Result Value Ref Range   Sodium 125 (L) 135 - 145 mmol/L   Potassium 3.7 3.5 - 5.1 mmol/L   Chloride 94 (L) 101 - 111 mmol/L   CO2 22 22 - 32 mmol/L   Glucose, Bld 187 (H) 65 - 99 mg/dL   BUN 17 6 - 20 mg/dL   Creatinine, Ser 2.18 (H) 0.44 - 1.00 mg/dL   Calcium 8.4 (L) 8.9 - 10.3 mg/dL   GFR calc non Af Amer 23 (L) >60 mL/min   GFR calc Af Amer 27 (L) >60 mL/min    Comment: (NOTE) The eGFR has been calculated using the CKD EPI equation. This calculation has not been validated in all clinical situations. eGFR's persistently <60 mL/min signify possible Chronic Kidney Disease.    Anion gap 9 5 - 15  Osmolality     Status: Abnormal   Collection Time: 02/08/16  9:25 PM  Result Value Ref Range   Osmolality 271 (L) 275 - 295 mOsm/kg  Glucose, capillary     Status: Abnormal   Collection Time: 02/08/16  9:29 PM  Result Value Ref Range   Glucose-Capillary 184 (H) 65 - 99 mg/dL   Comment 1 Notify RN    Comment 2 Document in Chart   Basic  metabolic panel     Status: Abnormal   Collection Time: 02/09/16  2:35 AM  Result Value Ref Range   Sodium 129 (L) 135 - 145 mmol/L   Potassium 3.3 (L) 3.5 - 5.1 mmol/L   Chloride 98 (L) 101 - 111 mmol/L   CO2 20 (L) 22 - 32 mmol/L   Glucose, Bld 152 (H) 65 - 99 mg/dL   BUN 19 6 - 20 mg/dL   Creatinine, Ser 2.05 (H) 0.44 - 1.00 mg/dL   Calcium 8.7 (L) 8.9 - 10.3 mg/dL   GFR calc non Af Amer 25 (L) >60 mL/min   GFR calc Af Amer 29 (L) >60 mL/min    Comment: (NOTE) The eGFR has been calculated using the CKD EPI equation. This calculation has not been validated in all clinical situations. eGFR's persistently <60 mL/min signify possible Chronic Kidney Disease.    Anion gap 11 5 - 15  Magnesium     Status: Abnormal   Collection Time: 02/09/16  2:35 AM  Result Value Ref Range   Magnesium 1.5 (L) 1.7 - 2.4 mg/dL  Glucose, capillary     Status: Abnormal   Collection Time: 02/09/16  5:54  AM  Result Value Ref Range   Glucose-Capillary 160 (H) 65 - 99 mg/dL   Comment 1 Notify RN    Comment 2 Document in Chart   Glucose, capillary     Status: Abnormal   Collection Time: 02/09/16 11:40 AM  Result Value Ref Range   Glucose-Capillary 220 (H) 65 - 99 mg/dL   Comment 1 Notify RN    Comment 2 Document in Chart   Glucose, capillary     Status: Abnormal   Collection Time: 02/09/16  4:01 PM  Result Value Ref Range   Glucose-Capillary 202 (H) 65 - 99 mg/dL   Comment 1 Notify RN    Comment 2 Document in Chart   Glucose, capillary     Status: Abnormal   Collection Time: 02/09/16 10:18 PM  Result Value Ref Range   Glucose-Capillary 177 (H) 65 - 99 mg/dL   Comment 1 Notify RN    Comment 2 Document in Chart   Basic metabolic panel     Status: Abnormal   Collection Time: 02/10/16  6:45 AM  Result Value Ref Range   Sodium 141 135 - 145 mmol/L   Potassium 4.6 3.5 - 5.1 mmol/L   Chloride 111 101 - 111 mmol/L   CO2 21 (L) 22 - 32 mmol/L   Glucose, Bld 176 (H) 65 - 99 mg/dL   BUN 29 (H) 6 - 20  mg/dL   Creatinine, Ser 2.31 (H) 0.44 - 1.00 mg/dL   Calcium 9.1 8.9 - 10.3 mg/dL   GFR calc non Af Amer 21 (L) >60 mL/min   GFR calc Af Amer 25 (L) >60 mL/min    Comment: (NOTE) The eGFR has been calculated using the CKD EPI equation. This calculation has not been validated in all clinical situations. eGFR's persistently <60 mL/min signify possible Chronic Kidney Disease.    Anion gap 9 5 - 15  Glucose, capillary     Status: Abnormal   Collection Time: 02/10/16  6:45 AM  Result Value Ref Range   Glucose-Capillary 165 (H) 65 - 99 mg/dL  Glucose, capillary     Status: Abnormal   Collection Time: 02/10/16 11:35 AM  Result Value Ref Range   Glucose-Capillary 142 (H) 65 - 99 mg/dL   Comment 1 Notify RN    Comment 2 Document in Chart     No results found.  ROS Blood pressure (!) 145/73, pulse 91, temperature 97.5 F (36.4 C), temperature source Oral, resp. rate 18, height '5\' 2"'  (1.575 m), weight 108.5 kg (239 lb 3.2 oz), SpO2 98 %. Physical Exam Physical Examination: General appearance - overweight and confused but pleasant Mental status - cooperative but only Ox1 Eyes - pupils equal and reactive, extraocular eye movements intact, funduscopic exam normal, discs flat and sharp Mouth - edentulous Neck - adenopathy noted PCL Lymphatics - posterior cervical nodes Chest - decreased air entry noted bilat, no R,R, W Heart - normal rate, regular rhythm, normal S1, S2, no murmurs, rubs, clicks or gallops, S1 and S2 normal, systolic murmur ZH0/8 at apex Abdomen - obese, soft, no organomeg Neurological - as above, also pill rolling CM 2-12 intact, mtr 5/5,  Musculoskeletal - no joint tenderness, deformity or swelling Extremities - tr edema Skin - erythematous urticaria on legs  Assessment/Plan: 1 AKI Cr rose within 2 d of admit indic early injury &/or preexisting predisposition.  Suspect Vanco but residual effect of NSAIDs not r/o.  Received septra which inhib secretion of Cr and can  causes AIN  but not typical time course. Worrisome is previous allergy to Cipro and getting that esp with hives.  Hives most likely allergic in origin . Cr stable and should get better with time.  Clearly Vanc/Zosyn played a role but cannot r/o additive effect of others. Will check eos, Comp and if pos consider steroids but not usually pos with Vanco.  Vol /acid/base/k ok 2 Cellulitis resolved 3 DM controlled 4. Anemia  5. Obesity 6Dementia 7 Extrapyramidal movements 8 Bipolar P eos, UA, complements, diff.    Khalfani Weideman L 02/10/2016, 1:54 PM

## 2016-02-10 NOTE — Progress Notes (Signed)
[facility]                                                                              Patient Demographics  Stephanie [name], is a 60 y.o. female, DOB - 03-28-1956, ZOX:[id number]  Admit date - 02/03/2016   Admitting Physician Bobette Mo, MD  Outpatient Primary MD for the patient is No primary care provider on file.  Outpatient specialists:   LOS - 7  days    Chief Complaint  Patient presents with  . Facial Swelling       Brief summary   60 y.o.femalewith diabetes mellitus type 2, dementia, COPD was brought to the ER because of worsening right facial erythema swelling and pain. Patient has been taking oral antibiotics for last 2 days despite which patient's swelling did not improve and was worsening. On exam patient has right facial erythema involving the right maxillary and right periorbital area. CT scan does not show any involvement of the bone or orbits. Patient was admitted for IV antibiotics   Assessment & Plan    Principal Problem:   Right Facial cellulitis - Significantly improving, patient was initially placed on IV vancomycin and Zosyn which she has completed. Blood cultures negative to date - She was transitioned to Bactrim, Cipro which was subsequently changed to doxycycline and Keflex due to increase in creatinine - For now will continue doxycycline and Keflex only  Active Problems: Acute renal insufficiency - Possibly from Bactrim, patient also received low-dose Lasix during this hospitalization for volume overload, was also on vancomycin - Renal ultrasound negative, showed no hydronephrosis - Creatinine continues to trend up, consulted nephrology     Diabetes mellitus type 2, controlled (HCC) - Continue sliding scale insulin, Levemir - Hemoglobin A1c 6.2    Normocytic normochromic anemia - Currently H&H stable, follow closely    Dementia - Continue Namenda, Aricept  Abdominal distention - Per patient improving, CT  abdomen and pelvis showed stable adrenal nodule otherwise no mass or obstruction.  Code Status: Full code DVT Prophylaxis:  Lovenox  Family Communication: Discussed in detail with the patient, all imaging results, lab results explained to the patient   Disposition Plan: Hopefully DC in am if Cr Improving, she really wants to go home   Time Spent in minutes  25 minutes  Procedures:  Renal ultrasound  Consultants:   Nephrology  Antimicrobials:      Medications  Scheduled Meds: . bisacodyl  10 mg Rectal Once  . cephALEXin  500 mg Oral Q12H  . cetaphil   Topical Daily  . memantine  28 mg Oral QHS   And  . donepezil  10 mg Oral QHS  . doxycycline  100 mg Oral Q12H  . enoxaparin (LOVENOX) injection  55 mg Subcutaneous Q24H  . fluticasone furoate-vilanterol  1 puff Inhalation Daily  . gabapentin  300 mg Oral TID  . insulin aspart  0-9 Units Subcutaneous TID WC  . insulin detemir  20 Units Subcutaneous Daily  . loxapine  20 mg Oral QHS  . metoCLOPramide  10 mg Oral TID AC & HS  . montelukast  10 mg Oral QHS  .  niacin  50 mg Oral QHS  . primidone  50 mg Oral q morning - 10a  . sertraline  150 mg Oral QHS  . topiramate  100 mg Oral Daily  . topiramate  200 mg Oral QHS   Continuous Infusions:  PRN Meds:.acetaminophen **OR** acetaminophen, albuterol, clonazePAM, ondansetron **OR** ondansetron (ZOFRAN) IV   Antibiotics   Anti-infectives    Start     Dose/Rate Route Frequency Ordered Stop   02/07/16 1000  doxycycline (VIBRA-TABS) tablet 100 mg     100 mg Oral Every 12 hours 02/07/16 0924     02/07/16 1000  cephALEXin (KEFLEX) capsule 500 mg     500 mg Oral Every 12 hours 02/07/16 0931     02/06/16 1200  sulfamethoxazole-trimethoprim (BACTRIM DS,SEPTRA DS) 800-160 MG per tablet 1 tablet  Status:  Discontinued     1 tablet Oral Every 12 hours 02/06/16 1039 02/07/16 0924   02/06/16 1200  ciprofloxacin (CIPRO) tablet 500 mg  Status:  Discontinued     500 mg Oral 2 times  daily 02/06/16 1045 02/07/16 0924   02/04/16 1400  piperacillin-tazobactam (ZOSYN) IVPB 3.375 g  Status:  Discontinued     3.375 g 12.5 mL/hr over 240 Minutes Intravenous Every 8 hours 02/04/16 0245 02/06/16 1052   02/04/16 0700  vancomycin (VANCOCIN) IVPB 750 mg/150 ml premix  Status:  Discontinued     750 mg 150 mL/hr over 60 Minutes Intravenous Every 12 hours 02/03/16 1614 02/06/16 1039   02/04/16 0300  piperacillin-tazobactam (ZOSYN) IVPB 3.375 g     3.375 g 100 mL/hr over 30 Minutes Intravenous  Once 02/04/16 0240 02/04/16 0338   02/04/16 0245  vancomycin (VANCOCIN) IVPB 1000 mg/200 mL premix  Status:  Discontinued     1,000 mg 200 mL/hr over 60 Minutes Intravenous  Once 02/04/16 0240 02/04/16 0242   02/03/16 1800  vancomycin (VANCOCIN) IVPB 1000 mg/200 mL premix  Status:  Discontinued     1,000 mg 200 mL/hr over 60 Minutes Intravenous  Once 02/03/16 1612 02/03/16 1645   02/03/16 1700  vancomycin (VANCOCIN) 2,000 mg in sodium chloride 0.9 % 500 mL IVPB     2,000 mg 250 mL/hr over 120 Minutes Intravenous  Once 02/03/16 1646 02/03/16 1856   02/03/16 1615  vancomycin (VANCOCIN) 2,000 mg in sodium chloride 0.9 % 500 mL IVPB  Status:  Discontinued     2,000 mg 250 mL/hr over 120 Minutes Intravenous  Once 02/03/16 1600 02/03/16 1610   02/03/16 1615  vancomycin (VANCOCIN) IVPB 1000 mg/200 mL premix  Status:  Discontinued     1,000 mg 200 mL/hr over 60 Minutes Intravenous  Once 02/03/16 1612 02/03/16 1645   02/03/16 1607  vancomycin (VANCOCIN) 500 MG powder    Comments:  Lauro Regulus   : cabinet override      02/03/16 1607 02/04/16 0414        Subjective:   Stephanie [name] was seen and examined today.  Facial cellulitis improving. No fevers. Patient denies dizziness, chest pain, shortness of breath, abdominal pain, N/V/D/C, new weakness, numbess, tingling. No acute events overnight.    Objective:   Vitals:   02/10/16 0458 02/10/16 0912 02/10/16 1156 02/10/16 1400  BP: 138/61 (!)  145/73  (!) 162/75  Pulse: 73 91  76  Resp: 18 18  18   Temp: 98.6 F (37 C) 97.5 F (36.4 C)  97.5 F (36.4 C)  TempSrc: Oral Oral  Oral  SpO2: 99% 95% 98% 100%  Weight:  Height:        Intake/Output Summary (Last 24 hours) at 02/10/16 1440 Last data filed at 02/10/16 1401  Gross per 24 hour  Intake              220 ml  Output             3050 ml  Net            -2830 ml     Wt Readings from Last 3 Encounters:  02/04/16 108.5 kg (239 lb 3.2 oz)     Exam  General: Alert and oriented x 3, NAD  HEENT:  PERRLA, EOMI, Anicteric Sclera, Facial cellulitis on the right improving  Neck: Supple, no JVD  Cardiovascular: S1 S2 auscultated, no rubs, murmurs or gallops. Regular rate and rhythm.  Respiratory: Clear to auscultation bilaterally, no wheezing, rales or rhonchi  Gastrointestinal: Soft, nontender, nondistended, + bowel sounds  Ext: no cyanosis clubbing or edema  Neuro: AAOx3, Cr N's II- XII. Strength 5/5 upper and lower extremities bilaterally  Skin: No rashes  Psych: Normal affect and demeanor, alert and oriented x3    Data Reviewed:  I have personally reviewed following labs and imaging studies  Micro Results Recent Results (from the past 240 hour(s))  Culture, blood (routine x 2)     Status: None   Collection Time: 02/03/16  3:10 PM  Result Value Ref Range Status   Specimen Description BLOOD RIGHT ANTECUBITAL  Final   Special Requests BOTTLES DRAWN AEROBIC AND ANAEROBIC 10 CC EACH  Final   Culture   Final    NO GROWTH 5 DAYS Performed [facility]    Report Status 02/08/2016 FINAL  Final  Culture, blood (routine x 2)     Status: None   Collection Time: 02/03/16  4:30 PM  Result Value Ref Range Status   Specimen Description BLOOD LEFT HAND  Final   Special Requests BOTTLES DRAWN AEROBIC ONLY 4CC  Final   Culture   Final    NO GROWTH 5 DAYS Performed at [facility]     Report Status 02/08/2016 FINAL  Final    Radiology  Reports Ct Abdomen Pelvis Wo Contrast  Result Date: 02/04/2016 CLINICAL DATA:  Abdominal distention, constipation, dementia, diabetes mellitus, GERD, former smoker, COPD EXAM: CT ABDOMEN AND PELVIS WITHOUT CONTRAST TECHNIQUE: Multidetector CT imaging of the abdomen and pelvis was performed following the standard protocol without IV contrast. Sagittal and coronal MPR images reconstructed from axial data set. COMPARISON:  11/18/2015 FINDINGS: Lower chest: Lung bases clear Hepatobiliary: Liver and gallbladder normal appearance Pancreas: Partial fatty replacement.  Otherwise normal appearance Spleen: Normal appearance Adrenals/Urinary Tract: LEFT adrenal nodule 17 x 14 mm image 30 previously 16 x 15 mm. RIGHT adrenal nodule 14 x 10 mm unchanged. Kidneys, ureters, and bladder normal appearance. Stomach/Bowel: Normal appendix. Gaseous distention of the ascending colon with gas throughout remainder of colon to rectum as well. Stomach and bowel loops otherwise normal appearance. No definite bowel wall thickening or evidence of obstruction. Vascular/Lymphatic: Mild scattered atherosclerotic calcifications without aortic aneurysm. No adenopathy. Normal sized periportal and peripancreatic nodes. Reproductive: Unremarkable uterus and adnexa Other: No free air or free fluid.  No hernia. Musculoskeletal: Degenerative disc disease changes thoracolumbar spine. No acute bone lesions. IMPRESSION: Stable adrenal nodules. Aortic atherosclerosis. Slight gaseous distention of the ascending colon without evidence of bowel obstruction or wall thickening. Remainder of exam unremarkable. Electronically Signed   By: Ulyses SouthwardMark  Boles M.D.   On: 02/04/2016  17:12   Ct Head Wo Contrast  Result Date: 02/03/2016 CLINICAL DATA:  60 year old female with right facial swelling and redness for 3 days. Possible fall. Initial encounter. EXAM: CT HEAD WITHOUT CONTRAST CT MAXILLOFACIAL WITHOUT CONTRAST TECHNIQUE: Multidetector CT imaging of the head and  maxillofacial structures were performed using the standard protocol without intravenous contrast. Multiplanar CT image reconstructions of the maxillofacial structures were also generated. COMPARISON:  [facility] noncontrast head CT 09/05/2005 FINDINGS: CT HEAD FINDINGS Brain: Mild generalized cerebral volume loss, but volume remains within normal limits for age. No midline shift, ventriculomegaly, mass effect, evidence of mass lesion, intracranial hemorrhage or evidence of cortically based acute infarction. Gray-white matter differentiation is within normal limits throughout the brain. Minimal to mild for age nonspecific white matter hypodensity. Vascular: No suspicious intracranial vascular hyperdensity. Skull: No acute osseous abnormality identified. Sinuses/Orbits: Visualized paranasal sinuses and mastoids are stable and well pneumatized. Other: No acute orbit or scalp soft tissue findings. CT MAXILLOFACIAL FINDINGS Osseous: Absent dentition. Mandible intact. Maxilla intact. No facial fracture identified. Advanced degenerative changes in the visible cervical spine including bulky anterior endplate osteophytosis. No acute osseous abnormality identified. Orbits: Negative. Sinuses: Clear. Soft tissues: Negative visualized noncontrast larynx, pharynx, parapharyngeal spaces, retropharyngeal space, sublingual space, submandibular glands and parotid glands. No fluid collection identified in the face or upper neck. Visible cervical and submandibular lymph nodes are normal. Limited intracranial: Reported above. IMPRESSION: 1. No acute intracranial abnormality and largely unremarkable for age noncontrast CT appearance of the brain. 2. No acute osseous abnormality or inflammatory process identified in the face. Absent dentition. Clear paranasal sinuses. 3. Cervical spine diffuse idiopathic skeletal hyperostosis. Electronically Signed   By: Odessa Fleming M.D.   On: 02/03/2016 16:13   US Renal  Result Date:  02/08/2016 CLINICAL DATA:  Acute renal insufficiency, history of diabetes. EXAM: RENAL / URINARY TRACT ULTRASOUND COMPLETE COMPARISON:  Noncontrast CT scan of the abdomen and pelvis of February 04, 2016. FINDINGS: Right Kidney: Length: 12.1 cm. Echogenicity remains slightly lower than that of the adjacent liver. No mass or hydronephrosis visualized. Left Kidney: Length: 13.8 cm. Echogenicity is similar to that on the right. Cystic structure in the midpole of the left kidney measures 1.6 x 1.2 x 1.3 cm. Bladder: Appears normal for degree of bladder distention. IMPRESSION: The renal cortical echotexture bilaterally is mildly increased but remains just below that of the liver. There is no hydronephrosis. No acute abnormality of the urinary bladder is observed. Electronically Signed   By: David  [place] M.D.   On: 02/08/2016 10:27   Ct Maxillofacial Wo Contrast  Result Date: 02/03/2016 CLINICAL DATA:  60 year old female with right facial swelling and redness for 3 days. Possible fall. Initial encounter. EXAM: CT HEAD WITHOUT CONTRAST CT MAXILLOFACIAL WITHOUT CONTRAST TECHNIQUE: Multidetector CT imaging of the head and maxillofacial structures were performed using the standard protocol without intravenous contrast. Multiplanar CT image reconstructions of the maxillofacial structures were also generated. COMPARISON:  [facility] noncontrast head CT 09/05/2005 FINDINGS: CT HEAD FINDINGS Brain: Mild generalized cerebral volume loss, but volume remains within normal limits for age. No midline shift, ventriculomegaly, mass effect, evidence of mass lesion, intracranial hemorrhage or evidence of cortically based acute infarction. Gray-white matter differentiation is within normal limits throughout the brain. Minimal to mild for age nonspecific white matter hypodensity. Vascular: No suspicious intracranial vascular hyperdensity. Skull: No acute osseous abnormality identified. Sinuses/Orbits: Visualized  paranasal sinuses and mastoids are stable and well pneumatized. Other: No acute orbit  or scalp soft tissue findings. CT MAXILLOFACIAL FINDINGS Osseous: Absent dentition. Mandible intact. Maxilla intact. No facial fracture identified. Advanced degenerative changes in the visible cervical spine including bulky anterior endplate osteophytosis. No acute osseous abnormality identified. Orbits: Negative. Sinuses: Clear. Soft tissues: Negative visualized noncontrast larynx, pharynx, parapharyngeal spaces, retropharyngeal space, sublingual space, submandibular glands and parotid glands. No fluid collection identified in the face or upper neck. Visible cervical and submandibular lymph nodes are normal. Limited intracranial: Reported above. IMPRESSION: 1. No acute intracranial abnormality and largely unremarkable for age noncontrast CT appearance of the brain. 2. No acute osseous abnormality or inflammatory process identified in the face. Absent dentition. Clear paranasal sinuses. 3. Cervical spine diffuse idiopathic skeletal hyperostosis. Electronically Signed   By: Odessa Fleming M.D.   On: 02/03/2016 16:13    Lab Data:  CBC:  Recent Labs Lab 02/03/16 1510 02/04/16 0541 02/05/16 0346  WBC 6.4 7.1 7.3  NEUTROABS 4.5 4.8  --   HGB 10.8* 11.4* 10.8*  HCT 33.3* 36.3 35.1*  MCV 89.5 89.2 91.2  PLT 182 208 237   Basic Metabolic Panel:  Recent Labs Lab 02/06/16 1048  02/08/16 0714 02/08/16 1449 02/08/16 2125 02/09/16 0235 02/10/16 0645  NA  --   < > 128* 123* 125* 129* 141  K  --   < > 3.6 3.8 3.7 3.3* 4.6  CL  --   < > 98* 94* 94* 98* 111  CO2  --   < > 20* 19* 22 20* 21*  GLUCOSE  --   < > 179* 236* 187* 152* 176*  BUN  --   < > 13 15 17 19  29*  CREATININE  --   < > 2.21* 2.08* 2.18* 2.05* 2.31*  CALCIUM  --   < > 8.6* 8.3* 8.4* 8.7* 9.1  MG 1.4*  --   --   --   --  1.5*  --   < > = values in this interval not displayed. GFR: Estimated Creatinine Clearance: 29.3 mL/min (by C-G formula based on  SCr of 2.31 mg/dL (H)). Liver Function Tests:  Recent Labs Lab 02/03/16 1510 02/04/16 0541 02/05/16 0346  AST 18 18 23   ALT 23 25 29   ALKPHOS 78 86 78  BILITOT 0.3 0.5 0.5  PROT 7.0 7.1 6.6  ALBUMIN 3.2* 3.1* 2.9*   No results for input(s): LIPASE, AMYLASE in the last 168 hours. No results for input(s): AMMONIA in the last 168 hours. Coagulation Profile: No results for input(s): INR, PROTIME in the last 168 hours. Cardiac Enzymes: No results for input(s): CKTOTAL, CKMB, CKMBINDEX, TROPONINI in the last 168 hours. BNP (last 3 results) No results for input(s): PROBNP in the last 8760 hours. HbA1C: No results for input(s): HGBA1C in the last 72 hours. CBG:  Recent Labs Lab 02/09/16 1140 02/09/16 1601 02/09/16 2218 02/10/16 0645 02/10/16 1135  GLUCAP 220* 202* 177* 165* 142*   Lipid Profile: No results for input(s): CHOL, HDL, LDLCALC, TRIG, CHOLHDL, LDLDIRECT in the last 72 hours. Thyroid Function Tests: No results for input(s): TSH, T4TOTAL, FREET4, T3FREE, THYROIDAB in the last 72 hours. Anemia Panel: No results for input(s): VITAMINB12, FOLATE, FERRITIN, TIBC, IRON, RETICCTPCT in the last 72 hours. Urine analysis:    Component Value Date/Time   COLORURINE YELLOW 02/07/2016 1119   APPEARANCEUR CLEAR 02/07/2016 1119   LABSPEC <1.005 (L) 02/07/2016 1119   PHURINE 5.5 02/07/2016 1119   GLUCOSEU NEGATIVE 02/07/2016 1119   HGBUR TRACE (A) 02/07/2016 1119   BILIRUBINUR  NEGATIVE 02/07/2016 1119   KETONESUR NEGATIVE 02/07/2016 1119   PROTEINUR NEGATIVE 02/07/2016 1119   NITRITE NEGATIVE 02/07/2016 1119   LEUKOCYTESUR MODERATE (A) 02/07/2016 1119     Shiree Altemus M.D. [facility] 02/10/2016, 2:40 PM  Pager: [phone] Between 7am to 7pm - call Pager - [phone]  After 7pm go to [email or web address] - password [email or web address]  Call night coverage person covering after 7pm

## 2016-02-10 NOTE — Progress Notes (Addendum)
Inpatient Diabetes Program Recommendations  AACE/ADA: New Consensus Statement on Inpatient Glycemic Control (2015)  Target Ranges:  Prepandial:   less than 140 mg/dL      Peak postprandial:   less than 180 mg/dL (1-2 hours)      Critically ill patients:  140 - 180 mg/dL   Lab Results  Component Value Date   GLUCAP 142 (H) 02/10/2016   HGBA1C 6.2 (H) 02/04/2016    Review of Glycemic Control:  Results for [facility], [place] (MRN [id number]) as of 02/10/2016 14:23  Ref. Range 02/09/2016 11:40 02/09/2016 16:01 02/09/2016 22:18 02/10/2016 06:45 02/10/2016 11:35  Glucose-Capillary Latest Ref Range: 65 - 99 mg/dL 621220 (H) 308202 (H) 657177 (H) 165 (H) 142 (H)   Inpatient Diabetes Program Recommendations:    Blood sugars well controlled on Levemir 20 units daily in the hospital. However note that patient was not taking insulin prior to admit and A1C was okay. Note that patient was on Metformin prior admission however BUN/Creat. elevated.   Upon discharge, consider Levemir 15 units daily and do not resume metformin with follow-up with PCP.  Thanks, Beryl MeagerJenny Nichola Warren, RN, BC-ADM Inpatient Diabetes Coordinator Pager [phone] (8a-5p)

## 2016-02-10 NOTE — Care Management Important Message (Signed)
Important Message  Patient Details  Name: Rowe RobertDeborah Lage MRN: [id number] Date of Birth: 1955-07-20   Medicare Important Message Given:  Yes    Kamica Florance Stefan ChurchBratton 02/10/2016, 10:19 AM

## 2016-02-11 DIAGNOSIS — Z794 Long term (current) use of insulin: Secondary | ICD-10-CM

## 2016-02-11 DIAGNOSIS — E1165 Type 2 diabetes mellitus with hyperglycemia: Secondary | ICD-10-CM

## 2016-02-11 LAB — C3 COMPLEMENT: C3 COMPLEMENT: 163 mg/dL (ref 82–167)

## 2016-02-11 LAB — GLUCOSE, CAPILLARY
GLUCOSE-CAPILLARY: 158 mg/dL — AB (ref 65–99)
Glucose-Capillary: 160 mg/dL — ABNORMAL HIGH (ref 65–99)

## 2016-02-11 LAB — RENAL FUNCTION PANEL
Albumin: 2.8 g/dL — ABNORMAL LOW (ref 3.5–5.0)
Anion gap: 8 (ref 5–15)
BUN: 35 mg/dL — AB (ref 6–20)
CHLORIDE: 113 mmol/L — AB (ref 101–111)
CO2: 19 mmol/L — AB (ref 22–32)
CREATININE: 2.11 mg/dL — AB (ref 0.44–1.00)
Calcium: 8.9 mg/dL (ref 8.9–10.3)
GFR calc Af Amer: 28 mL/min — ABNORMAL LOW (ref >60)
GFR, EST NON AFRICAN AMERICAN: 24 mL/min — AB (ref >60)
Glucose, Bld: 172 mg/dL — ABNORMAL HIGH (ref 65–99)
Phosphorus: 6 mg/dL — ABNORMAL HIGH (ref 2.5–4.6)
Potassium: 3.6 mmol/L (ref 3.5–5.1)
SODIUM: 140 mmol/L (ref 135–145)

## 2016-02-11 LAB — C4 COMPLEMENT: COMPLEMENT C4, BODY FLUID: 40 mg/dL (ref 14–44)

## 2016-02-11 MED ORDER — DOXYCYCLINE HYCLATE 100 MG PO TABS: 100.0000 mg | ORAL_TABLET | Freq: Two times a day (BID) | ORAL | 0 refills | Status: DC

## 2016-02-11 MED ORDER — GABAPENTIN 300 MG PO CAPS: 300.0000 mg | ORAL_CAPSULE | Freq: Three times a day (TID) | ORAL | 0 refills | Status: DC

## 2016-02-11 MED ORDER — HYDROCORTISONE 1 % EX OINT
TOPICAL_OINTMENT | Freq: Two times a day (BID) | CUTANEOUS | Status: DC
  Filled 2016-02-11: qty 28.35

## 2016-02-11 MED ORDER — HYDROCORTISONE 1 % EX OINT: TOPICAL_OINTMENT | Freq: Two times a day (BID) | CUTANEOUS | 1 refills | Status: DC

## 2016-02-11 MED ORDER — INSULIN LISPRO 100 UNIT/ML ~~LOC~~ SOLN: 0.0000 [IU] | Freq: Three times a day (TID) | SUBCUTANEOUS | 11 refills | Status: DC

## 2016-02-11 NOTE — Progress Notes (Signed)
Joyce GrossKay, patient daughter called beginning of shift checking on patient via staff after phone conversation with her mom; requested she get her "gabapentin and clonopin at that time as she seems anxious/upset."  Per daughter, clonopin to be scheduled three times a not (not PRN TID).  Will continue to monitor.

## 2016-02-11 NOTE — Progress Notes (Signed)
Patient is being d/c home. Dc instructions given to caregiver, who verbalized understanding. Patient left unit in wheel chair by volunteers.

## 2016-02-11 NOTE — Discharge Summary (Signed)
Physician Discharge Summary   Patient ID: Stephanie Rice MRN: [id number] DOB/AGE: 10/21/1955 60 y.o.  Admit date: 02/03/2016 Discharge date: 02/11/2016  Primary Care Physician:  Roxanne Mins, PA-C  Discharge Diagnoses:    . Facial Cellulitis resolved . Normocytic normochromic anemia . Dementia . Bipolar I disorder (HCC)    Acute kidney injury suspect acute interstitial nephritis     Diabetes mellitus   Consults:  Nephrology, Dr. Darrick Penna  Recommendations for Outpatient Follow-up:  1. Please repeat CBC/BMET on Monday, this was explained in detail to the patient's daughter 2. NSAIDs, metformin discontinued, gabapentin decreased   DIET: Carb modified diet    Allergies:   Allergies  Allergen Reactions  . Ciprofloxacin Hives, Itching and Rash  . Macrobid [Nitrofurantoin] Hives, Itching and Rash  . Dextromethorphan-Quinidine Other (See Comments)  . Ziprasidone Hcl Other (See Comments)     DISCHARGE MEDICATIONS: Current Discharge Medication List    START taking these medications   Details  doxycycline (VIBRA-TABS) 100 MG tablet Take 1 tablet (100 mg total) by mouth 2 (two) times daily. X 5 days Qty: 10 tablet, Refills: 0    gabapentin (NEURONTIN) 300 MG capsule Take 1 capsule (300 mg total) by mouth 3 (three) times daily. Qty: 90 capsule, Refills: 0    hydrocortisone 1 % ointment Apply topically 2 (two) times daily. Apply to the rash twice a day until resolved Qty: 30 g, Refills: 1      CONTINUE these medications which have CHANGED   Details  insulin lispro (HUMALOG) 100 UNIT/ML injection Inject 0-0.09 mLs (0-9 Units total) into the skin 3 (three) times daily before meals. Sliding scale CBG 70 - 120: 0 units CBG 121 - 150: 1 unit,  CBG 151 - 200: 2 units,  CBG 201 - 250: 3 units,  CBG 251 - 300: 5 units,  CBG 301 - 350: 7 units,  CBG 351 - 400: 9 units   CBG > 400: 9 units and notify your MD  Can substitute vial with flex pen if cheaper Qty: 10 mL, Refills:  11      CONTINUE these medications which have NOT CHANGED   Details  albuterol (PROVENTIL HFA;VENTOLIN HFA) 108 (90 Base) MCG/ACT inhaler Inhale 2 puffs into the lungs every 6 (six) hours as needed for wheezing or shortness of breath.    albuterol (PROVENTIL) (2.5 MG/3ML) 0.083% nebulizer solution Take 2.5 mg by nebulization every 4 (four) hours as needed for wheezing or shortness of breath.    atorvastatin (LIPITOR) 20 MG tablet Take 20 mg by mouth at bedtime.    benztropine (COGENTIN) 0.5 MG tablet Take 0.5 mg by mouth every morning.    Carboxymethylcellulose Sodium 1 % GEL Place 1 drop into both eyes at bedtime.    clonazePAM (KLONOPIN) 0.5 MG tablet Take 0.5 mg by mouth 3 (three) times daily.     cyclobenzaprine (FLEXERIL) 5 MG tablet Take 5 mg by mouth 3 (three) times daily as needed for muscle spasms.    ferrous sulfate 325 (65 FE) MG EC tablet Take 325 mg by mouth daily with breakfast.     fluticasone furoate-vilanterol (BREO ELLIPTA) 100-25 MCG/INH AEPB Inhale 1 puff into the lungs daily.    insulin detemir (LEVEMIR) 100 UNIT/ML injection Inject 20 Units into the skin daily.     lidocaine (XYLOCAINE) 5 % ointment Apply 1 application topically daily as needed for irritation.     loxapine (LOXITANE) 10 MG capsule Take 20 mg by mouth at bedtime.  Melatonin 10 MG TABS Take 20 mg by mouth at bedtime.    Memantine HCl-Donepezil HCl (NAMZARIC) 28-10 MG CP24 Take 1 capsule by mouth at bedtime.     metoCLOPramide (REGLAN) 10 MG tablet Take 10 mg by mouth 4 (four) times daily.    montelukast (SINGULAIR) 10 MG tablet Take 10 mg by mouth at bedtime.    Multiple Vitamins-Minerals (CENTRAVITES 50 PLUS) TABS Take 1 tablet by mouth daily.    niacin (NIASPAN) 500 MG CR tablet Take 500 mg by mouth every morning. Refills: 2    omeprazole (PRILOSEC) 40 MG capsule Take 40 mg by mouth 2 (two) times daily.    OXYGEN Inhale 2 L into the lungs continuous.     polyvinyl alcohol  (LIQUIFILM TEARS) 1.4 % ophthalmic solution Place 1 drop into both eyes every morning.    primidone (MYSOLINE) 50 MG tablet Take 50 mg by mouth every morning.     sertraline (ZOLOFT) 100 MG tablet Take 150 mg by mouth at bedtime.    SSD 1 % cream Apply 1 application topically daily as needed (for wound).     topiramate (TOPAMAX) 100 MG tablet Take 100-200 mg by mouth 2 (two) times daily. 100mg  in the AM and 200mg  QHS     Valproate Sodium (DEPAKENE) 250 MG/5ML SOLN solution Take 1,000 mg by mouth at bedtime.      STOP taking these medications     benzonatate (TESSALON) 100 MG capsule      clindamycin (CLEOCIN) 300 MG capsule      diclofenac (VOLTAREN) 75 MG EC tablet      gabapentin (NEURONTIN) 600 MG tablet      ibuprofen (ADVIL,MOTRIN) 800 MG tablet      metFORMIN (GLUCOPHAGE) 1000 MG tablet      potassium chloride (K-DUR) 10 MEQ tablet      niacin 50 MG tablet          Brief H and P: For complete details please refer to admission H and P, but in brief 60 y.o.femalewith diabetes mellitus type 2, dementia, COPD was brought to the ER because of worsening right facial erythema swelling and pain. Patient has been taking oral antibiotics for last 2 days despite which patient's swelling did not improve and was worsening. On exam patient has right facial erythema involving the right maxillary and right periorbital area. CT scan does not show any involvement of the bone or orbits. Patient was admitted for IV antibiotics   Hospital Course:  Right Facial cellulitis-improved - patient was initially placed on IV vancomycin and Zosyn which she has completed. Blood cultures negative to date - She was transitioned to Bactrim, Cipro which was subsequently changed to doxycycline and Keflex due to increase in creatinine - will continue doxycycline only for 5 days  Acute renal insufficiency likely AIN - Possibly from Bactrim, patient also received low-dose Lasix during this  hospitalization for volume overload, was also on vancomycin, Zosyn, ciprofloxacin - Renal ultrasound negative, showed no hydronephrosis - Nephrology was consulted, patient has WBCs in urine, complement levels are normal, uric acid is slightly elevated -  creatinine level is improving from 2.3 yesterday to 2.1 today. Patient is requesting to go home today, cleared by nephrology as creatinine is improving. I have discussed in detail with patient's daughter that she needs to have the renal function checked again with PCP on Monday on 10/2.     Diabetes mellitus type 2, controlled (HCC) - Continue sliding scale insulin, Levemir. Metformin discontinued. Patient  will continue Levemir and Humalog sliding scale insulin. - Hemoglobin A1c 6.2    Normocytic normochromic anemia - Currently H&H stable, follow closely    Dementia - Continue Namenda, Aricept  Abdominal distention - Per patient improving, CT abdomen and pelvis showed stable adrenal nodule otherwise no mass or obstruction.   Day of Discharge BP 109/69 (BP Location: Right Arm)   Pulse 87   Temp 97.6 F (36.4 C) (Oral)   Resp 20   Ht 5\' 2"  (1.575 m)   Wt 108.5 kg (239 lb 3.2 oz)   SpO2 97%   BMI 43.75 kg/m   Physical Exam: General: Alert and awake oriented x3 not in any acute distress. HEENT: anicteric sclera, pupils reactive to light and accommodation CVS: S1-S2 clear no murmur rubs or gallops Chest: clear to auscultation bilaterally, no wheezing rales or rhonchi Abdomen: soft nontender, nondistended, normal bowel sounds Extremities: no cyanosis, clubbing or edema noted bilaterally Neuro: Cranial nerves II-XII intact, no focal neurological deficits   The results of significant diagnostics from this hospitalization (including imaging, microbiology, ancillary and laboratory) are listed below for reference.    LAB RESULTS: Basic Metabolic Panel:  Recent Labs Lab 02/09/16 0235 02/10/16 0645 02/11/16 0445  NA 129*  141 140  K 3.3* 4.6 3.6  CL 98* 111 113*  CO2 20* 21* 19*  GLUCOSE 152* 176* 172*  BUN 19 29* 35*  CREATININE 2.05* 2.31* 2.11*  CALCIUM 8.7* 9.1 8.9  MG 1.5*  --   --   PHOS  --   --  6.0*   Liver Function Tests:  Recent Labs Lab 02/05/16 0346 02/11/16 0445  AST 23  --   ALT 29  --   ALKPHOS 78  --   BILITOT 0.5  --   PROT 6.6  --   ALBUMIN 2.9* 2.8*   No results for input(s): LIPASE, AMYLASE in the last 168 hours. No results for input(s): AMMONIA in the last 168 hours. CBC:  Recent Labs Lab 02/05/16 0346 02/10/16 1402  WBC 7.3 9.2  NEUTROABS  --  6.9  HGB 10.8* 11.1*  HCT 35.1* 35.6*  MCV 91.2 89.7  PLT 237 345   Cardiac Enzymes: No results for input(s): CKTOTAL, CKMB, CKMBINDEX, TROPONINI in the last 168 hours. BNP: Invalid input(s): POCBNP CBG:  Recent Labs Lab 02/11/16 0558 02/11/16 1151  GLUCAP 158* 160*    Significant Diagnostic Studies:  Ct Abdomen Pelvis Wo Contrast  Result Date: 02/04/2016 CLINICAL DATA:  Abdominal distention, constipation, dementia, diabetes mellitus, GERD, former smoker, COPD EXAM: CT ABDOMEN AND PELVIS WITHOUT CONTRAST TECHNIQUE: Multidetector CT imaging of the abdomen and pelvis was performed following the standard protocol without IV contrast. Sagittal and coronal MPR images reconstructed from axial data set. COMPARISON:  11/18/2015 FINDINGS: Lower chest: Lung bases clear Hepatobiliary: Liver and gallbladder normal appearance Pancreas: Partial fatty replacement.  Otherwise normal appearance Spleen: Normal appearance Adrenals/Urinary Tract: LEFT adrenal nodule 17 x 14 mm image 30 previously 16 x 15 mm. RIGHT adrenal nodule 14 x 10 mm unchanged. Kidneys, ureters, and bladder normal appearance. Stomach/Bowel: Normal appendix. Gaseous distention of the ascending colon with gas throughout remainder of colon to rectum as well. Stomach and bowel loops otherwise normal appearance. No definite bowel wall thickening or evidence of  obstruction. Vascular/Lymphatic: Mild scattered atherosclerotic calcifications without aortic aneurysm. No adenopathy. Normal sized periportal and peripancreatic nodes. Reproductive: Unremarkable uterus and adnexa Other: No free air or free fluid.  No hernia. Musculoskeletal: Degenerative disc disease changes thoracolumbar  spine. No acute bone lesions. IMPRESSION: Stable adrenal nodules. Aortic atherosclerosis. Slight gaseous distention of the ascending colon without evidence of bowel obstruction or wall thickening. Remainder of exam unremarkable. Electronically Signed   By: Ulyses SouthwardMark  Boles M.D.   On: 02/04/2016 17:12   Ct Head Wo Contrast  Result Date: 02/03/2016 CLINICAL DATA:  60 year old female with right facial swelling and redness for 3 days. Possible fall. Initial encounter. EXAM: CT HEAD WITHOUT CONTRAST CT MAXILLOFACIAL WITHOUT CONTRAST TECHNIQUE: Multidetector CT imaging of the head and maxillofacial structures were performed using the standard protocol without intravenous contrast. Multiplanar CT image reconstructions of the maxillofacial structures were also generated. COMPARISON:  [facility] noncontrast head CT 09/05/2005 FINDINGS: CT HEAD FINDINGS Brain: Mild generalized cerebral volume loss, but volume remains within normal limits for age. No midline shift, ventriculomegaly, mass effect, evidence of mass lesion, intracranial hemorrhage or evidence of cortically based acute infarction. Gray-white matter differentiation is within normal limits throughout the brain. Minimal to mild for age nonspecific white matter hypodensity. Vascular: No suspicious intracranial vascular hyperdensity. Skull: No acute osseous abnormality identified. Sinuses/Orbits: Visualized paranasal sinuses and mastoids are stable and well pneumatized. Other: No acute orbit or scalp soft tissue findings. CT MAXILLOFACIAL FINDINGS Osseous: Absent dentition. Mandible intact. Maxilla intact. No facial fracture  identified. Advanced degenerative changes in the visible cervical spine including bulky anterior endplate osteophytosis. No acute osseous abnormality identified. Orbits: Negative. Sinuses: Clear. Soft tissues: Negative visualized noncontrast larynx, pharynx, parapharyngeal spaces, retropharyngeal space, sublingual space, submandibular glands and parotid glands. No fluid collection identified in the face or upper neck. Visible cervical and submandibular lymph nodes are normal. Limited intracranial: Reported above. IMPRESSION: 1. No acute intracranial abnormality and largely unremarkable for age noncontrast CT appearance of the brain. 2. No acute osseous abnormality or inflammatory process identified in the face. Absent dentition. Clear paranasal sinuses. 3. Cervical spine diffuse idiopathic skeletal hyperostosis. Electronically Signed   By: Odessa FlemingH  Hall M.D.   On: 02/03/2016 16:13   Ct Maxillofacial Wo Contrast  Result Date: 02/03/2016 CLINICAL DATA:  60 year old female with right facial swelling and redness for 3 days. Possible fall. Initial encounter. EXAM: CT HEAD WITHOUT CONTRAST CT MAXILLOFACIAL WITHOUT CONTRAST TECHNIQUE: Multidetector CT imaging of the head and maxillofacial structures were performed using the standard protocol without intravenous contrast. Multiplanar CT image reconstructions of the maxillofacial structures were also generated. COMPARISON:  [facility] noncontrast head CT 09/05/2005 FINDINGS: CT HEAD FINDINGS Brain: Mild generalized cerebral volume loss, but volume remains within normal limits for age. No midline shift, ventriculomegaly, mass effect, evidence of mass lesion, intracranial hemorrhage or evidence of cortically based acute infarction. Gray-white matter differentiation is within normal limits throughout the brain. Minimal to mild for age nonspecific white matter hypodensity. Vascular: No suspicious intracranial vascular hyperdensity. Skull: No acute osseous  abnormality identified. Sinuses/Orbits: Visualized paranasal sinuses and mastoids are stable and well pneumatized. Other: No acute orbit or scalp soft tissue findings. CT MAXILLOFACIAL FINDINGS Osseous: Absent dentition. Mandible intact. Maxilla intact. No facial fracture identified. Advanced degenerative changes in the visible cervical spine including bulky anterior endplate osteophytosis. No acute osseous abnormality identified. Orbits: Negative. Sinuses: Clear. Soft tissues: Negative visualized noncontrast larynx, pharynx, parapharyngeal spaces, retropharyngeal space, sublingual space, submandibular glands and parotid glands. No fluid collection identified in the face or upper neck. Visible cervical and submandibular lymph nodes are normal. Limited intracranial: Reported above. IMPRESSION: 1. No acute intracranial abnormality and largely unremarkable for age noncontrast CT appearance of the brain.  2. No acute osseous abnormality or inflammatory process identified in the face. Absent dentition. Clear paranasal sinuses. 3. Cervical spine diffuse idiopathic skeletal hyperostosis. Electronically Signed   By: Odessa Fleming M.D.   On: 02/03/2016 16:13    2D ECHO:   Disposition and Follow-up: Discharge Instructions    Diet Carb Modified    Complete by:  As directed    Discharge instructions    Complete by:  As directed    Please have your labs checked on Monday, October 2nd to follow-up on your kidney function   Increase activity slowly    Complete by:  As directed        DISPOSITION: Home   DISCHARGE FOLLOW-UP Follow-up Information    Roxanne Mins, PA-C Follow up on 02/15/2016.   Specialty:  Cardiology Why:  please check the labs for renal/kidney function  Contact information: [facility]  [address] [place] [address] [phone]            Time spent on Discharge: 35 minutes  Signed:   Darsh Vandevoort M.D. [facility] 02/11/2016, 12:10 PM Pager:  [phone]

## 2016-02-11 NOTE — Progress Notes (Signed)
Subjective: Interval History: has complaints wants to go home.  Objective: Vital signs in last 24 hours: Temp:  [97.5 F (36.4 C)-98.8 F (37.1 C)] 98.4 F (36.9 C) (09/28 0602) Pulse Rate:  [72-80] 72 (09/28 0602) Resp:  [18-20] 20 (09/28 0602) BP: (128-162)/(64-80) 128/80 (09/28 0602) SpO2:  [94 %-100 %] 94 % (09/28 0929) Weight change:   Intake/Output from previous day: 09/27 0701 - 09/28 0700 In: 240 [P.O.:240] Out: 3100 [Urine:3100] Intake/Output this shift: No intake/output data recorded.  General appearance: cooperative, morbidly obese, pale and slowed mentation Resp: diminished breath sounds bilaterally Cardio: S1, S2 normal GI: obese,pos bs, soft Extremities: edema 1+ Skin: urticaria on legs  Lab Results:  Recent Labs  02/10/16 1402  WBC 9.2  HGB 11.1*  HCT 35.6*  PLT 345   BMET:  Recent Labs  02/10/16 0645 02/11/16 0445  NA 141 140  K 4.6 3.6  CL 111 113*  CO2 21* 19*  GLUCOSE 176* 172*  BUN 29* 35*  CREATININE 2.31* 2.11*  CALCIUM 9.1 8.9   No results for input(s): PTH in the last 72 hours. Iron Studies: No results for input(s): IRON, TIBC, TRANSFERRIN, FERRITIN in the last 72 hours.  Studies/Results: No results found.  I have reviewed the patient's current medications.  Assessment/Plan: 1 AKI  Improving.  Suspect Vanc vs AIN with Zosyn or Cipro. Mild acidemia. WBC in urine,  Suspect AIN 2 obesity 3 Facial cellulitis  Resolving, would only use Doxy 4 DM 5 Bipolar P only Doxy, culture urine, needs F/U labs on Mon if D/C    LOS: 8 days   Govani Radloff L 02/11/2016,9:49 AM

## 2016-02-11 NOTE — Care Management Note (Signed)
Case Management Note  Patient Details  Name: Stephanie Rice MRN: [id number] Date of Birth: 09-02-1955  Subjective/Objective:                    Action/Plan: Pt discharging home with her daughter. CM met with the patient and she states she has 24 hour assistance at home. Daughter to provide transportation.   Expected Discharge Date:                  Expected Discharge Plan:  Home/Self Care  In-House Referral:     Discharge planning Services     Post Acute Care Choice:    Choice offered to:     DME Arranged:    DME Agency:     HH Arranged:    [place] Agency:     Status of Service:  Completed, signed off  If discussed at [employer] of Stay Meetings, dates discussed:    Additional Comments:  Pollie Friar, RN 02/11/2016, 2:25 PM

## 2016-02-11 NOTE — Progress Notes (Signed)
Patient's daughter called to let her know her that the patient is being discharged. Let her message for her to call writer back.

## 2016-02-12 LAB — URINE CULTURE

## 2020-06-30 ENCOUNTER — Encounter (HOSPITAL_COMMUNITY): Payer: Self-pay | Admitting: Emergency Medicine

## 2020-06-30 ENCOUNTER — Emergency Department (HOSPITAL_COMMUNITY): Payer: Medicare Other

## 2020-06-30 ENCOUNTER — Inpatient Hospital Stay (HOSPITAL_COMMUNITY)
Admission: EM | Admit: 2020-06-30 | Discharge: 2020-08-06 | DRG: 190 | Disposition: A | Discharge status: Home / Self Care | Payer: Medicare Other | Attending: Internal Medicine | Admitting: Internal Medicine

## 2020-06-30 ENCOUNTER — Other Ambulatory Visit: Payer: Self-pay

## 2020-06-30 DIAGNOSIS — E11649 Type 2 diabetes mellitus with hypoglycemia without coma: Secondary | ICD-10-CM | POA: Diagnosis present

## 2020-06-30 DIAGNOSIS — F79 Unspecified intellectual disabilities: Secondary | ICD-10-CM | POA: Diagnosis present

## 2020-06-30 DIAGNOSIS — R5383 Other fatigue: Secondary | ICD-10-CM | POA: Diagnosis not present

## 2020-06-30 DIAGNOSIS — G9341 Metabolic encephalopathy: Secondary | ICD-10-CM | POA: Diagnosis present

## 2020-06-30 DIAGNOSIS — J9622 Acute and chronic respiratory failure with hypercapnia: Secondary | ICD-10-CM | POA: Diagnosis present

## 2020-06-30 DIAGNOSIS — J9621 Acute and chronic respiratory failure with hypoxia: Secondary | ICD-10-CM | POA: Diagnosis present

## 2020-06-30 DIAGNOSIS — F319 Bipolar disorder, unspecified: Secondary | ICD-10-CM | POA: Diagnosis present

## 2020-06-30 DIAGNOSIS — F0391 Unspecified dementia with behavioral disturbance: Secondary | ICD-10-CM | POA: Diagnosis not present

## 2020-06-30 DIAGNOSIS — Z6841 Body Mass Index (BMI) 40.0 and over, adult: Secondary | ICD-10-CM | POA: Diagnosis not present

## 2020-06-30 DIAGNOSIS — E1140 Type 2 diabetes mellitus with diabetic neuropathy, unspecified: Secondary | ICD-10-CM | POA: Diagnosis present

## 2020-06-30 DIAGNOSIS — J189 Pneumonia, unspecified organism: Secondary | ICD-10-CM | POA: Diagnosis present

## 2020-06-30 DIAGNOSIS — Z20822 Contact with and (suspected) exposure to covid-19: Secondary | ICD-10-CM | POA: Diagnosis present

## 2020-06-30 DIAGNOSIS — F0390 Unspecified dementia without behavioral disturbance: Secondary | ICD-10-CM | POA: Diagnosis present

## 2020-06-30 DIAGNOSIS — L89312 Pressure ulcer of right buttock, stage 2: Secondary | ICD-10-CM | POA: Diagnosis not present

## 2020-06-30 DIAGNOSIS — E119 Type 2 diabetes mellitus without complications: Secondary | ICD-10-CM

## 2020-06-30 DIAGNOSIS — J449 Chronic obstructive pulmonary disease, unspecified: Secondary | ICD-10-CM | POA: Diagnosis present

## 2020-06-30 DIAGNOSIS — J9811 Atelectasis: Secondary | ICD-10-CM | POA: Diagnosis not present

## 2020-06-30 DIAGNOSIS — L8990 Pressure ulcer of unspecified site, unspecified stage: Secondary | ICD-10-CM | POA: Insufficient documentation

## 2020-06-30 DIAGNOSIS — R0600 Dyspnea, unspecified: Secondary | ICD-10-CM

## 2020-06-30 DIAGNOSIS — Z86718 Personal history of other venous thrombosis and embolism: Secondary | ICD-10-CM | POA: Diagnosis not present

## 2020-06-30 DIAGNOSIS — K219 Gastro-esophageal reflux disease without esophagitis: Secondary | ICD-10-CM | POA: Diagnosis present

## 2020-06-30 DIAGNOSIS — R627 Adult failure to thrive: Secondary | ICD-10-CM | POA: Diagnosis not present

## 2020-06-30 DIAGNOSIS — N390 Urinary tract infection, site not specified: Secondary | ICD-10-CM | POA: Diagnosis present

## 2020-06-30 DIAGNOSIS — Z794 Long term (current) use of insulin: Secondary | ICD-10-CM

## 2020-06-30 DIAGNOSIS — J441 Chronic obstructive pulmonary disease with (acute) exacerbation: Secondary | ICD-10-CM | POA: Diagnosis present

## 2020-06-30 DIAGNOSIS — R0902 Hypoxemia: Secondary | ICD-10-CM

## 2020-06-30 DIAGNOSIS — R1312 Dysphagia, oropharyngeal phase: Secondary | ICD-10-CM | POA: Diagnosis present

## 2020-06-30 DIAGNOSIS — Z87891 Personal history of nicotine dependence: Secondary | ICD-10-CM

## 2020-06-30 DIAGNOSIS — R338 Other retention of urine: Secondary | ICD-10-CM

## 2020-06-30 DIAGNOSIS — Z833 Family history of diabetes mellitus: Secondary | ICD-10-CM

## 2020-06-30 DIAGNOSIS — Z66 Do not resuscitate: Secondary | ICD-10-CM | POA: Diagnosis present

## 2020-06-30 DIAGNOSIS — D696 Thrombocytopenia, unspecified: Secondary | ICD-10-CM | POA: Diagnosis present

## 2020-06-30 DIAGNOSIS — I4891 Unspecified atrial fibrillation: Secondary | ICD-10-CM

## 2020-06-30 DIAGNOSIS — E1165 Type 2 diabetes mellitus with hyperglycemia: Secondary | ICD-10-CM | POA: Diagnosis present

## 2020-06-30 DIAGNOSIS — M7989 Other specified soft tissue disorders: Secondary | ICD-10-CM | POA: Diagnosis not present

## 2020-06-30 DIAGNOSIS — I10 Essential (primary) hypertension: Secondary | ICD-10-CM | POA: Diagnosis present

## 2020-06-30 DIAGNOSIS — Z888 Allergy status to other drugs, medicaments and biological substances status: Secondary | ICD-10-CM

## 2020-06-30 DIAGNOSIS — G309 Alzheimer's disease, unspecified: Secondary | ICD-10-CM

## 2020-06-30 DIAGNOSIS — E875 Hyperkalemia: Secondary | ICD-10-CM | POA: Diagnosis present

## 2020-06-30 DIAGNOSIS — F0281 Dementia in other diseases classified elsewhere with behavioral disturbance: Secondary | ICD-10-CM | POA: Diagnosis not present

## 2020-06-30 DIAGNOSIS — I480 Paroxysmal atrial fibrillation: Secondary | ICD-10-CM | POA: Diagnosis not present

## 2020-06-30 DIAGNOSIS — F3160 Bipolar disorder, current episode mixed, unspecified: Secondary | ICD-10-CM | POA: Diagnosis not present

## 2020-06-30 DIAGNOSIS — Z7901 Long term (current) use of anticoagulants: Secondary | ICD-10-CM | POA: Diagnosis not present

## 2020-06-30 DIAGNOSIS — E160 Drug-induced hypoglycemia without coma: Secondary | ICD-10-CM | POA: Diagnosis not present

## 2020-06-30 DIAGNOSIS — I482 Chronic atrial fibrillation, unspecified: Secondary | ICD-10-CM | POA: Diagnosis present

## 2020-06-30 DIAGNOSIS — E876 Hypokalemia: Secondary | ICD-10-CM | POA: Diagnosis present

## 2020-06-30 DIAGNOSIS — Z6838 Body mass index (BMI) 38.0-38.9, adult: Secondary | ICD-10-CM

## 2020-06-30 DIAGNOSIS — I5040 Unspecified combined systolic (congestive) and diastolic (congestive) heart failure: Secondary | ICD-10-CM | POA: Diagnosis not present

## 2020-06-30 DIAGNOSIS — Z79899 Other long term (current) drug therapy: Secondary | ICD-10-CM | POA: Diagnosis not present

## 2020-06-30 DIAGNOSIS — G20 Parkinson's disease: Secondary | ICD-10-CM | POA: Diagnosis not present

## 2020-06-30 DIAGNOSIS — J440 Chronic obstructive pulmonary disease with acute lower respiratory infection: Secondary | ICD-10-CM | POA: Diagnosis present

## 2020-06-30 DIAGNOSIS — Z515 Encounter for palliative care: Secondary | ICD-10-CM | POA: Diagnosis not present

## 2020-06-30 DIAGNOSIS — D649 Anemia, unspecified: Secondary | ICD-10-CM | POA: Diagnosis present

## 2020-06-30 DIAGNOSIS — F0280 Dementia in other diseases classified elsewhere without behavioral disturbance: Secondary | ICD-10-CM | POA: Diagnosis not present

## 2020-06-30 DIAGNOSIS — T383X5A Adverse effect of insulin and oral hypoglycemic [antidiabetic] drugs, initial encounter: Secondary | ICD-10-CM | POA: Diagnosis not present

## 2020-06-30 LAB — COMPREHENSIVE METABOLIC PANEL
ALT: 14 U/L (ref 0–44)
AST: 15 U/L (ref 15–41)
Albumin: 2.4 g/dL — ABNORMAL LOW (ref 3.5–5.0)
Alkaline Phosphatase: 64 U/L (ref 38–126)
Anion gap: 9 (ref 5–15)
BUN: 34 mg/dL — ABNORMAL HIGH (ref 8–23)
CO2: 36 mmol/L — ABNORMAL HIGH (ref 22–32)
Calcium: 7.9 mg/dL — ABNORMAL LOW (ref 8.9–10.3)
Chloride: 93 mmol/L — ABNORMAL LOW (ref 98–111)
Creatinine, Ser: 0.79 mg/dL (ref 0.44–1.00)
GFR, Estimated: >60 mL/min (ref >60)
Glucose, Bld: 131 mg/dL — ABNORMAL HIGH (ref 70–99)
Potassium: 5.4 mmol/L — ABNORMAL HIGH (ref 3.5–5.1)
Sodium: 138 mmol/L (ref 135–145)
Total Bilirubin: 0.5 mg/dL (ref 0.3–1.2)
Total Protein: 6 g/dL — ABNORMAL LOW (ref 6.5–8.1)

## 2020-06-30 LAB — I-STAT ARTERIAL BLOOD GAS, ED
Acid-Base Excess: 11 mmol/L — ABNORMAL HIGH (ref 0.0–2.0)
Bicarbonate: 42.2 mmol/L — ABNORMAL HIGH (ref 20.0–28.0)
Calcium, Ion: 1.13 mmol/L — ABNORMAL LOW (ref 1.15–1.40)
HCT: 39 % (ref 36.0–46.0)
Hemoglobin: 13.3 g/dL (ref 12.0–15.0)
O2 Saturation: 43 %
Patient temperature: 97.4
Potassium: 5.4 mmol/L — ABNORMAL HIGH (ref 3.5–5.1)
Sodium: 138 mmol/L (ref 135–145)
TCO2: 45 mmol/L — ABNORMAL HIGH (ref 22–32)
pCO2 arterial: 96 mmHg (ref 32.0–48.0)
pH, Arterial: 7.248 — ABNORMAL LOW (ref 7.350–7.450)
pO2, Arterial: 29 mmHg — CL (ref 83.0–108.0)

## 2020-06-30 LAB — CBC WITH DIFFERENTIAL/PLATELET
Abs Immature Granulocytes: 0.09 10*3/uL — ABNORMAL HIGH (ref 0.00–0.07)
Basophils Absolute: 0 10*3/uL (ref 0.0–0.1)
Basophils Relative: 1 %
Eosinophils Absolute: 0 10*3/uL (ref 0.0–0.5)
Eosinophils Relative: 1 %
HCT: 43.1 % (ref 36.0–46.0)
Hemoglobin: 12 g/dL (ref 12.0–15.0)
Immature Granulocytes: 2 %
Lymphocytes Relative: 38 %
Lymphs Abs: 1.5 10*3/uL (ref 0.7–4.0)
MCH: 29.5 pg (ref 26.0–34.0)
MCHC: 27.8 g/dL — ABNORMAL LOW (ref 30.0–36.0)
MCV: 105.9 fL — ABNORMAL HIGH (ref 80.0–100.0)
Monocytes Absolute: 0.6 10*3/uL (ref 0.1–1.0)
Monocytes Relative: 14 %
Neutro Abs: 1.8 10*3/uL (ref 1.7–7.7)
Neutrophils Relative %: 44 %
Platelets: 209 10*3/uL (ref 150–400)
RBC: 4.07 MIL/uL (ref 3.87–5.11)
RDW: 15.1 % (ref 11.5–15.5)
WBC: 4 10*3/uL (ref 4.0–10.5)
nRBC: 0.5 % — ABNORMAL HIGH (ref 0.0–0.2)

## 2020-06-30 LAB — HIV ANTIBODY (ROUTINE TESTING W REFLEX): HIV Screen 4th Generation wRfx: NONREACTIVE

## 2020-06-30 LAB — URINALYSIS, ROUTINE W REFLEX MICROSCOPIC
Bilirubin Urine: NEGATIVE
Glucose, UA: NEGATIVE mg/dL
Hgb urine dipstick: NEGATIVE
Ketones, ur: 5 mg/dL — AB
Nitrite: POSITIVE — AB
Protein, ur: 100 mg/dL — AB
Specific Gravity, Urine: 1.023 (ref 1.005–1.030)
WBC, UA: >50 WBC/hpf — ABNORMAL HIGH (ref 0–5)
pH: 5 (ref 5.0–8.0)

## 2020-06-30 LAB — GLUCOSE, CAPILLARY: Glucose-Capillary: 193 mg/dL — ABNORMAL HIGH (ref 70–99)

## 2020-06-30 LAB — LACTIC ACID, PLASMA: Lactic Acid, Venous: 1.3 mmol/L (ref 0.5–1.9)

## 2020-06-30 LAB — TROPONIN I (HIGH SENSITIVITY): Troponin I (High Sensitivity): 15 ng/L (ref <18)

## 2020-06-30 LAB — PROCALCITONIN: Procalcitonin: <0.1 ng/mL

## 2020-06-30 LAB — RESP PANEL BY RT-PCR (FLU A&B, COVID) ARPGX2
Influenza A by PCR: NEGATIVE
Influenza B by PCR: NEGATIVE
SARS Coronavirus 2 by RT PCR: NEGATIVE

## 2020-06-30 LAB — LIPASE, BLOOD: Lipase: 19 U/L (ref 11–51)

## 2020-06-30 LAB — CBG MONITORING, ED: Glucose-Capillary: 133 mg/dL — ABNORMAL HIGH (ref 70–99)

## 2020-06-30 MED ORDER — INSULIN ASPART 100 UNIT/ML ~~LOC~~ SOLN
0.0000 [IU] | Freq: Every day | SUBCUTANEOUS | Status: DC
  Administered 2020-07-03 – 2020-07-04 (×2): 3 [IU] via SUBCUTANEOUS
  Administered 2020-07-05 – 2020-07-21 (×4): 2 [IU] via SUBCUTANEOUS
  Administered 2020-07-29: 4 [IU] via SUBCUTANEOUS

## 2020-06-30 MED ORDER — PRIMIDONE 50 MG PO TABS
50.0000 mg | ORAL_TABLET | Freq: Every morning | ORAL | Status: DC
  Administered 2020-07-02: 50 mg via ORAL
  Filled 2020-06-30: qty 1

## 2020-06-30 MED ORDER — METHYLPREDNISOLONE SODIUM SUCC 125 MG IJ SOLR
125.0000 mg | Freq: Once | INTRAMUSCULAR | Status: AC
  Administered 2020-06-30: 125 mg via INTRAVENOUS
  Filled 2020-06-30: qty 2

## 2020-06-30 MED ORDER — SODIUM CHLORIDE 0.9 % IV SOLN
3.0000 g | Freq: Four times a day (QID) | INTRAVENOUS | Status: DC
  Administered 2020-06-30: 3 g via INTRAVENOUS
  Filled 2020-06-30 (×3): qty 8

## 2020-06-30 MED ORDER — CYCLOBENZAPRINE HCL 10 MG PO TABS: 5.0000 mg | ORAL_TABLET | Freq: Three times a day (TID) | ORAL | Status: DC | PRN

## 2020-06-30 MED ORDER — INSULIN DETEMIR 100 UNIT/ML ~~LOC~~ SOLN
20.0000 [IU] | Freq: Every day | SUBCUTANEOUS | Status: DC
  Administered 2020-07-01 – 2020-07-04 (×4): 20 [IU] via SUBCUTANEOUS
  Filled 2020-06-30 (×4): qty 0.2

## 2020-06-30 MED ORDER — APIXABAN 5 MG PO TABS
5.0000 mg | ORAL_TABLET | Freq: Two times a day (BID) | ORAL | Status: DC
  Administered 2020-07-02 – 2020-08-06 (×70): 5 mg via ORAL
  Filled 2020-06-30 (×72): qty 1

## 2020-06-30 MED ORDER — INSULIN ASPART 100 UNIT/ML ~~LOC~~ SOLN
0.0000 [IU] | Freq: Three times a day (TID) | SUBCUTANEOUS | Status: DC
  Administered 2020-06-30: 2 [IU] via SUBCUTANEOUS
  Administered 2020-07-01 – 2020-07-02 (×5): 5 [IU] via SUBCUTANEOUS
  Administered 2020-07-02 – 2020-07-03 (×2): 3 [IU] via SUBCUTANEOUS
  Administered 2020-07-03 (×2): 5 [IU] via SUBCUTANEOUS
  Administered 2020-07-04: 3 [IU] via SUBCUTANEOUS

## 2020-06-30 MED ORDER — CLONAZEPAM 0.25 MG PO TBDP
0.2500 mg | ORAL_TABLET | Freq: Three times a day (TID) | ORAL | Status: DC
  Administered 2020-07-02: 0.25 mg via ORAL
  Filled 2020-06-30: qty 1

## 2020-06-30 MED ORDER — DOXYCYCLINE HYCLATE 100 MG PO TABS
100.0000 mg | ORAL_TABLET | Freq: Two times a day (BID) | ORAL | Status: AC
  Administered 2020-07-02 – 2020-07-05 (×7): 100 mg via ORAL
  Filled 2020-06-30 (×7): qty 1

## 2020-06-30 MED ORDER — MONTELUKAST SODIUM 10 MG PO TABS: 10.0000 mg | ORAL_TABLET | Freq: Every day | ORAL | Status: DC

## 2020-06-30 MED ORDER — SODIUM CHLORIDE 0.9 % IV BOLUS
500.0000 mL | Freq: Once | INTRAVENOUS | Status: AC
  Administered 2020-06-30: 500 mL via INTRAVENOUS

## 2020-06-30 MED ORDER — HYDRALAZINE HCL 25 MG PO TABS
25.0000 mg | ORAL_TABLET | Freq: Four times a day (QID) | ORAL | Status: DC | PRN
  Administered 2020-07-03 – 2020-07-05 (×4): 25 mg via ORAL
  Filled 2020-06-30 (×4): qty 1

## 2020-06-30 MED ORDER — ALBUTEROL SULFATE HFA 108 (90 BASE) MCG/ACT IN AERS: 2.0000 | INHALATION_SPRAY | Freq: Four times a day (QID) | RESPIRATORY_TRACT | Status: DC | PRN

## 2020-06-30 MED ORDER — MAGNESIUM SULFATE 2 GM/50ML IV SOLN
2.0000 g | Freq: Once | INTRAVENOUS | Status: AC
  Administered 2020-06-30: 2 g via INTRAVENOUS
  Filled 2020-06-30: qty 50

## 2020-06-30 MED ORDER — SODIUM ZIRCONIUM CYCLOSILICATE 10 G PO PACK: 10.0000 g | PACK | Freq: Once | ORAL | Status: DC

## 2020-06-30 MED ORDER — POLYVINYL ALCOHOL 1.4 % OP SOLN
1.0000 [drp] | Freq: Every day | OPHTHALMIC | Status: DC
  Administered 2020-07-01 – 2020-08-06 (×34): 1 [drp] via OPHTHALMIC
  Filled 2020-06-30 (×2): qty 15

## 2020-06-30 MED ORDER — MELATONIN 5 MG PO TABS: 20.0000 mg | ORAL_TABLET | Freq: Every day | ORAL | Status: DC

## 2020-06-30 MED ORDER — METHYLPREDNISOLONE SODIUM SUCC 40 MG IJ SOLR
40.0000 mg | Freq: Four times a day (QID) | INTRAMUSCULAR | Status: AC
Start: 2020-07-01 — End: 2020-07-01
  Administered 2020-07-01 (×4): 40 mg via INTRAVENOUS
  Filled 2020-06-30 (×4): qty 1

## 2020-06-30 MED ORDER — PANTOPRAZOLE SODIUM 40 MG PO TBEC
40.0000 mg | DELAYED_RELEASE_TABLET | Freq: Every day | ORAL | Status: DC
  Administered 2020-07-02 – 2020-08-06 (×34): 40 mg via ORAL
  Filled 2020-06-30 (×16): qty 1
  Filled 2020-06-30: qty 2
  Filled 2020-06-30: qty 1
  Filled 2020-06-30: qty 2
  Filled 2020-06-30 (×17): qty 1

## 2020-06-30 MED ORDER — TOPIRAMATE 25 MG PO TABS: 100.0000 mg | ORAL_TABLET | Freq: Two times a day (BID) | ORAL | Status: DC

## 2020-06-30 MED ORDER — SODIUM CHLORIDE 0.9 % IV SOLN
2.0000 g | Freq: Every day | INTRAVENOUS | Status: DC
  Administered 2020-06-30 – 2020-07-02 (×3): 2 g via INTRAVENOUS
  Filled 2020-06-30: qty 2
  Filled 2020-06-30: qty 20
  Filled 2020-06-30 (×2): qty 2

## 2020-06-30 MED ORDER — PREDNISONE 20 MG PO TABS
40.0000 mg | ORAL_TABLET | Freq: Every day | ORAL | Status: DC
  Administered 2020-07-02 – 2020-07-04 (×3): 40 mg via ORAL
  Filled 2020-06-30 (×3): qty 2

## 2020-06-30 MED ORDER — ATORVASTATIN CALCIUM 10 MG PO TABS
20.0000 mg | ORAL_TABLET | Freq: Every day | ORAL | Status: DC
  Administered 2020-07-02 – 2020-08-06 (×35): 20 mg via ORAL
  Filled 2020-06-30 (×37): qty 2

## 2020-06-30 MED ORDER — ALBUTEROL SULFATE (2.5 MG/3ML) 0.083% IN NEBU: 2.5000 mg | INHALATION_SOLUTION | RESPIRATORY_TRACT | Status: DC | PRN

## 2020-06-30 MED ORDER — FLUTICASONE FUROATE-VILANTEROL 100-25 MCG/INH IN AEPB: 1.0000 | INHALATION_SPRAY | Freq: Every day | RESPIRATORY_TRACT | Status: DC

## 2020-06-30 MED ORDER — VALPROIC ACID 250 MG/5ML PO SOLN
1000.0000 mg | Freq: Every day | ORAL | Status: DC
Start: 2020-06-30 — End: 2020-07-02
  Filled 2020-06-30 (×3): qty 20

## 2020-06-30 MED ORDER — GABAPENTIN 100 MG PO CAPS
100.0000 mg | ORAL_CAPSULE | Freq: Three times a day (TID) | ORAL | Status: DC
  Administered 2020-07-02: 100 mg via ORAL
  Filled 2020-06-30: qty 1

## 2020-06-30 MED ORDER — LOXAPINE SUCCINATE 5 MG PO CAPS
20.0000 mg | ORAL_CAPSULE | Freq: Every day | ORAL | Status: DC
  Administered 2020-07-02 – 2020-08-05 (×34): 20 mg via ORAL
  Filled 2020-06-30 (×41): qty 4

## 2020-06-30 NOTE — H&P (Signed)
History and Physical    Stephanie Rice YBO:[id number] DOB: [birth year] DOA: 06/30/2020  PCP: [name], PA-C (Confirm with patient/family/NH records and if not entered, this has to be entered at [facility] point of entry) Patient coming from: SNF/[place] [facility] Rice  I have personally briefly reviewed patient's old medical records in [facility]  Chief Complaint: AMS  HPI: Stephanie Rice is a 65 y.o. female with medical history significant of chronic hypoxic and hypercapnic respiratory failure secondary COPD, on Trelegy PRN +2 L PRN, HTN, recent diagnosed DVT, morbid obesity, IDDM, Dementia, bipolar disorder, presented with altered mentation.   Patient was recently admitted to [facility] health at [facility] for small bowel obstruction for which she underwent exploratory laparotomy. Her hospitalization was complicated with hypoxia and hypercapnia respiratory failure intubated, and she developed right lower lobe pneumonia patient was treated with Zosyn, and she also developed a DVT and Eliquis was started during admission. In addition, she was evaluated by palliative care team during same admission and made DNR. She was also evaluated by speech therapist and consider be safe to continue p.o. diet.  Today, patient's family visited patient and nursing home and found patient confused and sent to ED. EMS found her blood pressure to be on the low side at 500 ml bolus was given and blood pressure improved.    ED Course: CT head negative, blood gas showed 7.2 4/96/29, potassium 5.4, bicarb 36 creatinine 0.7.  Review of Systems: Unable to perform, patient in significant respiratory distress  Past Medical History:  Diagnosis Date  . Arthritis   . Barrett esophagus   . Bipolar 1 disorder (HCC)   . COPD (chronic obstructive pulmonary disease) (HCC)   . Dementia (HCC)   . Diabetes mellitus without complication (HCC)   . GERD (gastroesophageal reflux disease)     Past Surgical History:  Procedure  Laterality Date  . BREAST CYST EXCISION    . BREAST SURGERY       reports that she has quit smoking. She has never used smokeless tobacco. She reports that she does not drink alcohol and does not use drugs.  Allergies  Allergen Reactions  . Ciprofloxacin Hives, Itching and Rash  . Macrobid [Nitrofurantoin] Hives, Itching and Rash  . Dextromethorphan-Quinidine Other (See Comments)  . Ziprasidone Hcl Other (See Comments)    Family History  Problem Relation Age of Onset  . Dementia Other   . Diabetes Other     Prior to Admission medications   Medication Sig Start Date End Date Taking? Authorizing Provider  albuterol (PROVENTIL HFA;VENTOLIN HFA) 108 (90 Base) MCG/ACT inhaler Inhale 2 puffs into the lungs every 6 (six) hours as needed for wheezing or shortness of breath.    [provider]  albuterol (PROVENTIL) (2.5 MG/3ML) 0.083% nebulizer solution Take 2.5 mg by nebulization every 4 (four) hours as needed for wheezing or shortness of breath.    [provider]  atorvastatin (LIPITOR) 20 MG tablet Take 20 mg by mouth at bedtime. 01/12/16   [provider]  benztropine (COGENTIN) 0.5 MG tablet Take 0.5 mg by mouth every morning.    [provider]  Carboxymethylcellulose Sodium 1 % GEL Place 1 drop into both eyes at bedtime.    [provider]  clonazePAM (KLONOPIN) 0.5 MG tablet Take 0.5 mg by mouth 3 (three) times daily.     [provider]  cyclobenzaprine (FLEXERIL) 5 MG tablet Take 5 mg by mouth 3 (three) times daily as needed for muscle spasms.  [provider]  doxycycline (VIBRA-TABS) 100 MG tablet Take 1 tablet (100 mg total) by mouth 2 (two) times daily. X 5 days 02/11/16   Rai, Ripudeep K, MD  ferrous sulfate 325 (65 FE) MG EC tablet Take 325 mg by mouth daily with breakfast.     [provider]  fluticasone furoate-vilanterol (BREO ELLIPTA) 100-25 MCG/INH AEPB Inhale 1 puff into the lungs daily.     [provider]  gabapentin (NEURONTIN) 300 MG capsule Take 1 capsule (300 mg total) by mouth 3 (three) times daily. 02/11/16   Rai, [name], MD  hydrocortisone 1 % ointment Apply topically 2 (two) times daily. Apply to the rash twice a day until resolved 02/11/16   Rai, Ripudeep K, MD  insulin detemir (LEVEMIR) 100 UNIT/ML injection Inject 20 Units into the skin daily.     [provider]  insulin lispro (HUMALOG) 100 UNIT/ML injection Inject 0-0.09 mLs (0-9 Units total) into the skin 3 (three) times daily before meals. Sliding scale CBG 70 - 120: 0 units CBG 121 - 150: 1 unit,  CBG 151 - 200: 2 units,  CBG 201 - 250: 3 units,  CBG 251 - 300: 5 units,  CBG 301 - 350: 7 units,  CBG 351 - 400: 9 units   CBG > 400: 9 units and notify your MD  Can substitute vial with flex pen if cheaper 02/11/16   Rai, Ripudeep K, MD  lidocaine (XYLOCAINE) 5 % ointment Apply 1 application topically daily as needed for irritation.  10/02/15   [provider]  loxapine (LOXITANE) 10 MG capsule Take 20 mg by mouth at bedtime.     [provider]  Melatonin 10 MG TABS Take 20 mg by mouth at bedtime.    [provider]  Memantine HCl-Donepezil HCl (NAMZARIC) 28-10 MG CP24 Take 1 capsule by mouth at bedtime.     [provider]  metoCLOPramide (REGLAN) 10 MG tablet Take 10 mg by mouth 4 (four) times daily.    [provider]  montelukast (SINGULAIR) 10 MG tablet Take 10 mg by mouth at bedtime.    [provider]  Multiple Vitamins-Minerals (CENTRAVITES 50 PLUS) TABS Take 1 tablet by mouth daily.    [provider]  niacin (NIASPAN) 500 MG CR tablet Take 500 mg by mouth every morning. 11/26/15   [provider]  omeprazole (PRILOSEC) 40 MG capsule Take 40 mg by mouth 2 (two) times daily.    [provider]  OXYGEN Inhale 2 L into the lungs continuous.     [provider]  polyvinyl alcohol (LIQUIFILM TEARS) 1.4 %  ophthalmic solution Place 1 drop into both eyes every morning.    [provider]  primidone (MYSOLINE) 50 MG tablet Take 50 mg by mouth every morning.     [provider]  sertraline (ZOLOFT) 100 MG tablet Take 150 mg by mouth at bedtime.    [provider]  SSD 1 % cream Apply 1 application topically daily as needed (for wound).  01/07/16   [provider]  topiramate (TOPAMAX) 100 MG tablet Take 100-200 mg by mouth 2 (two) times daily. 100mg  in the AM and 200mg  QHS     [provider]  Valproate Sodium (DEPAKENE) 250 MG/5ML SOLN solution Take 1,000 mg by mouth at bedtime. 11/23/15 11/22/16  [provider]    Physical Exam: Vitals:   06/30/20 1430 06/30/20 1445 06/30/20 1530 06/30/20 1600  BP: 136/73  Pulse: 81 85 85 86  Resp: 17 16 17 18   Temp:      TempSrc:      SpO2: 97% 96% 95% 95%    Constitutional: NAD, calm, comfortable Vitals:   06/30/20 1430 06/30/20 1445 06/30/20 1530 06/30/20 1600  BP: 136/73     Pulse: 81 85 85 86  Resp: 17 16 17 18   Temp:      TempSrc:      SpO2: 97% 96% 95% 95%   Eyes: PERRL, lids and conjunctivae normal ENMT: Mucous membranes are moist. Posterior pharynx clear of any exudate or lesions.Normal dentition.  Neck: normal, supple, no masses, no thyromegaly Respiratory: Diminished breathing sound bilaterally, diffused wheezing, no crackles. Increasing respiratory effort. No accessory muscle use.  Cardiovascular: Regular rate and rhythm, no murmurs / rubs / gallops. 1+ extremity edema. 2+ pedal pulses. No carotid bruits.  Abdomen: no tenderness, no masses palpated. No hepatosplenomegaly. Bowel sounds positive.  Musculoskeletal: no clubbing / cyanosis. No joint deformity upper and lower extremities. Good ROM, no contractures. Normal muscle tone.  Skin: no rashes, lesions, ulcers. No induration Neurologic: No facial droop, moving all limbs, following simple commands psychiatric: Somewhat confused  about time and place    Labs on Admission: I have personally reviewed following labs and imaging studies  CBC: Recent Labs  Lab 06/30/20 1226 06/30/20 1516  WBC 4.0  --   NEUTROABS 1.8  --   HGB 12.0 13.3  HCT 43.1 39.0  MCV 105.9*  --   PLT 209  --    Basic Metabolic Panel: Recent Labs  Lab 06/30/20 1226 06/30/20 1516  NA 138 138  K 5.4* 5.4*  CL 93*  --   CO2 36*  --   GLUCOSE 131*  --   BUN 34*  --   CREATININE 0.79  --   CALCIUM 7.9*  --    GFR: CrCl cannot be calculated (Unknown ideal weight.). Liver Function Tests: Recent Labs  Lab 06/30/20 1226  AST 15  ALT 14  ALKPHOS 64  BILITOT 0.5  PROT 6.0*  ALBUMIN 2.4*   Recent Labs  Lab 06/30/20 1226  LIPASE 19   No results for input(s): AMMONIA in the last 168 hours. Coagulation Profile: No results for input(s): INR, PROTIME in the last 168 hours. Cardiac Enzymes: No results for input(s): CKTOTAL, CKMB, CKMBINDEX, TROPONINI in the last 168 hours. BNP (last 3 results) No results for input(s): PROBNP in the last 8760 hours. HbA1C: No results for input(s): HGBA1C in the last 72 hours. CBG: No results for input(s): GLUCAP in the last 168 hours. Lipid Profile: No results for input(s): CHOL, HDL, LDLCALC, TRIG, CHOLHDL, LDLDIRECT in the last 72 hours. Thyroid Function Tests: No results for input(s): TSH, T4TOTAL, FREET4, T3FREE, THYROIDAB in the last 72 hours. Anemia Panel: No results for input(s): VITAMINB12, FOLATE, FERRITIN, TIBC, IRON, RETICCTPCT in the last 72 hours. Urine analysis:    Component Value Date/Time   COLORURINE YELLOW 06/30/2020 1211   APPEARANCEUR TURBID (A) 06/30/2020 1211   LABSPEC 1.023 06/30/2020 1211   PHURINE 5.0 06/30/2020 1211   GLUCOSEU NEGATIVE 06/30/2020 1211   HGBUR NEGATIVE 06/30/2020 1211   BILIRUBINUR NEGATIVE 06/30/2020 1211   KETONESUR 5 (A) 06/30/2020 1211   PROTEINUR 100 (A) 06/30/2020 1211   NITRITE POSITIVE (A) 06/30/2020 1211   LEUKOCYTESUR LARGE (A)  06/30/2020 1211    Radiological Exams on Admission: CT Head Wo Contrast  Result Date: 06/30/2020 CLINICAL DATA:  Altered mental status. EXAM: CT  HEAD WITHOUT CONTRAST TECHNIQUE: Contiguous axial images were obtained from the base of the skull through the vertex without intravenous contrast. COMPARISON:  CT head dated 02/03/2016. FINDINGS: Brain: No evidence of acute infarction, hemorrhage, hydrocephalus, extra-axial collection or mass lesion/mass effect. There is mild cerebral volume loss with associated ex vacuo dilatation. Periventricular white matter hypoattenuation likely represents chronic small vessel ischemic disease. Vascular: No hyperdense vessel or unexpected calcification. Skull: Normal. Negative for fracture or focal lesion. Sinuses/Orbits: Bilateral mastoid effusions, right greater than left, are noted. The paranasal sinuses are clear. The orbits appear normal. Other: None. IMPRESSION: 1. No acute intracranial process. 2. Bilateral mastoid effusions. Electronically Signed   By: Romona Curls M.D.   On: 06/30/2020 13:21    EKG: Independently reviewed. Normal sinus rhythm, no acute ST changes  Assessment/Plan Active Problems:   COPD with acute exacerbation (HCC)   COPD (chronic obstructive pulmonary disease) (HCC)  (please populate well all problems here in Problem List. (For example, if patient is on BP meds at home and you resume or decide to hold them, it is a problem that needs to be her. Same for CAD, COPD, HLD and so on)  Acute metabolic encephalopathy -likely secondary to acute on chronic hypoxic and hypercapnic respiratory failure -CT head reassuring  Acute COPD exacerbation with combined hypoxia and hypercapnia -continue BiPAP support -patient is DNR -IV Solu-Medrol, doxycycline and breathing meds -avoid sedation medications, cut down gabapentin, discontinue clonidine.  Mild hyperkalemia -lowkelma, then reevaluate  HTN -discontinue clonidine for mentation -as  needed hydralazine  IDDM -continue Lantus 20 units at bedtime -sliding scale  Diabetic neuropathy -cut down, gabapentin  Bipolar disorder -cut down doxepin -continue valproic acid  Morbid obesity -outpatient PCP follow-up  Recent DVT -continue Eliquis  DVT prophylaxis: Eliquis  code Status: DNR Family Communication: Daughter Disposition Plan: Expect more than 2 midnight hospital stay to treat COPD exacerbation and wean down BiPAP. Consults called: None Admission status: PCU   Emeline General MD [facility] Pager [phone]  06/30/2020, 4:16 PM

## 2020-06-30 NOTE — Progress Notes (Signed)
Patient arrived to room 2w13 from ED.  Assessment complete, VS obtained, and Admission database began.

## 2020-06-30 NOTE — ED Triage Notes (Signed)
Pt arrives to ED from [facility] with c/o altered mental status and hypotension. Family came to visit pt today and realized that pt was confused over her baseline. EMS arrived on scene to find pt hypotensive, unable to palpate a BP until 300cc NS given.

## 2020-06-30 NOTE — Progress Notes (Signed)
Patient transported to 2W13 without complications.

## 2020-06-30 NOTE — Progress Notes (Addendum)
Procalcitonin low, D/C Unasyn and keep Doxy for COPD.  D/W daughter pt chronic dysphagia on pureed diet, with strict instruction of sitting up 30 min before feed.  Daughter reported pt not been able to use the Trilogy since discharge from [place] in the facility, which is not compatible to use Trilogy.  UA appears to be UTI, add Rocephin.

## 2020-06-30 NOTE — Progress Notes (Signed)
Chest xray reviewed.  Right sided infiltrate considered to be residue imaging changes from a PNA pt had last month, vs a new infection? Will expand coverage for aspiration and send a baseline Procalcitonin for today and tomorrow.  Speech evaluation.

## 2020-06-30 NOTE — ED Notes (Signed)
[phone] daghter/poa Joyce Gross

## 2020-06-30 NOTE — ED Provider Notes (Signed)
[facility] EMERGENCY DEPARTMENT Provider Note   CSN: [id number] Arrival date & time: 06/30/20  1149     History Chief Complaint  Patient presents with  . Altered Mental Status    Stephanie Rice is a 65 y.o. female.  Patient is a 65 year old female with past medical history of COPD, diabetes, dementia, GERD, COPD, bipolar 1.  Patient brought from her extended care facility for evaluation of altered mental status.  Family came to visit today and noted that she seemed more confused and wanted her evaluated.  Her blood pressure was checked in the field and was found to be low.  Patient given normal saline with some improvement in the blood pressure.  Patient denies to me that she has any specific complaints, but does seem somewhat disoriented.  When patient's daughter/power of attorney arrived, she has informed me that her mother was recently admitted at [facility] and spent over a month there.  She had a very long and protracted hospital course relating to some sort of intestinal issue requiring surgery.  Patient was discharged from the hospital, then to the rehab facility that sent her here.  Daughter also reports she has had episodes of CO2 retention.  The history is provided by the patient.  Altered Mental Status Presenting symptoms: confusion and disorientation   Severity:  Moderate Timing:  Constant Progression:  Worsening Context: dementia        Past Medical History:  Diagnosis Date  . Arthritis   . Barrett esophagus   . Bipolar 1 disorder (HCC)   . COPD (chronic obstructive pulmonary disease) (HCC)   . Dementia (HCC)   . Diabetes mellitus without complication (HCC)   . GERD (gastroesophageal reflux disease)     Patient Active Problem List   Diagnosis Date Noted  . Abdominal distention   . Facial cellulitis 02/04/2016  . Diabetes mellitus type 2, controlled (HCC) 02/04/2016  . Normocytic normochromic anemia 02/04/2016  . Dementia (HCC)  02/04/2016  . Bipolar I disorder (HCC) 02/04/2016  . Cellulitis 02/03/2016    Past Surgical History:  Procedure Laterality Date  . BREAST CYST EXCISION    . BREAST SURGERY       OB History   No obstetric history on file.     Family History  Problem Relation Age of Onset  . Dementia Other   . Diabetes Other     Social History   Tobacco Use  . Smoking status: Former Games developer  . Smokeless tobacco: Never Used  Substance Use Topics  . Alcohol use: No  . Drug use: No    Home Medications Prior to Admission medications   Medication Sig Start Date End Date Taking? Authorizing Provider  albuterol (PROVENTIL HFA;VENTOLIN HFA) 108 (90 Base) MCG/ACT inhaler Inhale 2 puffs into the lungs every 6 (six) hours as needed for wheezing or shortness of breath.    [provider]  albuterol (PROVENTIL) (2.5 MG/3ML) 0.083% nebulizer solution Take 2.5 mg by nebulization every 4 (four) hours as needed for wheezing or shortness of breath.    [provider]  atorvastatin (LIPITOR) 20 MG tablet Take 20 mg by mouth at bedtime. 01/12/16   [provider]  benztropine (COGENTIN) 0.5 MG tablet Take 0.5 mg by mouth every morning.    [provider]  Carboxymethylcellulose Sodium 1 % GEL Place 1 drop into both eyes at bedtime.    [provider]  clonazePAM (KLONOPIN) 0.5 MG tablet Take 0.5 mg by mouth 3 (three)  times daily.     [provider]  cyclobenzaprine (FLEXERIL) 5 MG tablet Take 5 mg by mouth 3 (three) times daily as needed for muscle spasms.    [provider]  doxycycline (VIBRA-TABS) 100 MG tablet Take 1 tablet (100 mg total) by mouth 2 (two) times daily. X 5 days 02/11/16   Rai, Ripudeep K, MD  ferrous sulfate 325 (65 FE) MG EC tablet Take 325 mg by mouth daily with breakfast.     [provider]  fluticasone furoate-vilanterol (BREO ELLIPTA) 100-25 MCG/INH AEPB Inhale 1 puff into the lungs daily.    [provider]   gabapentin (NEURONTIN) 300 MG capsule Take 1 capsule (300 mg total) by mouth 3 (three) times daily. 02/11/16   Rai, Ripudeep Kirtland BouchardK, MD  hydrocortisone 1 % ointment Apply topically 2 (two) times daily. Apply to the rash twice a day until resolved 02/11/16   Rai, Ripudeep K, MD  insulin detemir (LEVEMIR) 100 UNIT/ML injection Inject 20 Units into the skin daily.     [provider]  insulin lispro (HUMALOG) 100 UNIT/ML injection Inject 0-0.09 mLs (0-9 Units total) into the skin 3 (three) times daily before meals. Sliding scale CBG 70 - 120: 0 units CBG 121 - 150: 1 unit,  CBG 151 - 200: 2 units,  CBG 201 - 250: 3 units,  CBG 251 - 300: 5 units,  CBG 301 - 350: 7 units,  CBG 351 - 400: 9 units   CBG > 400: 9 units and notify your MD  Can substitute vial with flex pen if cheaper 02/11/16   Rai, Ripudeep K, MD  lidocaine (XYLOCAINE) 5 % ointment Apply 1 application topically daily as needed for irritation.  10/02/15   [provider]  loxapine (LOXITANE) 10 MG capsule Take 20 mg by mouth at bedtime.     [provider]  Melatonin 10 MG TABS Take 20 mg by mouth at bedtime.    [provider]  Memantine HCl-Donepezil HCl (NAMZARIC) 28-10 MG CP24 Take 1 capsule by mouth at bedtime.     [provider]  metoCLOPramide (REGLAN) 10 MG tablet Take 10 mg by mouth 4 (four) times daily.    [provider]  montelukast (SINGULAIR) 10 MG tablet Take 10 mg by mouth at bedtime.    [provider]  Multiple Vitamins-Minerals (CENTRAVITES 50 PLUS) TABS Take 1 tablet by mouth daily.    [provider]  niacin (NIASPAN) 500 MG CR tablet Take 500 mg by mouth every morning. 11/26/15   [provider]  omeprazole (PRILOSEC) 40 MG capsule Take 40 mg by mouth 2 (two) times daily.    [provider]  OXYGEN Inhale 2 L into the lungs continuous.     [provider]  polyvinyl alcohol (LIQUIFILM TEARS) 1.4 % ophthalmic solution Place 1 drop  into both eyes every morning.    [provider]  primidone (MYSOLINE) 50 MG tablet Take 50 mg by mouth every morning.     [provider]  sertraline (ZOLOFT) 100 MG tablet Take 150 mg by mouth at bedtime.    [provider]  SSD 1 % cream Apply 1 application topically daily as needed (for wound).  01/07/16   [provider]  topiramate (TOPAMAX) 100 MG tablet Take 100-200 mg by mouth 2 (two) times daily. 100mg  in the AM and 200mg  QHS     [provider]  Valproate Sodium (DEPAKENE) 250 MG/5ML SOLN solution Take  1,000 mg by mouth at bedtime. 11/23/15 11/22/16  [provider]    Allergies    Ciprofloxacin, Macrobid [nitrofurantoin], Dextromethorphan-quinidine, and Ziprasidone hcl  Review of Systems   Review of Systems  Psychiatric/Behavioral: Positive for confusion.  All other systems reviewed and are negative.   Physical Exam Updated Vital Signs BP (!) 85/65   Pulse 87   Temp (!) 97.4 F (36.3 C) (Oral)   Resp 18   SpO2 92%   Physical Exam Vitals and nursing note reviewed.  Constitutional:      General: She is not in acute distress.    Appearance: She is well-developed and well-nourished. She is not diaphoretic.  HENT:     Head: Normocephalic and atraumatic.     Mouth/Throat:     Mouth: Mucous membranes are dry.     Pharynx: No oropharyngeal exudate or posterior oropharyngeal erythema.  Cardiovascular:     Rate and Rhythm: Normal rate and regular rhythm.     Heart sounds: No murmur heard. No friction rub. No gallop.   Pulmonary:     Effort: Pulmonary effort is normal. No respiratory distress.     Breath sounds: Normal breath sounds. No wheezing.  Abdominal:     General: Bowel sounds are normal. There is no distension.     Palpations: Abdomen is soft.     Tenderness: There is no abdominal tenderness.  Musculoskeletal:        General: No swelling or tenderness. Normal range of motion.     Cervical back: Normal range  of motion and neck supple.     Right lower leg: No edema.     Left lower leg: No edema.  Skin:    General: Skin is warm and dry.  Neurological:     Mental Status: She is alert.     Comments: Patient is awake and alert.  She responds to questions and commands, but does appear somewhat disoriented.  She does not appear to comprehend where she is or why she is here.     ED Results / Procedures / Treatments   Labs (all labs ordered are listed, but only abnormal results are displayed) Labs Reviewed  CBC WITH DIFFERENTIAL/PLATELET  COMPREHENSIVE METABOLIC PANEL  URINALYSIS, ROUTINE W REFLEX MICROSCOPIC  LACTIC ACID, PLASMA  LACTIC ACID, PLASMA  LIPASE, BLOOD  TROPONIN I (HIGH SENSITIVITY)    EKG EKG Interpretation  Date/Time:  Tuesday June 30 2020 12:00:29 EST Ventricular Rate:  88 PR Interval:    QRS Duration: 82 QT Interval:  333 QTC Calculation: 403 R Axis:   84 Text Interpretation: Sinus rhythm Borderline right axis deviation Low voltage, precordial leads No significant change since 02/03/2016 Confirmed by Geoffery Lyons ([id number]) on 06/30/2020 12:18:26 PM   Radiology No results found.  Procedures Procedures   Medications Ordered in ED Medications  sodium chloride 0.9 % bolus 500 mL (has no administration in time range)    ED Course  I have reviewed the triage vital signs and the nursing notes.  Pertinent labs & imaging results that were available during my care of the patient were reviewed by me and considered in my medical decision making (see chart for details).    MDM Rules/Calculators/A&P  Patient presenting here for altered mental status, the details of which are described in the HPI.  Patient's work-up thus far is essentially unremarkable, but her blood gas does appear to show a respiratory acidosis with CO2 retention.  This may well be the cause of her altered mental  status.  It is also possible that this sample was venous, but the respiratory therapist  feels as though it was pulsatile.  Patient will be placed on BiPAP.  I feel as though she will require admission based on her overall clinical picture.  She does have a urinary tract infection which will be treated with antibiotics.  I spoke with Dr. Chipper Herb who agrees to admit.  Per the daughter, patient is a DNR/DNI.  CRITICAL CARE Performed by: Geoffery Lyons Total critical care time: 45 minutes Critical care time was exclusive of separately billable procedures and treating other patients. Critical care was necessary to treat or prevent imminent or life-threatening deterioration. Critical care was time spent personally by me on the following activities: development of treatment plan with patient and/or surrogate as well as nursing, discussions with consultants, evaluation of patient's response to treatment, examination of patient, obtaining history from patient or surrogate, ordering and performing treatments and interventions, ordering and review of laboratory studies, ordering and review of radiographic studies, pulse oximetry and re-evaluation of patient's condition.   Final Clinical Impression(s) / ED Diagnoses Final diagnoses:  None    Rx / DC Orders ED Discharge Orders    None       Geoffery Lyons, MD 07/01/20 2335

## 2020-06-30 NOTE — Progress Notes (Signed)
Pharmacy Antibiotic Note  Stephanie Rice is a 65 y.o. female admitted on 06/30/2020 with aspiration pneumonia.  Pharmacy has been consulted for Unasyn dosing. WBC wnl, hypothermic. Scr 0.79, recent baseline unknown.   Plan: Unasyn 3 gm IV q6 hrs Monitor renal function, clinical progression, LOT     Temp (24hrs), Avg:97.4 F (36.3 C), Min:97.4 F (36.3 C), Max:97.4 F (36.3 C)  Recent Labs  Lab 06/30/20 1226  WBC 4.0  CREATININE 0.79  LATICACIDVEN 1.3    CrCl cannot be calculated (Unknown ideal weight.).    Allergies  Allergen Reactions  . Ciprofloxacin Hives, Itching and Rash  . Macrobid [Nitrofurantoin] Hives, Itching and Rash  . Dextromethorphan-Quinidine Other (See Comments)  . Ziprasidone Hcl Other (See Comments)    Antimicrobials this admission: Unasyn 2/15 >>   Dose adjustments this admission: N/A  Microbiology results: N/A  Tama Headings, PharmD, BCPS PGY2 Cardiology Pharmacy Resident Phone: [phone] 06/30/2020  5:07 PM  Please check [email or web address] for unit-specific pharmacy phone numbers.

## 2020-07-01 ENCOUNTER — Inpatient Hospital Stay (HOSPITAL_COMMUNITY): Payer: Medicare Other

## 2020-07-01 DIAGNOSIS — J9621 Acute and chronic respiratory failure with hypoxia: Secondary | ICD-10-CM | POA: Diagnosis not present

## 2020-07-01 DIAGNOSIS — I5040 Unspecified combined systolic (congestive) and diastolic (congestive) heart failure: Secondary | ICD-10-CM

## 2020-07-01 DIAGNOSIS — N390 Urinary tract infection, site not specified: Secondary | ICD-10-CM | POA: Diagnosis not present

## 2020-07-01 DIAGNOSIS — J9622 Acute and chronic respiratory failure with hypercapnia: Secondary | ICD-10-CM

## 2020-07-01 DIAGNOSIS — J441 Chronic obstructive pulmonary disease with (acute) exacerbation: Secondary | ICD-10-CM | POA: Diagnosis not present

## 2020-07-01 DIAGNOSIS — G9341 Metabolic encephalopathy: Secondary | ICD-10-CM | POA: Diagnosis not present

## 2020-07-01 LAB — HEMOGLOBIN A1C
Hgb A1c MFr Bld: 7.2 % — ABNORMAL HIGH (ref 4.8–5.6)
Mean Plasma Glucose: 160 mg/dL

## 2020-07-01 LAB — CBC
HCT: 40.4 % (ref 36.0–46.0)
Hemoglobin: 12.2 g/dL (ref 12.0–15.0)
MCH: 30.7 pg (ref 26.0–34.0)
MCHC: 30.2 g/dL (ref 30.0–36.0)
MCV: 101.8 fL — ABNORMAL HIGH (ref 80.0–100.0)
Platelets: 207 10*3/uL (ref 150–400)
RBC: 3.97 MIL/uL (ref 3.87–5.11)
RDW: 14.5 % (ref 11.5–15.5)
WBC: 3.3 10*3/uL — ABNORMAL LOW (ref 4.0–10.5)
nRBC: 0 % (ref 0.0–0.2)

## 2020-07-01 LAB — BLOOD GAS, VENOUS
Acid-Base Excess: 10.1 mmol/L — ABNORMAL HIGH (ref 0.0–2.0)
Bicarbonate: 35.9 mmol/L — ABNORMAL HIGH (ref 20.0–28.0)
Drawn by: 1406
FIO2: 52
O2 Saturation: 96.3 %
Patient temperature: 37
pCO2, Ven: 67.7 mmHg — ABNORMAL HIGH (ref 44.0–60.0)
pH, Ven: 7.345 (ref 7.250–7.430)
pO2, Ven: 83.6 mmHg — ABNORMAL HIGH (ref 32.0–45.0)

## 2020-07-01 LAB — GLUCOSE, CAPILLARY
Glucose-Capillary: 230 mg/dL — ABNORMAL HIGH (ref 70–99)
Glucose-Capillary: 225 mg/dL — ABNORMAL HIGH (ref 70–99)
Glucose-Capillary: 202 mg/dL — ABNORMAL HIGH (ref 70–99)
Glucose-Capillary: 183 mg/dL — ABNORMAL HIGH (ref 70–99)

## 2020-07-01 LAB — ECHOCARDIOGRAM COMPLETE
Area-P 1/2: 4.46 cm2
Calc EF: 65.2 %
S' Lateral: 2.2 cm
Single Plane A2C EF: 60.3 %
Single Plane A4C EF: 74.3 %

## 2020-07-01 LAB — BASIC METABOLIC PANEL
Anion gap: 11 (ref 5–15)
BUN: 22 mg/dL (ref 8–23)
CO2: 34 mmol/L — ABNORMAL HIGH (ref 22–32)
Calcium: 8.4 mg/dL — ABNORMAL LOW (ref 8.9–10.3)
Chloride: 98 mmol/L (ref 98–111)
Creatinine, Ser: 0.59 mg/dL (ref 0.44–1.00)
GFR, Estimated: >60 mL/min (ref >60)
Glucose, Bld: 254 mg/dL — ABNORMAL HIGH (ref 70–99)
Potassium: 5.6 mmol/L — ABNORMAL HIGH (ref 3.5–5.1)
Sodium: 143 mmol/L (ref 135–145)

## 2020-07-01 LAB — PROCALCITONIN: Procalcitonin: <0.1 ng/mL

## 2020-07-01 MED ORDER — FLUTICASONE FUROATE-VILANTEROL 100-25 MCG/INH IN AEPB
1.0000 | INHALATION_SPRAY | Freq: Every day | RESPIRATORY_TRACT | Status: DC
  Administered 2020-07-03 – 2020-08-05 (×26): 1 via RESPIRATORY_TRACT
  Filled 2020-07-01 (×2): qty 28

## 2020-07-01 MED ORDER — TOPIRAMATE 25 MG PO TABS: 200.0000 mg | ORAL_TABLET | Freq: Every day | ORAL | Status: DC

## 2020-07-01 MED ORDER — TOPIRAMATE 25 MG PO TABS
100.0000 mg | ORAL_TABLET | Freq: Every day | ORAL | Status: DC
  Administered 2020-07-02: 100 mg via ORAL
  Filled 2020-07-01: qty 4

## 2020-07-01 MED ORDER — HALOPERIDOL LACTATE 5 MG/ML IJ SOLN
0.5000 mg | Freq: Four times a day (QID) | INTRAMUSCULAR | Status: DC | PRN
  Administered 2020-07-01 – 2020-07-06 (×6): 0.5 mg via INTRAVENOUS
  Filled 2020-07-01 (×6): qty 1

## 2020-07-01 MED ORDER — SODIUM ZIRCONIUM CYCLOSILICATE 10 G PO PACK: 10.0000 g | PACK | Freq: Three times a day (TID) | ORAL | Status: AC

## 2020-07-01 NOTE — Progress Notes (Signed)
[facility] PROGRESS NOTE   Stephanie Rice [id number]:[id number] DOB: 05-Dec-1955 DOA: 06/30/2020  PCP: [name], PA-C  Brief History/Interval Summary: 65 y.o. female with medical history significant of chronic hypoxic and hypercapnic respiratory failure secondary COPD, on Trelegy PRN +2 L PRN, HTN, recent diagnosed DVT, morbid obesity, IDDM, Dementia, bipolar disorder, presented with altered mentation.  Patient was found to have hypercapnic respiratory failure.  Was hospitalized for further management.   Consultants: None  Procedures: None yet  Antibiotics: Anti-infectives (From admission, onward)   Start     Dose/Rate Route Frequency Ordered Stop   06/30/20 2200  doxycycline (VIBRA-TABS) tablet 100 mg        100 mg Oral Every 12 hours 06/30/20 1612 07/05/20 2159   06/30/20 2030  cefTRIAXone (ROCEPHIN) 2 g in sodium chloride 0.9 % 100 mL IVPB        2 g 200 mL/hr over 30 Minutes Intravenous Daily at bedtime 06/30/20 2006     06/30/20 1800  Ampicillin-Sulbactam (UNASYN) 3 g in sodium chloride 0.9 % 100 mL IVPB  Status:  Discontinued        3 g 200 mL/hr over 30 Minutes Intravenous Every 6 hours 06/30/20 1703 06/30/20 1954      Subjective/Interval History: Patient noted to be somewhat distracted.  However she was able to tell me that she was in [facility].  Denies any chest pain.  No nausea or vomiting.      Assessment/Plan:  Acute metabolic encephalopathy Likely secondary to hypercapnia along with the urinary tract infection.  No focal deficits noted.  CT head was reassuring.  Mentation appears to be improving.  Continue to monitor.  PT and OT evaluation.  Aspiration precautions.  Speech therapy to see.  Acute COPD exacerbation with acute on chronic hypoxic and hypercapnic respiratory failure/community-acquired pneumonia Patient was placed on BiPAP.  Mentation has improved.  Respiratory status appears to be stable.  She started pulling on her BiPAP earlier  this morning and was switched over to high flow.  Continue steroids, nebulizing treatments as well as doxycycline.  ABG shows improvement in pH.  Influenza and COVID-19 test were negative.  Urinary tract infection Follow-up cultures.  Continue ceftriaxone.  Mild hyperkalemia Labs are pending from this morning.  Essential hypertension As needed hydralazine.  Monitor blood pressures.  Clonidine was discontinued.  Diabetes mellitus type 2, on insulin, uncontrolled with hyperglycemia Lantus being continued.  Continue SSI.  Diabetic neuropathy Noted to be on gabapentin.  History of bipolar disorder Dose of Doxepin reduced.  Valproic acid being continued.  History of DVT Continue Eliquis.  Morbid obesity Estimated body mass index is 43.75 kg/m as calculated from the following:   Height as of 02/04/16: 5\' 2"  (1.575 m).   Weight as of 02/04/16: 108.5 kg.    DVT Prophylaxis: On Eliquis Code Status: DNR Family Communication: No family at bedside Disposition Plan: Hopefully return home when improved  Status is: Inpatient  Remains inpatient appropriate because:Altered mental status, IV treatments appropriate due to intensity of illness or inability to take PO and Inpatient level of care appropriate due to severity of illness   Dispo: The patient is from: SNF              Anticipated d/c is to: SNF              Anticipated d/c date is: 3 days              Patient currently is not  medically stable to d/c.   Difficult to place patient No      Medications:  Scheduled: . apixaban  5 mg Oral BID  . atorvastatin  20 mg Oral QHS  . clonazePAM  0.25 mg Oral TID  . doxycycline  100 mg Oral Q12H  . fluticasone furoate-vilanterol  1 puff Inhalation Daily  . gabapentin  100 mg Oral TID  . insulin aspart  0-15 Units Subcutaneous TID WC  . insulin aspart  0-5 Units Subcutaneous QHS  . insulin detemir  20 Units Subcutaneous Daily  . loxapine  20 mg Oral QHS  . Melatonin  20 mg Oral  QHS  . methylPREDNISolone (SOLU-MEDROL) injection  40 mg Intravenous Q6H   Followed by  . [START ON 07/02/2020] predniSONE  40 mg Oral Q breakfast  . montelukast  10 mg Oral QHS  . pantoprazole  40 mg Oral Daily  . polyvinyl alcohol  1 drop Both Eyes Q breakfast  . primidone  50 mg Oral q morning  . sodium zirconium cyclosilicate  10 g Oral Once  . topiramate  100 mg Oral Daily   And  . topiramate  200 mg Oral Daily  . Valproate Sodium  1,000 mg Oral QHS   Continuous: . cefTRIAXone (ROCEPHIN)  IV 2 g (06/30/20 2255)   CVE:LFYBOFBPZ, cyclobenzaprine, hydrALAZINE   Objective:  Vital Signs  Vitals:   07/01/20 0300 07/01/20 0400 07/01/20 0607 07/01/20 0749  BP: (!) 157/91 (!) 157/91  (!) 141/52  Pulse:  90  98  Resp:  15  20  Temp: 98.7 F (37.1 C)   98.5 F (36.9 C)  TempSrc: Axillary   Oral  SpO2:  100% 93% 100%    Intake/Output Summary (Last 24 hours) at 07/01/2020 1025 Last data filed at 07/01/2020 0500 Gross per 24 hour  Intake 739.8 ml  Output --  Net 739.8 ml   There were no vitals filed for this visit.  General appearance: Awake alert.  In no distress.  Mildly distracted Resp: Diminished air entry at the bases.  Mild wheezing.  No rhonchi.   Cardio: S1-S2 is normal regular.  No S3-S4.  No rubs murmurs or bruit GI: Abdomen is soft.  Nontender nondistended.  Bowel sounds are present normal.  No masses organomegaly Extremities: No edema.  Full range of motion of lower extremities. Neurologic:No focal neurological deficits.    Lab Results:  Data Reviewed: I have personally reviewed following labs and imaging studies  CBC: Recent Labs  Lab 06/30/20 1226 06/30/20 1516  WBC 4.0  --   NEUTROABS 1.8  --   HGB 12.0 13.3  HCT 43.1 39.0  MCV 105.9*  --   PLT 209  --     Basic Metabolic Panel: Recent Labs  Lab 06/30/20 1226 06/30/20 1516  NA 138 138  K 5.4* 5.4*  CL 93*  --   CO2 36*  --   GLUCOSE 131*  --   BUN 34*  --   CREATININE 0.79  --    CALCIUM 7.9*  --     GFR: CrCl cannot be calculated (Unknown ideal weight.).  Liver Function Tests: Recent Labs  Lab 06/30/20 1226  AST 15  ALT 14  ALKPHOS 64  BILITOT 0.5  PROT 6.0*  ALBUMIN 2.4*    Recent Labs  Lab 06/30/20 1226  LIPASE 19    HbA1C: Recent Labs    06/30/20 1614  HGBA1C 7.2*    CBG: Recent Labs  Lab 06/30/20 1708  06/30/20 2353 07/01/20 0746  GLUCAP 133* 193* 230*      Recent Results (from the past 240 hour(s))  Resp Panel by RT-PCR (Flu A&B, Covid) Nasopharyngeal Swab     Status: None   Collection Time: 06/30/20  3:57 PM   Specimen: Nasopharyngeal Swab; Nasopharyngeal(NP) swabs in vial transport medium  Result Value Ref Range Status   SARS Coronavirus 2 by RT PCR NEGATIVE NEGATIVE Final    Comment: (NOTE) SARS-CoV-2 target nucleic acids are NOT DETECTED.  The SARS-CoV-2 RNA is generally detectable in upper respiratory specimens during the acute phase of infection. The lowest concentration of SARS-CoV-2 viral copies this assay can detect is 138 copies/mL. A negative result does not preclude SARS-Cov-2 infection and should not be used as the sole basis for treatment or other patient management decisions. A negative result may occur with  improper specimen collection/handling, submission of specimen other than nasopharyngeal swab, presence of viral mutation(s) within the areas targeted by this assay, and inadequate number of viral copies(<138 copies/mL). A negative result must be combined with clinical observations, patient history, and epidemiological information. The expected result is Negative.  Fact Sheet for Patients:  BloggerCourse.comhttps://www.fda.gov/media/152166/download  Fact Sheet for Healthcare Providers:  SeriousBroker.ithttps://www.fda.gov/media/152162/download  This test is no t yet approved or cleared by the [place] FDA and  has been authorized for detection and/or diagnosis of SARS-CoV-2 by FDA under an Emergency Use Authorization  (EUA). This EUA will remain  in effect (meaning this test can be used) for the duration of the COVID-19 declaration under Section 564(b)(1) of the Act, 21 U.S.C.section 360bbb-3(b)(1), unless the authorization is terminated  or revoked sooner.       Influenza A by PCR NEGATIVE NEGATIVE Final   Influenza B by PCR NEGATIVE NEGATIVE Final    Comment: (NOTE) The Xpert Xpress SARS-CoV-2/FLU/RSV plus assay is intended as an aid in the diagnosis of influenza from Nasopharyngeal swab specimens and should not be used as a sole basis for treatment. Nasal washings and aspirates are unacceptable for Xpert Xpress SARS-CoV-2/FLU/RSV testing.  Fact Sheet for Patients: BloggerCourse.comhttps://www.fda.gov/media/152166/download  Fact Sheet for Healthcare Providers: SeriousBroker.ithttps://www.fda.gov/media/152162/download  This test is not yet approved or cleared by the [place] FDA and has been authorized for detection and/or diagnosis of SARS-CoV-2 by FDA under an Emergency Use Authorization (EUA). This EUA will remain in effect (meaning this test can be used) for the duration of the COVID-19 declaration under Section 564(b)(1) of the Act, 21 U.S.C. section 360bbb-3(b)(1), unless the authorization is terminated or revoked.  Performed at [facility], [address] [place], [place] [address]       Radiology Studies: DG Chest 1 View  Result Date: 06/30/2020 CLINICAL DATA:  Altered level of consciousness, hypotension EXAM: CHEST  1 VIEW COMPARISON:  06/13/2016 FINDINGS: Single frontal view of the chest demonstrates an enlarged cardiac silhouette. There is right middle lobe consolidation compatible with airspace disease. Pleural effusion left lateral costophrenic angle. Diffuse interstitial prominence compatible with chronic scarring. There is increased central vascular congestion. No pneumothorax. IMPRESSION: 1. Right middle lobe consolidation most compatible with pneumonia. 2. Small left pleural effusion. 3.  Central vascular congestion. Electronically Signed   By: Sharlet SalinaMichael  Brown M.D.   On: 06/30/2020 16:30   CT Head Wo Contrast  Result Date: 06/30/2020 CLINICAL DATA:  Altered mental status. EXAM: CT HEAD WITHOUT CONTRAST TECHNIQUE: Contiguous axial images were obtained from the base of the skull through the vertex without intravenous contrast. COMPARISON:  CT head dated 02/03/2016.  FINDINGS: Brain: No evidence of acute infarction, hemorrhage, hydrocephalus, extra-axial collection or mass lesion/mass effect. There is mild cerebral volume loss with associated ex vacuo dilatation. Periventricular white matter hypoattenuation likely represents chronic small vessel ischemic disease. Vascular: No hyperdense vessel or unexpected calcification. Skull: Normal. Negative for fracture or focal lesion. Sinuses/Orbits: Bilateral mastoid effusions, right greater than left, are noted. The paranasal sinuses are clear. The orbits appear normal. Other: None. IMPRESSION: 1. No acute intracranial process. 2. Bilateral mastoid effusions. Electronically Signed   By: Romona Curls M.D.   On: 06/30/2020 13:21       LOS: 1 day   Stephanie Rice  [facility] Pager on [email or web address]  07/01/2020, 10:25 AM

## 2020-07-01 NOTE — Plan of Care (Signed)

## 2020-07-01 NOTE — Progress Notes (Signed)
  Echocardiogram 2D Echocardiogram has been performed.  Stephanie Rice 07/01/2020, 12:35 PM

## 2020-07-01 NOTE — Progress Notes (Signed)
SLP Cancellation Note  Patient Details Name: Stephanie Rice MRN: [id number] DOB: 1955-07-08   Cancelled treatment:       Reason Eval/Treat Not Completed: Medical issues which prohibited therapy (Pt was scheduled for modified barium swallow study at 1330 but pt required biPAP due to SpO2 in the 70s and it was therefore cancelled for today. SLP will follow up on subsequent date.)  Mariea Mcmartin I. Vear Clock, MS, CCC-SLP Acute Rehabilitation Services Office number [phone] Pager [phone]  [name] 07/01/2020, 2:40 PM

## 2020-07-01 NOTE — Progress Notes (Signed)
Initial Nutrition Assessment  DOCUMENTATION CODES:   Not applicable  INTERVENTION:   Once diet advanced:   Ensure Enlive po BID, each supplement provides 350 kcal and 20 grams of protein  MVI daily   If patient unable to liberalize from BiPAP in the next 24-48 hours recommend placement of Cortrak with initiation of enteral nutrition   NUTRITION DIAGNOSIS:   Increased nutrient needs related to acute illness as evidenced by estimated needs.  GOAL:   Patient will meet greater than or equal to 90% of their needs  MONITOR:   PO intake,Diet advancement,Supplement acceptance,Labs,Weight trends,I & O's  REASON FOR ASSESSMENT:   Consult Assessment of nutrition requirement/status  ASSESSMENT:   Patient with PMH significant for chronic hypoxic and hypercapnic respiratory failure 2/2 to COPD, HTN, recent diagnosis of DVT, DM, dementia, and bipolar disorder. Presents this admission with COPD exacerbation.  Unable to obtain nutrition history from patient as she is currently requiring BiPAP. Was schedule for MBS at 1330 today but was unable to participate given breathing difficulty. RD to provide supplementation once diet is advanced. If unable to advance recommend Cortrak placement for short term nutrition support.   Weight history limited. No admission weight obtained at this time. Will need weight to assess for weight loss.   Medications: SS novolog, levemir, solumedrol, lokelma Labs: K 5.6 (H) CBG 133-230  Diet Order:   Diet Order    None      EDUCATION NEEDS:   Not appropriate for education at this time  Skin:  Skin Assessment: Reviewed RN Assessment  Last BM:  PTA  Height:   Ht Readings from Last 1 Encounters:  02/04/16 5\' 2"  (1.575 m)    Weight:   Wt Readings from Last 1 Encounters:  02/04/16 108.5 kg    BMI:  There is no height or weight on file to calculate BMI.  Estimated Nutritional Needs:   Kcal:  1800-2000 kcal  Protein:  90-105  grams  Fluid:  >/= 1.8 L/day  02/06/16 RD, LDN Clinical Nutrition Pager listed in AMION

## 2020-07-01 NOTE — Progress Notes (Signed)
Patient pulling at bipap and IV. Mitts placed on patient hands for safety.

## 2020-07-01 NOTE — TOC Initial Note (Signed)
Transition of Care [facility]) - Initial/Assessment Note    Patient Details  Name: Stephanie Rice MRN: [id number] Date of Birth: 04-Oct-1955  [facility] CM/SW Contact:    [name], LCSW Phone Number: 07/01/2020, 3:06 PM  Clinical Narrative:  Pt unable to participate in assessment, CSW spoke with pt daughter Joyce Gross. Pt lives with Joyce Gross and Kay's roommate but has been hospitalized since Christmas Eve and was then sent to [facility] 2 weeks ago.  Joyce Gross said that her preferred discharge plan is LTAC due to wanting pt to be placed back on the trilogy that she was using prior to her admission on Christmas Eve.  Joyce Gross states pt has alzheimers and dementia, but her cognition was much better when she was on the trilogy and has been worse since they stopped using it at the hospital and at the SNF.  Pt is not vaccinated for covid.  PCP in place.                  Expected Discharge Plan: Skilled Nursing Facility Barriers to Discharge: Continued Medical Work up   Patient Goals and CMS Choice        Expected Discharge Plan and Services Expected Discharge Plan: Skilled Nursing Facility     Post Acute Care Choice:  (daughter requesting LTAC) Living arrangements for the past 2 months: Skilled Nursing Facility (was hospitalized at [facility] for 30+ days prior to SNF)                                      Prior Living Arrangements/Services Living arrangements for the past 2 months: Skilled Nursing Facility (was hospitalized at [facility] for 30+ days prior to SNF) Lives with:: Adult Human resources officer (Comment) (daughter's roomate) Patient language and need for interpreter reviewed:: No        Need for Family Participation in Patient Care: Yes (Comment) Care giver support system in place?: Yes (comment) Current home services: Other (comment) (none) Criminal Activity/Legal Involvement Pertinent to Current Situation/Hospitalization: No - Comment as needed  Activities of  Daily Living Home Assistive Devices/Equipment: None ADL Screening (condition at time of admission) Patient's cognitive ability adequate to safely complete daily activities?: No Patient able to express need for assistance with ADLs?: No Does the patient have difficulty dressing or bathing?: Yes Independently performs ADLs?: No Communication: Dependent Is this a change from baseline?: Change from baseline, expected to last <3 days Dressing (OT): Dependent Is this a change from baseline?: Change from baseline, expected to last <3days Grooming: Dependent Is this a change from baseline?: Change from baseline, expected to last <3 days Feeding: Dependent Is this a change from baseline?: Change from baseline, expected to last <3 days Bathing: Dependent Is this a change from baseline?: Change from baseline, expected to last <3 days Toileting: Dependent Is this a change from baseline?: Change from baseline, expected to last <3 days In/Out Bed: Dependent Is this a change from baseline?: Change from baseline, expected to last <3 days Walks in Home: Dependent Is this a change from baseline?: Change from baseline, expected to last <3 days Does the patient have difficulty walking or climbing stairs?: Yes Weakness of Legs: Both Weakness of Arms/Hands: Both  Permission Sought/Granted                  Emotional Assessment Appearance:: Appears stated age Attitude/Demeanor/Rapport: Unable to Assess Affect (typically observed): Unable to Assess  Orientation: : Oriented to Self Alcohol / Substance Use: Not Applicable Psych Involvement: Outpatient Provider (Dr Sandria Manly)  Admission diagnosis:  COPD (chronic obstructive pulmonary disease) (HCC) [J44.9] Patient Active Problem List   Diagnosis Date Noted  . COPD with acute exacerbation (HCC) 06/30/2020  . COPD (chronic obstructive pulmonary disease) (HCC) 06/30/2020  . Abdominal distention   . Facial cellulitis 02/04/2016  . Diabetes mellitus type  2, controlled (HCC) 02/04/2016  . Normocytic normochromic anemia 02/04/2016  . Dementia (HCC) 02/04/2016  . Bipolar I disorder (HCC) 02/04/2016  . Cellulitis 02/03/2016   PCP:  [name], PA-C Pharmacy:  No Pharmacies Listed    Social Determinants of Health (SDOH) Interventions    Readmission Risk Interventions No flowsheet data found.

## 2020-07-01 NOTE — Evaluation (Signed)
Physical Therapy Evaluation Patient Details Name: Stephanie Rice MRN: [id number] DOB: [birth year] Today's Date: 07/01/2020   History of Present Illness  Stephanie Rice is a 65 y.o. female with medical history significant of chronic hypoxic and hypercapnic respiratory failure secondary COPD, on Trelegy PRN +2 L PRN, HTN, recent diagnosed DVT, morbid obesity, IDDM, Dementia, bipolar disorder, presented with altered mentation.  Clinical Impression  Patient received alert, lying diagonally in bed, covers off. Patient able to answer some questions, but unsure of accuracy. With attempts to assess strength or ROM patient does not follow direction well and will not let me touch or move her legs. She pulls away, stating "No". Patient would require total assist at this time for mobility due to poor cognition and poor safety awareness. Patient will continue to benefit from skilled PT while here for functional mobility and strengthening if she is able to participate and cognition is improved. Will see how she does over the next few visits.      Follow Up Recommendations SNF;Supervision/Assistance - 24 hour    Equipment Recommendations  None recommended by PT;Other (comment) (TBD)    Recommendations for Other Services       Precautions / Restrictions Precautions Precautions: Fall Precaution Comments: monitor O2 Restrictions Weight Bearing Restrictions: No      Mobility  Bed Mobility Overal bed mobility: Needs Assistance             General bed mobility comments: moving legs in bed throughout session, lying diagonally in bed. unable to fully/safely assess EOB activities due to cognition    Transfers                 General transfer comment: unable/deferred  Ambulation/Gait             General Gait Details: unable due to cognition  Stairs            Wheelchair Mobility    Modified Rankin (Stroke Patients Only)       Balance Overall balance assessment:  (to  be further assessed)                                           Pertinent Vitals/Pain Pain Assessment: Faces Faces Pain Scale: No hurt Pain Intervention(s): Monitored during session    Home Living Family/patient expects to be discharged to:: Skilled nursing facility                 Additional Comments: Pt came from [place] per chart. Unsure if long term resident or there for short term rehab. Pt unable to clarify answers    Prior Function Level of Independence: Needs assistance         Comments: Unsure of true PLOF as pt poor historian with varying answers     Hand Dominance   Dominant Hand: Right (assumed based on increase use of R hand)    Extremity/Trunk Assessment   Upper Extremity Assessment Upper Extremity Assessment: Defer to OT evaluation RUE Deficits / Details: reports arthritis in shoulders, actively lifts to 45*. able to throw washcloth at therapist. 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 LUE Coordination: decreased fine motor;decreased gross motor    Lower Extremity Assessment Lower Extremity Assessment: Difficult to assess due to impaired cognition;RLE deficits/detail;LLE deficits/detail RLE Deficits / Details: patient able to move legs  in bed at will    Cervical / Trunk Assessment Cervical / Trunk Assessment:  (to be further assessed)  Communication   Communication: Expressive difficulties;Receptive difficulties  Cognition Arousal/Alertness: Awake/alert Behavior During Therapy: Restless;Impulsive Overall Cognitive Status: No family/caregiver present to determine baseline cognitive functioning Area of Impairment: Orientation;Attention;Memory;Following commands;Safety/judgement;Awareness;Problem solving                 Orientation Level: Disoriented to;Place;Time;Situation Current Attention Level: Focused Memory:  Decreased short-term memory Following Commands: Follows one step commands inconsistently Safety/Judgement: Decreased awareness of safety;Decreased awareness of deficits Awareness: Intellectual Problem Solving: Slow processing;Difficulty sequencing;Requires verbal cues;Requires tactile cues General Comments: Hx of dementia per PMH. Pt able to identify name, intermittent varying yes/no answers. Tangential comments and varying answers. Pt initially reports living alone, later reports daughter lives with her. Inconsistent following of commands. Reports "yes" to washing face then throws washcloth at therapist once placed in hand. unable to redirect for meaningful activities. Unable to follow direction for assessment of LEs. (although moving legs in bed voluntarily)      General Comments General comments (skin integrity, edema, etc.): Pt on 12 L HFNC, SpO2 at 85%. Pt noted to be breathing through mouth, attempted to educate in pursed lip breathing to increase sats but poor carryover    Exercises     Assessment/Plan    PT Assessment Patient needs continued PT services  PT Problem List Decreased mobility;Decreased safety awareness       PT Treatment Interventions Therapeutic activities;Therapeutic exercise;Gait training;Functional mobility training;Balance training;Patient/family education    PT Goals (Current goals can be found in the Care Plan section)  Acute Rehab PT Goals Patient Stated Goal: unable to state PT Goal Formulation: Patient unable to participate in goal setting Time For Goal Achievement: 07/15/20    Frequency Min 2X/week   Barriers to discharge        Co-evaluation PT/OT/SLP Co-Evaluation/Treatment: Yes Reason for Co-Treatment: Necessary to address cognition/behavior during functional activity;For patient/therapist safety;To address functional/ADL transfers PT goals addressed during session: Mobility/safety with mobility OT goals addressed during session: ADL's and  self-care;Strengthening/ROM       AM-PAC PT "6 Clicks" Mobility  Outcome Measure Help needed turning from your back to your side while in a flat bed without using bedrails?: A Lot Help needed moving from lying on your back to sitting on the side of a flat bed without using bedrails?: Total Help needed moving to and from a bed to a chair (including a wheelchair)?: Total Help needed standing up from a chair using your arms (e.g., wheelchair or bedside chair)?: Total Help needed to walk in hospital room?: Total Help needed climbing 3-5 steps with a railing? : Total 6 Click Score: 7    End of Session Equipment Utilized During Treatment: Oxygen Activity Tolerance: Other (comment) (assessment limited by cognition.) Patient left: in bed;with bed alarm set Nurse Communication: Mobility status PT Visit Diagnosis: Other abnormalities of gait and mobility (R26.89)    Time: 1008-1020 PT Time Calculation (min) (ACUTE ONLY): 12 min   Charges:   PT Evaluation $PT Eval Moderate Complexity: 1 Mod          Aariona Momon, PT, GCS 07/01/20,11:40 AM

## 2020-07-01 NOTE — Evaluation (Signed)
Clinical/Bedside Swallow Evaluation Patient Details  Name: Stephanie Rice MRN: [id number] Date of Birth: 10/30/1955  Today's Date: 07/01/2020 Time: SLP Start Time (ACUTE ONLY): 1025 SLP Stop Time (ACUTE ONLY): 1040 SLP Time Calculation (min) (ACUTE ONLY): 15 min  Past Medical History:  Past Medical History:  Diagnosis Date  . Arthritis   . Barrett esophagus   . Bipolar 1 disorder (HCC)   . COPD (chronic obstructive pulmonary disease) (HCC)   . Dementia (HCC)   . Diabetes mellitus without complication (HCC)   . GERD (gastroesophageal reflux disease)    Past Surgical History:  Past Surgical History:  Procedure Laterality Date  . BREAST CYST EXCISION    . BREAST SURGERY     HPI:  Pt is a 65 y.o. female with medical history significant of chronic hypoxic and hypercapnic respiratory failure secondary COPD, on Trelegy PRN +2 L PRN, HTN, recent lydiagnosed DVT, morbid obesity, IDDM, Dementia, bipolar disorder, who presented with altered mentation. CT head was negative. CXR 2/15: Right middle lobe consolidation most compatible with pneumonia. MD considered this to be residue imaging changes from a PNA pt had last month, vs a new infection and coverage was expanded for aspiration. Per EMR, pt's daughter has reported that pt has chronic dysphagia on pureed diet, with strict instruction of sitting up 30 min before feed.   Assessment / Plan / Recommendation Clinical Impression  Pt was seen for bedside swallow evaluation. Per EMR, pt does have a history of dysphagia and consumes puree solids at baseline. Attempts were made to contact the pt's daughter for further information regarding pt's baseline swallow function, but she could not be reached. Oral mechanism exam was limited due to pt's difficulty following commands; however, oral motor strength and ROM appeared grossly WFL. Oral mucosa was dry and pt was edentulous. She tolerated puree solids without overt s/sx of aspiration. Mastication was  absent with dysphagia 2 solids and coughing was noted thereafter. Pt demonstrated impulsive tendencies and exhibited a mildly wet vocal quality following consecutive swallows of thin liquids via straw. A modified barium swallow study is recommended to further assess swallow function. It is scheduled for today at 1330 and diet recommendations will be provided thereafter. SLP Visit Diagnosis: Dysphagia, unspecified (R13.10)    Aspiration Risk  Mild aspiration risk    Diet Recommendation  (TBD following instrumental assessment)   Medication Administration: Crushed with puree Postural Changes: Seated upright at 90 degrees    Other  Recommendations Oral Care Recommendations: Oral care QID   Follow up Recommendations  (TBD)      Frequency and Duration min 2x/week  1 week       Prognosis Prognosis for Safe Diet Advancement: Good Barriers to Reach Goals: Time post onset;Cognitive deficits      Swallow Study   General Date of Onset: 06/30/20 HPI: Pt is a 65 y.o. female with medical history significant of chronic hypoxic and hypercapnic respiratory failure secondary COPD, on Trelegy PRN +2 L PRN, HTN, recent lydiagnosed DVT, morbid obesity, IDDM, Dementia, bipolar disorder, who presented with altered mentation. CT head was negative. CXR 2/15: Right middle lobe consolidation most compatible with pneumonia. MD considered this to be residue imaging changes from a PNA pt had last month, vs a new infection and coverage was expanded for aspiration. Per EMR, pt's daughter has reported that pt has chronic dysphagia on pureed diet, with strict instruction of sitting up 30 min before feed. Type of Study: Bedside Swallow Evaluation Previous Swallow Assessment: None Diet Prior  to this Study: NPO Temperature Spikes Noted: No Respiratory Status: Nasal cannula History of Recent Intubation: No Behavior/Cognition: Alert;Cooperative;Pleasant mood Oral Cavity Assessment: Within Functional Limits Oral Care  Completed by SLP: No Oral Cavity - Dentition: Edentulous Vision: Functional for self-feeding Self-Feeding Abilities: Needs assist Patient Positioning: Upright in bed;Postural control adequate for testing Baseline Vocal Quality: Normal    Oral/Motor/Sensory Function Overall Oral Motor/Sensory Function: Within functional limits (with limited assessment)   Ice Chips Ice chips: Within functional limits Presentation: Spoon   Thin Liquid Thin Liquid: Impaired Presentation: Straw Pharyngeal  Phase Impairments: Wet Vocal Quality (mildly wet)    Nectar Thick Nectar Thick Liquid: Not tested   Honey Thick Honey Thick Liquid: Not tested   Puree Puree: Within functional limits Presentation: Spoon   Solid     Solid: Impaired Presentation: Spoon Oral Phase Impairments: Impaired mastication (absent mastication) Pharyngeal Phase Impairments: Cough - Delayed     Stephanie Rice I. Stephanie Clock, MS, CCC-SLP Acute Rehabilitation Services Office number [phone] Pager [phone]  Stephanie Rice 07/01/2020,10:51 AM

## 2020-07-01 NOTE — Progress Notes (Signed)
Phlebotomy attempted to draw labs, patient swinging arms and unable to obtain labs at this time.

## 2020-07-01 NOTE — Progress Notes (Signed)
Patient pulled iv out and pulling off bipap.  Placed on HFNC @ 12L.  mouthcare performed.

## 2020-07-01 NOTE — NC FL2 (Signed)
MEDICAID FL2 LEVEL OF CARE SCREENING TOOL     IDENTIFICATION  Patient Name: Stephanie Rice Birthdate: [birth year] Sex: female Admission Date (Current Location): 06/30/2020  [place] and [place] Number:  Haynes Bast [id number] P Facility and Address:  [facility], [address] [place], [place] [address]      Provider Number: [id number]  Attending Physician Name and Address:  Osvaldo Shipper, MD  Relative Name and Phone Number:  Aniesa, Boback Daughter [phone]    Current Level of Care: Hospital Recommended Level of Care: Skilled Nursing Facility Prior Approval Number:    Date Approved/Denied:   PASRR Number: [id number]  Discharge Plan:      Current Diagnoses: Patient Active Problem List   Diagnosis Date Noted  . COPD with acute exacerbation (HCC) 06/30/2020  . COPD (chronic obstructive pulmonary disease) (HCC) 06/30/2020  . Abdominal distention   . Facial cellulitis 02/04/2016  . Diabetes mellitus type 2, controlled (HCC) 02/04/2016  . Normocytic normochromic anemia 02/04/2016  . Dementia (HCC) 02/04/2016  . Bipolar I disorder (HCC) 02/04/2016  . Cellulitis 02/03/2016    Orientation RESPIRATION BLADDER Height & Weight     Self  Other (Comment) (bipap) Incontinent Weight:   Height:     BEHAVIORAL SYMPTOMS/MOOD NEUROLOGICAL BOWEL NUTRITION STATUS      Continent Diet (see discharge summary)  AMBULATORY STATUS COMMUNICATION OF NEEDS Skin   Total Care Verbally Surgical wounds                       Personal Care Assistance Level of Assistance  Bathing,Feeding,Dressing Bathing Assistance: Maximum assistance Feeding assistance: Limited assistance Dressing Assistance: Maximum assistance     Functional Limitations Info  Sight,Hearing,Speech Sight Info: Adequate Hearing Info: Adequate Speech Info: Adequate    SPECIAL CARE FACTORS FREQUENCY  PT (By licensed PT),OT (By licensed OT),Speech therapy     PT Frequency: 5x  week OT Frequency: 5x week     Speech Therapy Frequency: 2x week      Contractures Contractures Info: Not present    Additional Factors Info  Code Status,Allergies,Insulin Sliding Scale Code Status Info: DNR Allergies Info: Ciprofloxacin, Nitrofurantoin, Dextromethorphan-quinidine, Ziprasidone Hcl   Insulin Sliding Scale Info: Novolog. 0-15 units 3x day with meals.  0-5 units at bedtime.  See discharge summary.       Current Medications (07/01/2020):  This is the current hospital active medication list Current Facility-Administered Medications  Medication Dose Route Frequency Provider Last Rate Last Admin  . albuterol (PROVENTIL) (2.5 MG/3ML) 0.083% nebulizer solution 2.5 mg  2.5 mg Nebulization Q4H PRN Mikey College T, MD      . apixaban Everlene Balls) tablet 5 mg  5 mg Oral BID Mikey College T, MD      . atorvastatin (LIPITOR) tablet 20 mg  20 mg Oral QHS Mikey College T, MD      . cefTRIAXone (ROCEPHIN) 2 g in sodium chloride 0.9 % 100 mL IVPB  2 g Intravenous QHS Mikey College T, MD 200 mL/hr at 06/30/20 2255 2 g at 06/30/20 2255  . clonazePAM (KLONOPIN) disintegrating tablet 0.25 mg  0.25 mg Oral TID Mikey College T, MD      . cyclobenzaprine (FLEXERIL) tablet 5 mg  5 mg Oral TID PRN Mikey College T, MD      . doxycycline (VIBRA-TABS) tablet 100 mg  100 mg Oral Q12H Zhang, Ilda Foil T, MD      . fluticasone furoate-vilanterol (BREO ELLIPTA) 100-25 MCG/INH 1 puff  1 puff Inhalation  Daily Mikey College T, MD      . gabapentin (NEURONTIN) capsule 100 mg  100 mg Oral TID Mikey College T, MD      . haloperidol lactate (HALDOL) injection 0.5 mg  0.5 mg Intravenous Q6H PRN Osvaldo Shipper, MD   0.5 mg at 07/01/20 1522  . hydrALAZINE (APRESOLINE) tablet 25 mg  25 mg Oral Q6H PRN Mikey College T, MD      . insulin aspart (novoLOG) injection 0-15 Units  0-15 Units Subcutaneous TID WC Emeline General, MD   5 Units at 07/01/20 1230  . insulin aspart (novoLOG) injection 0-5 Units  0-5 Units Subcutaneous QHS Mikey College  T, MD      . insulin detemir (LEVEMIR) injection 20 Units  20 Units Subcutaneous Daily Emeline General, MD   20 Units at 07/01/20 1244  . loxapine (LOXITANE) capsule 20 mg  20 mg Oral QHS Mikey College T, MD      . melatonin tablet 20 mg  20 mg Oral QHS Mikey College T, MD      . methylPREDNISolone sodium succinate (SOLU-MEDROL) 40 mg/mL injection 40 mg  40 mg Intravenous Q6H Mikey College T, MD   40 mg at 07/01/20 1522   Followed by  . [START ON 07/02/2020] predniSONE (DELTASONE) tablet 40 mg  40 mg Oral Q breakfast Mikey College T, MD      . montelukast (SINGULAIR) tablet 10 mg  10 mg Oral QHS Mikey College T, MD      . pantoprazole (PROTONIX) EC tablet 40 mg  40 mg Oral Daily Mikey College T, MD      . polyvinyl alcohol (LIQUIFILM TEARS) 1.4 % ophthalmic solution 1 drop  1 drop Both Eyes Q breakfast Mikey College T, MD   1 drop at 07/01/20 [id number]  . primidone (MYSOLINE) tablet 50 mg  50 mg Oral q morning Mikey College T, MD      . sodium zirconium cyclosilicate (LOKELMA) packet 10 g  10 g Oral TID Osvaldo Shipper, MD      . topiramate (TOPAMAX) tablet 100 mg  100 mg Oral Daily Mikey College T, MD       And  . topiramate (TOPAMAX) tablet 200 mg  200 mg Oral Daily Mikey College T, MD      . valproic acid (DEPAKENE) 250 MG/5ML solution 1,000 mg  1,000 mg Oral QHS Emeline General, MD         Discharge Medications: Please see discharge summary for a list of discharge medications.  Relevant Imaging Results:  Relevant Lab Results:   Additional Information SSN [id number]  [name], LCSW

## 2020-07-01 NOTE — Evaluation (Signed)
Occupational Therapy Evaluation Patient Details Name: Stephanie Rice MRN: [id number] DOB: October 25, 1955 Today's Date: 07/01/2020    History of Present Illness Stephanie Rice is a 65 y.o. female with medical history significant of chronic hypoxic and hypercapnic respiratory failure secondary COPD, on Trelegy PRN +2 L PRN, HTN, recent diagnosed DVT, morbid obesity, IDDM, Dementia, bipolar disorder, presented with altered mentation.   Clinical Impression   PTA, pt from [facility]. Unsure of functional status at SNF as pt poor historian. Pt overall with varying answers to questions and inconsistent following of commands. Attempted to engage pt in simple ADLs prior to EOB attempts with pt initially agreeable then threw washcloth at therapist. Unable to safely progress to EOB/OOB due to cognition. Plan to progress OOB activities, sequencing, and safety as appropriate and if cognition clears. Recommend DC back to SNF.   Pt received on 12 L HFNC, SpO2 reading at 85%. HR WFL. Pt with poor carryover of pursed lip breathing cues, unable to follow directions.     Follow Up Recommendations  SNF;Supervision/Assistance - 24 hour    Equipment Recommendations  None recommended by OT    Recommendations for Other Services       Precautions / Restrictions Precautions Precautions: Fall;Other (comment) Precaution Comments: monitor O2 Restrictions Weight Bearing Restrictions: No      Mobility Bed Mobility Overal bed mobility: Needs Assistance             General bed mobility comments: moving legs in bed throughout session, lying diagonally in bed. unable to fully/safely assess EOB activities due to cognition    Transfers                 General transfer comment: unable/deferred    Balance Overall balance assessment:  (to be further assessed)                                         ADL either performed or assessed with clinical judgement   ADL Overall  ADL's : Needs assistance/impaired Eating/Feeding: NPO   Grooming: Total assistance;Bed level;Wash/dry face   Upper Body Bathing: Bed level;Total assistance   Lower Body Bathing: Total assistance;Bed level   Upper Body Dressing : Bed level;Total assistance   Lower Body Dressing: Total assistance;Bed level       Toileting- Clothing Manipulation and Hygiene: Total assistance;Bed level         General ADL Comments: Inconsistently following commands, varying answers due to impaired cognition. 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 able to throw washcloth at therapist. 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 LUE Coordination: decreased fine motor;decreased gross motor   Lower Extremity Assessment Lower Extremity Assessment: Defer to PT evaluation   Cervical / Trunk Assessment Cervical / Trunk Assessment:  (to be  further assessed)   Communication Communication Communication: Expressive difficulties;Receptive difficulties   Cognition Arousal/Alertness: Awake/alert Behavior During Therapy: Restless;Impulsive Overall Cognitive Status: History of cognitive impairments - at baseline Area of Impairment: Orientation;Attention;Memory;Following  commands;Safety/judgement;Awareness;Problem solving                 Orientation Level: Disoriented to;Place;Time;Situation Current Attention Level: Focused Memory: Decreased short-term memory Following Commands: Follows one step commands inconsistently Safety/Judgement: Decreased awareness of safety;Decreased awareness of deficits Awareness: Intellectual Problem Solving: Slow processing;Difficulty sequencing;Requires verbal cues;Requires tactile cues General Comments: Hx of dementia per PMH. Pt able to identify name, intermittent varying yes/no answers. Tangential comments and varying answers. Pt initially reports living alone, later reports daughter lives with her. Inconsistent following of commands. Reports "yes" to washing face then throws washcloth at therapist once placed in hand. unable to redirect for meaningful activities   General Comments  Pt on 12 L HFNC, SpO2 at 85%. Pt noted to be breathing through mouth, attempted to educate in pursed lip breathing to increase sats but poor carryover    Exercises     Shoulder Instructions      Home Living Family/patient expects to be discharged to:: Skilled nursing facility                                 Additional Comments: Pt came from [place] per chart. Unsure if long term resident or there for short term rehab. Pt unable to clarify answers      Prior Functioning/Environment Level of Independence: Needs assistance        Comments: Unsure of true PLOF as pt poor historian with varying answers        OT Problem List: Decreased strength;Decreased activity tolerance;Impaired balance (sitting and/or standing);Decreased coordination;Decreased cognition;Decreased safety awareness;Decreased knowledge of use of DME or AE;Cardiopulmonary status limiting activity      OT Treatment/Interventions: Self-care/ADL training;Therapeutic exercise;DME and/or AE instruction;Therapeutic activities;Patient/family  education;Balance training;Energy conservation    OT Goals(Current goals can be found in the care plan section) Acute Rehab OT Goals Patient Stated Goal: unable to state OT Goal Formulation: Patient unable to participate in goal setting Time For Goal Achievement: 07/15/20 Potential to Achieve Goals: Fair  OT Frequency: Min 2X/week   Barriers to D/C:            Co-evaluation PT/OT/SLP Co-Evaluation/Treatment: Yes Reason for Co-Treatment: Necessary to address cognition/behavior during functional activity;Complexity of the patient's impairments (multi-system involvement);For patient/therapist safety;To address functional/ADL transfers   OT goals addressed during session: ADL's and self-care;Strengthening/ROM      AM-PAC OT "6 Clicks" Daily Activity     Outcome Measure Help from another person eating meals?: Total Help from another person taking care of personal grooming?: Total Help from another person toileting, which includes using toliet, bedpan, or urinal?: Total Help from another person bathing (including washing, rinsing, drying)?: Total Help from another person to put on and taking off regular upper body clothing?: Total Help from another person to put on and taking off regular lower body clothing?: Total 6 Click Score: 6   End of Session Equipment Utilized During Treatment: Oxygen  Activity Tolerance: Other (comment) (limited by cognition) Patient left: in bed;with call bell/phone within reach;with bed alarm set  OT Visit Diagnosis: Unsteadiness on feet (R26.81);Other abnormalities of gait and mobility (R26.89);Muscle weakness (generalized) (M62.81);Other symptoms and signs involving cognitive function;Other (comment) (decreased cardiopulmonary tolerance)  Time: 2263-3354 OT Time Calculation (min): 8 min Charges:  OT General Charges $OT Visit: 1 Visit OT Evaluation $OT Eval Moderate Complexity: 1 Mod  Acute Rehab Lanier Prude,  OTR/L 07/01/2020, 11:12 AM

## 2020-07-01 NOTE — Progress Notes (Signed)
Inpatient Diabetes Program Recommendations  AACE/ADA: New Consensus Statement on Inpatient Glycemic Control   Target Ranges:  Prepandial:   less than 140 mg/dL      Peak postprandial:   less than 180 mg/dL (1-2 hours)      Critically ill patients:  140 - 180 mg/dL   Results for [facility] (MRN [id number]) as of 07/01/2020 12:12  Ref. Range 06/30/2020 17:08 06/30/2020 23:53 07/01/2020 07:46 07/01/2020 12:08  Glucose-Capillary Latest Ref Range: 70 - 99 mg/dL 256 (H) 389 (H) 373 (H) 225 (H)   Review of Glycemic Control  Diabetes history: DM2 Outpatient Diabetes medications: Levemir 20 units QHS, Humalog 0-9 units TID Current orders for Inpatient glycemic control: Levemir 20 units daily, Novolog 0-15 units TID with meals, Novolog 0-5 units QHS; Solumedrol 40 mg Q6H  Inpatient Diabetes Program Recommendations:    Insulin: In reviewing chart, noted patient did NOT receive Levemir yesterday and has not received it yet today (charted today as NOT GIVEN, requested from pharmacy and not received. Still waiting pharmacy verification). Sent communication to April,RN to follow up with pharmacy to ensure patient receives Levemir today as ordered.  Thanks, Orlando Penner, RN, MSN, CDE Diabetes Coordinator Inpatient Diabetes Program [phone] (Team Pager from 8am to 5pm)

## 2020-07-02 ENCOUNTER — Inpatient Hospital Stay (HOSPITAL_COMMUNITY): Payer: Medicare Other

## 2020-07-02 DIAGNOSIS — J441 Chronic obstructive pulmonary disease with (acute) exacerbation: Secondary | ICD-10-CM | POA: Diagnosis not present

## 2020-07-02 DIAGNOSIS — N390 Urinary tract infection, site not specified: Secondary | ICD-10-CM | POA: Diagnosis not present

## 2020-07-02 DIAGNOSIS — J9621 Acute and chronic respiratory failure with hypoxia: Secondary | ICD-10-CM | POA: Diagnosis not present

## 2020-07-02 DIAGNOSIS — I480 Paroxysmal atrial fibrillation: Secondary | ICD-10-CM

## 2020-07-02 DIAGNOSIS — G9341 Metabolic encephalopathy: Secondary | ICD-10-CM | POA: Diagnosis not present

## 2020-07-02 LAB — CBC
HCT: 41.9 % (ref 36.0–46.0)
Hemoglobin: 12.6 g/dL (ref 12.0–15.0)
MCH: 30.5 pg (ref 26.0–34.0)
MCHC: 30.1 g/dL (ref 30.0–36.0)
MCV: 101.5 fL — ABNORMAL HIGH (ref 80.0–100.0)
Platelets: 208 K/uL (ref 150–400)
RBC: 4.13 MIL/uL (ref 3.87–5.11)
RDW: 14.7 % (ref 11.5–15.5)
WBC: 5.1 K/uL (ref 4.0–10.5)
nRBC: 0.4 % — ABNORMAL HIGH (ref 0.0–0.2)

## 2020-07-02 LAB — BASIC METABOLIC PANEL
Anion gap: 11 (ref 5–15)
BUN: 22 mg/dL (ref 8–23)
CO2: 33 mmol/L — ABNORMAL HIGH (ref 22–32)
Calcium: 8.5 mg/dL — ABNORMAL LOW (ref 8.9–10.3)
Chloride: 97 mmol/L — ABNORMAL LOW (ref 98–111)
Creatinine, Ser: 0.5 mg/dL (ref 0.44–1.00)
GFR, Estimated: >60 mL/min (ref >60)
Glucose, Bld: 215 mg/dL — ABNORMAL HIGH (ref 70–99)
Potassium: 5.4 mmol/L — ABNORMAL HIGH (ref 3.5–5.1)
Sodium: 141 mmol/L (ref 135–145)

## 2020-07-02 LAB — GLUCOSE, CAPILLARY
Glucose-Capillary: 188 mg/dL — ABNORMAL HIGH (ref 70–99)
Glucose-Capillary: 201 mg/dL — ABNORMAL HIGH (ref 70–99)
Glucose-Capillary: 208 mg/dL — ABNORMAL HIGH (ref 70–99)
Glucose-Capillary: 154 mg/dL — ABNORMAL HIGH (ref 70–99)

## 2020-07-02 LAB — TROPONIN I (HIGH SENSITIVITY)
Troponin I (High Sensitivity): 9 ng/L (ref <18)
Troponin I (High Sensitivity): 12 ng/L (ref <18)

## 2020-07-02 LAB — D-DIMER, QUANTITATIVE: D-Dimer, Quant: 0.33 ug/mL-FEU (ref 0.00–0.50)

## 2020-07-02 LAB — TSH: TSH: 2.169 u[IU]/mL (ref 0.350–4.500)

## 2020-07-02 LAB — PROCALCITONIN: Procalcitonin: <0.1 ng/mL

## 2020-07-02 LAB — MAGNESIUM: Magnesium: 2 mg/dL (ref 1.7–2.4)

## 2020-07-02 MED ORDER — METOPROLOL TARTRATE 5 MG/5ML IV SOLN
5.0000 mg | Freq: Once | INTRAVENOUS | Status: AC
  Administered 2020-07-02: 5 mg via INTRAVENOUS
  Filled 2020-07-02: qty 5

## 2020-07-02 MED ORDER — VALPROIC ACID 250 MG/5ML PO SOLN
250.0000 mg | Freq: Four times a day (QID) | ORAL | Status: DC
  Administered 2020-07-02 – 2020-07-05 (×14): 250 mg via ORAL
  Filled 2020-07-02 (×17): qty 5

## 2020-07-02 MED ORDER — MELATONIN 5 MG PO TABS
5.0000 mg | ORAL_TABLET | Freq: Every day | ORAL | Status: DC
  Administered 2020-07-02 – 2020-08-06 (×35): 5 mg via ORAL
  Filled 2020-07-02 (×36): qty 1

## 2020-07-02 MED ORDER — METOPROLOL TARTRATE 12.5 MG HALF TABLET
12.5000 mg | ORAL_TABLET | Freq: Two times a day (BID) | ORAL | Status: DC
  Administered 2020-07-02 – 2020-07-08 (×12): 12.5 mg via ORAL
  Filled 2020-07-02 (×12): qty 1

## 2020-07-02 MED ORDER — SODIUM CHLORIDE 0.9 % IV BOLUS
500.0000 mL | Freq: Once | INTRAVENOUS | Status: AC
  Administered 2020-07-02: 500 mL via INTRAVENOUS

## 2020-07-02 MED ORDER — SODIUM ZIRCONIUM CYCLOSILICATE 10 G PO PACK
10.0000 g | PACK | Freq: Three times a day (TID) | ORAL | Status: AC
  Administered 2020-07-02 (×2): 10 g via ORAL
  Filled 2020-07-02 (×2): qty 1

## 2020-07-02 MED ORDER — DILTIAZEM HCL 25 MG/5ML IV SOLN
5.0000 mg | Freq: Once | INTRAVENOUS | Status: AC
  Administered 2020-07-02: 5 mg via INTRAVENOUS
  Filled 2020-07-02: qty 5

## 2020-07-02 NOTE — Progress Notes (Addendum)
[facility] PROGRESS NOTE   Stephanie Rice DXA:[id number] DOB: 1956-01-06 DOA: 06/30/2020  PCP: [name], PA-C  Brief History/Interval Summary: 65 y.o. female with medical history significant of chronic hypoxic and hypercapnic respiratory failure secondary COPD, on Trelegy PRN +2 L PRN, HTN, recent diagnosed DVT, morbid obesity, IDDM, Dementia, bipolar disorder, presented with altered mentation.  Patient was found to have hypercapnic respiratory failure.  Was hospitalized for further management.  Consultants: None  Procedures: None yet  Antibiotics: Anti-infectives (From admission, onward)   Start     Dose/Rate Route Frequency Ordered Stop   06/30/20 2200  doxycycline (VIBRA-TABS) tablet 100 mg        100 mg Oral Every 12 hours 06/30/20 1612 07/05/20 2159   06/30/20 2030  cefTRIAXone (ROCEPHIN) 2 g in sodium chloride 0.9 % 100 mL IVPB        2 g 200 mL/hr over 30 Minutes Intravenous Daily at bedtime 06/30/20 2006     06/30/20 1800  Ampicillin-Sulbactam (UNASYN) 3 g in sodium chloride 0.9 % 100 mL IVPB  Status:  Discontinued        3 g 200 mL/hr over 30 Minutes Intravenous Every 6 hours 06/30/20 1703 06/30/20 1954      Subjective/Interval History: Overnight events noted.  Patient noted to be pleasantly confused this morning. denies any chest pain or shortness of breath.  Does not appear to be in any discomfort or distress.      Assessment/Plan:  Acute metabolic encephalopathy Likely secondary to hypercapnia along with the urinary tract infection.  No focal deficits noted.  CT head was reassuring.  Her mentation could be back to her baseline.  Patient with known history of bipolar disorder and dementia.  Continue aspiration precautions.  PT and OT evaluation.   Acute COPD exacerbation with acute on chronic hypoxic and hypercapnic respiratory failure/community-acquired pneumonia Patient was placed on BiPAP.  Respiratory status appears to be stable.   Continue  BiPAP every night during daytime hours if she looks somnolent.  Patient apparently on trilogy vent prior to admission. Continue with steroids nebulizer treatments and doxycycline.  ABG did show improvement in pH.  Influenza and COVID-19 test were negative.    Atrial fibrillation with RVR Patient went into rapid atrial fibrillation this morning.  Was given Cardizem and metoprolol.  Seems to be back in sinus rhythm today.  We will place her on metoprolol.  Likely brought on by her acute respiratory illness.   Echocardiogram shows normal systolic function.  Mild LVH was noted.  No significant valvular abnormalities noted.  TSH noted to be normal.  Troponins were normal.  Patient already on Eliquis for history of DVT.  Urinary tract infection/urinary retention Continue ceftriaxone.  Urine culture with multiple species  Continue bladder scans every shift.  May need to do in and out catheterization.  Mild hyperkalemia Sodium slightly better though remains elevated.  Additional dose of Lokelma will be provided today.  Recheck labs tomorrow.  Essential hypertension Due to low blood pressures clonidine was discontinued.  Patient to be placed on metoprolol for A. fib.  Monitor blood pressures closely.  Diabetes mellitus type 2, on insulin, uncontrolled with hyperglycemia CBGs noted to be elevated.  Patient remains on Lantus and SSI.  HbA1c 7.2.    Diabetic neuropathy Noted to be on gabapentin.  History of bipolar disorder Patient noted to be on loxapine and valproic acid.  History of DVT Continue Eliquis.  Morbid obesity Estimated body mass index is 43.75 kg/m  as calculated from the following:   Height as of 02/04/16: 5\' 2"  (1.575 m).   Weight as of 02/04/16: 108.5 kg.    DVT Prophylaxis: On Eliquis Code Status: DNR Family Communication: Daughter was updated yesterday Disposition Plan: Patient will need to go to SNF when medically improved.  Status is: Inpatient  Remains inpatient  appropriate because:Altered mental status, IV treatments appropriate due to intensity of illness or inability to take PO and Inpatient level of care appropriate due to severity of illness   Dispo: The patient is from: SNF              Anticipated d/c is to: SNF              Anticipated d/c date is: 3 days              Patient currently is not medically stable to d/c.   Difficult to place patient No      Medications:  Scheduled: . apixaban  5 mg Oral BID  . atorvastatin  20 mg Oral QHS  . clonazePAM  0.25 mg Oral TID  . doxycycline  100 mg Oral Q12H  . fluticasone furoate-vilanterol  1 puff Inhalation Daily  . gabapentin  100 mg Oral TID  . insulin aspart  0-15 Units Subcutaneous TID WC  . insulin aspart  0-5 Units Subcutaneous QHS  . insulin detemir  20 Units Subcutaneous Daily  . loxapine  20 mg Oral QHS  . melatonin  20 mg Oral QHS  . metoprolol tartrate  12.5 mg Oral BID  . montelukast  10 mg Oral QHS  . pantoprazole  40 mg Oral Daily  . polyvinyl alcohol  1 drop Both Eyes Q breakfast  . predniSONE  40 mg Oral Q breakfast  . primidone  50 mg Oral q morning  . sodium zirconium cyclosilicate  10 g Oral TID  . topiramate  100 mg Oral Daily   And  . topiramate  200 mg Oral Daily  . valproic acid  1,000 mg Oral QHS   Continuous: . cefTRIAXone (ROCEPHIN)  IV Stopped (07/02/20 0700)   07/04/20, cyclobenzaprine, haloperidol lactate, hydrALAZINE   Objective:  Vital Signs  Vitals:   07/02/20 0630 07/02/20 0700 07/02/20 0730 07/02/20 0830  BP: 97/73 (!) 127/92 99/61 (!) 140/114  Pulse: (!) 136 89 93 94  Resp: 17 16 17 20   Temp:   98.5 F (36.9 C)   TempSrc:   Oral   SpO2: 97% 98% 100% 99%    Intake/Output Summary (Last 24 hours) at 07/02/2020 0957 Last data filed at 07/02/2020 0355 Gross per 24 hour  Intake --  Output 700 ml  Net -700 ml   There were no vitals filed for this visit.   General appearance: Awake alert.  In no distress.  Distracted Resp:  Mildly tachypneic at rest.  Mild wheezing appreciated bilaterally.  No rhonchi. Cardio: S1-S2 is normal regular.  No S3-S4.  No rubs murmurs or bruit GI: Abdomen is soft.  Nontender nondistended.  Bowel sounds are present normal.  No masses organomegaly Extremities: No edema.  Moving all of her extremities Neurologic: No focal neurological deficits.      Lab Results:  Data Reviewed: I have personally reviewed following labs and imaging studies  CBC: Recent Labs  Lab 06/30/20 1226 06/30/20 1516 07/01/20 0956 07/02/20 0220  WBC 4.0  --  3.3* 5.1  NEUTROABS 1.8  --   --   --   HGB 12.0 13.3  12.2 12.6  HCT 43.1 39.0 40.4 41.9  MCV 105.9*  --  101.8* 101.5*  PLT 209  --  207 208    Basic Metabolic Panel: Recent Labs  Lab 06/30/20 1226 06/30/20 1516 07/01/20 0956 07/02/20 0220  NA 138 138 143 141  K 5.4* 5.4* 5.6* 5.4*  CL 93*  --  98 97*  CO2 36*  --  34* 33*  GLUCOSE 131*  --  254* 215*  BUN 34*  --  22 22  CREATININE 0.79  --  0.59 0.50  CALCIUM 7.9*  --  8.4* 8.5*  MG  --   --   --  2.0    GFR: CrCl cannot be calculated (Unknown ideal weight.).  Liver Function Tests: Recent Labs  Lab 06/30/20 1226  AST 15  ALT 14  ALKPHOS 64  BILITOT 0.5  PROT 6.0*  ALBUMIN 2.4*    Recent Labs  Lab 06/30/20 1226  LIPASE 19    HbA1C: Recent Labs    06/30/20 1614  HGBA1C 7.2*    CBG: Recent Labs  Lab 07/01/20 0746 07/01/20 1208 07/01/20 1539 07/01/20 2112 07/02/20 0741  GLUCAP 230* 225* 202* 183* 188*      Recent Results (from the past 240 hour(s))  Resp Panel by RT-PCR (Flu A&B, Covid) Nasopharyngeal Swab     Status: None   Collection Time: 06/30/20  3:57 PM   Specimen: Nasopharyngeal Swab; Nasopharyngeal(NP) swabs in vial transport medium  Result Value Ref Range Status   SARS Coronavirus 2 by RT PCR NEGATIVE NEGATIVE Final    Comment: (NOTE) SARS-CoV-2 target nucleic acids are NOT DETECTED.  The SARS-CoV-2 RNA is generally detectable in  upper respiratory specimens during the acute phase of infection. The lowest concentration of SARS-CoV-2 viral copies this assay can detect is 138 copies/mL. A negative result does not preclude SARS-Cov-2 infection and should not be used as the sole basis for treatment or other patient management decisions. A negative result may occur with  improper specimen collection/handling, submission of specimen other than nasopharyngeal swab, presence of viral mutation(s) within the areas targeted by this assay, and inadequate number of viral copies(<138 copies/mL). A negative result must be combined with clinical observations, patient history, and epidemiological information. The expected result is Negative.  Fact Sheet for Patients:  BloggerCourse.com  Fact Sheet for Healthcare Providers:  SeriousBroker.it  This test is no t yet approved or cleared by the [place] FDA and  has been authorized for detection and/or diagnosis of SARS-CoV-2 by FDA under an Emergency Use Authorization (EUA). This EUA will remain  in effect (meaning this test can be used) for the duration of the COVID-19 declaration under Section 564(b)(1) of the Act, 21 U.S.C.section 360bbb-3(b)(1), unless the authorization is terminated  or revoked sooner.       Influenza A by PCR NEGATIVE NEGATIVE Final   Influenza B by PCR NEGATIVE NEGATIVE Final    Comment: (NOTE) The Xpert Xpress SARS-CoV-2/FLU/RSV plus assay is intended as an aid in the diagnosis of influenza from Nasopharyngeal swab specimens and should not be used as a sole basis for treatment. Nasal washings and aspirates are unacceptable for Xpert Xpress SARS-CoV-2/FLU/RSV testing.  Fact Sheet for Patients: BloggerCourse.com  Fact Sheet for Healthcare Providers: SeriousBroker.it  This test is not yet approved or cleared by the [place] FDA and has been  authorized for detection and/or diagnosis of SARS-CoV-2 by FDA under an Emergency Use Authorization (EUA). This EUA will remain in effect (meaning this  test can be used) for the duration of the COVID-19 declaration under Section 564(b)(1) of the Act, 21 U.S.C. section 360bbb-3(b)(1), unless the authorization is terminated or revoked.  Performed at [facility], [address], [place], [place] [address]       Radiology Studies: DG Chest 1 View  Result Date: 06/30/2020 CLINICAL DATA:  Altered level of consciousness, hypotension EXAM: CHEST  1 VIEW COMPARISON:  06/13/2016 FINDINGS: Single frontal view of the chest demonstrates an enlarged cardiac silhouette. There is right middle lobe consolidation compatible with airspace disease. Pleural effusion left lateral costophrenic angle. Diffuse interstitial prominence compatible with chronic scarring. There is increased central vascular congestion. No pneumothorax. IMPRESSION: 1. Right middle lobe consolidation most compatible with pneumonia. 2. Small left pleural effusion. 3. Central vascular congestion. Electronically Signed   By: Sharlet Salina M.D.   On: 06/30/2020 16:30   CT Head Wo Contrast  Result Date: 06/30/2020 CLINICAL DATA:  Altered mental status. EXAM: CT HEAD WITHOUT CONTRAST TECHNIQUE: Contiguous axial images were obtained from the base of the skull through the vertex without intravenous contrast. COMPARISON:  CT head dated 02/03/2016. FINDINGS: Brain: No evidence of acute infarction, hemorrhage, hydrocephalus, extra-axial collection or mass lesion/mass effect. There is mild cerebral volume loss with associated ex vacuo dilatation. Periventricular white matter hypoattenuation likely represents chronic small vessel ischemic disease. Vascular: No hyperdense vessel or unexpected calcification. Skull: Normal. Negative for fracture or focal lesion. Sinuses/Orbits: Bilateral mastoid effusions, right greater than left, are noted. The  paranasal sinuses are clear. The orbits appear normal. Other: None. IMPRESSION: 1. No acute intracranial process. 2. Bilateral mastoid effusions. Electronically Signed   By: Romona Curls M.D.   On: 06/30/2020 13:21   ECHOCARDIOGRAM COMPLETE  Result Date: 07/01/2020    ECHOCARDIOGRAM REPORT   Patient Name:   [facility] Date of Exam: 07/01/2020 Medical Rec #:  [id number]       Height:       62.0 in Accession #:    [id number]      Weight:       239.2 lb Date of Birth:  12-15-1955       BSA:          2.063 m Patient Age:    65 years        BP:           141/52 mmHg Patient Gender: F               HR:           74 bpm. Exam Location:  Inpatient Procedure: 2D Echo, Cardiac Doppler and Color Doppler Indications:    I50.40* Unspecified combined systolic (congestive) and diastolic                 (congestive) heart failure  History:        Patient has no prior history of Echocardiogram examinations.                 COPD, Signs/Symptoms:Alzheimer's, Altered Mental Status, Dyspnea                 and Shortness of Breath; Risk Factors:Diabetes.  Sonographer:    Sheralyn Boatman RDCS Referring Phys: [id number] [facility]  Sonographer Comments: Technically difficult study due to poor echo windows and patient is morbidly obese. Image acquisition challenging due to patient body habitus. Study delayed to put patient on bipap. Oxygen stats were 71 when I entered the room. Patient grabbed my probe during  exam, but I was able to finish study. Patient could not follow directions. IMPRESSIONS  1. Left ventricular ejection fraction, by estimation, is 65 to 70%. The left ventricle has normal function. The left ventricle has no regional wall motion abnormalities. There is mild left ventricular hypertrophy. Left ventricular diastolic parameters are indeterminate.  2. Right ventricular systolic function is normal. The right ventricular size is normal. Tricuspid regurgitation signal is inadequate for assessing PA pressure.  3. The mitral valve  is normal in structure. Trivial mitral valve regurgitation.  4. The aortic valve was not well visualized. Aortic valve regurgitation is not visualized. No aortic stenosis is present.  5. The inferior vena cava is normal in size with greater than 50% respiratory variability, suggesting right atrial pressure of 3 mmHg. FINDINGS  Left Ventricle: Left ventricular ejection fraction, by estimation, is 65 to 70%. The left ventricle has normal function. The left ventricle has no regional wall motion abnormalities. The left ventricular internal cavity size was small. There is mild left ventricular hypertrophy. Left ventricular diastolic parameters are indeterminate. Right Ventricle: The right ventricular size is normal. No increase in right ventricular wall thickness. Right ventricular systolic function is normal. Tricuspid regurgitation signal is inadequate for assessing PA pressure. Left Atrium: Left atrial size was normal in size. Right Atrium: Right atrial size was normal in size. Pericardium: There is no evidence of pericardial effusion. Mitral Valve: The mitral valve is normal in structure. Trivial mitral valve regurgitation. Tricuspid Valve: The tricuspid valve is normal in structure. Tricuspid valve regurgitation is not demonstrated. Aortic Valve: The aortic valve was not well visualized. Aortic valve regurgitation is not visualized. No aortic stenosis is present. Pulmonic Valve: The pulmonic valve was not well visualized. Pulmonic valve regurgitation is not visualized. Aorta: The aortic root and ascending aorta are structurally normal, with no evidence of dilitation. Venous: The inferior vena cava is normal in size with greater than 50% respiratory variability, suggesting right atrial pressure of 3 mmHg. IAS/Shunts: The interatrial septum was not well visualized.  LEFT VENTRICLE PLAX 2D LVIDd:         3.30 cm     Diastology LVIDs:         2.20 cm     LV e' medial:    5.33 cm/s LV PW:         1.20 cm     LV E/e'  medial:  19.5 LV IVS:        1.50 cm     LV e' lateral:   8.05 cm/s LVOT diam:     1.70 cm     LV E/e' lateral: 12.9 LV SV:         70 LV SV Index:   34 LVOT Area:     2.27 cm  LV Volumes (MOD) LV vol d, MOD A2C: 78.5 ml LV vol d, MOD A4C: 57.6 ml LV vol s, MOD A2C: 31.2 ml LV vol s, MOD A4C: 14.8 ml LV SV MOD A2C:     47.3 ml LV SV MOD A4C:     57.6 ml LV SV MOD BP:      44.2 ml RIGHT VENTRICLE             IVC RV S prime:     16.30 cm/s  IVC diam: 1.90 cm TAPSE (M-mode): 2.6 cm LEFT ATRIUM             Index       RIGHT ATRIUM  Index LA diam:        3.80 cm 1.84 cm/m  RA Area:     12.90 cm LA Vol (A2C):   25.4 ml 12.31 ml/m RA Volume:   31.00 ml  15.03 ml/m LA Vol (A4C):   21.5 ml 10.42 ml/m LA Biplane Vol: 24.5 ml 11.88 ml/m  AORTIC VALVE LVOT Vmax:   147.00 cm/s LVOT Vmean:  95.400 cm/s LVOT VTI:    0.308 m  AORTA Ao Root diam: 2.80 cm Ao Asc diam:  3.20 cm MITRAL VALVE MV Area (PHT): 4.46 cm     SHUNTS MV Decel Time: 170 msec     Systemic VTI:  0.31 m MV E velocity: 104.00 cm/s  Systemic Diam: 1.70 cm MV A velocity: 110.00 cm/s MV E/A ratio:  0.95 Epifanio Lesches MD Electronically signed by Epifanio Lesches MD Signature Date/Time: 07/01/2020/3:20:57 PM    Final        LOS: 2 days   Osvaldo Shipper  [facility] Pager on [email or web address]  07/02/2020, 9:57 AM

## 2020-07-02 NOTE — Progress Notes (Addendum)
Pt when into Afib RVR Provider Zierle-Gosh Asia B,DO was notified EKG showed that the pt was in Afib RVR, PT Receive 2 doses of Lopressor  One at 0031 there was no improvement with hr, Pt receive another at 0224 pt, BP drop to 66/57 . Provider was notified via message and ask to come the beside in the message and there was no responds. RR was notified and was present at bedside to assess pt, ordered to in increase pt bolus. PT BP has now improved 135/79,  bolus continues and will continue to monitor pt.Still  Waiting provider responds .

## 2020-07-02 NOTE — Progress Notes (Signed)
Modified Barium Swallow Progress Note  Patient Details  Name: Stephanie Rice MRN: [id number] Date of Birth: 23-Nov-1955  Today's Date: 07/02/2020  Modified Barium Swallow completed.  Full report located under Chart Review in the Imaging Section.  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 This limited epiglottic inversion and reduced airway protection. Airway protection was inconsistently achieved by approximation of the arytenoids to the laryngeal surface of the epiglottis. Oropharyngeal dysphagia was noted characterized by reduced bolus cohesion, reduced lingual retraction, reduced pharyngeal constriction, a pharyngeal delay, reduced hyolaryngeal elevation, and reduced anterior laryngeal movement. Pt demonstrated premature spillage to the valleculae and pyriform sinuses, vallecular residue, posterior pharyngeal wall residue, penetration (PAS 3) of nectar thick liquids and aspiration (PAS 7,8) of thin and nectar thick liquids. Laryngeal invasion was due to impairments in timing of the swallow and due to impairments in timing. Aspiration inconsistently resulted in coughing, but a wet vocal quality was observed following other some other instances of aspiration. Pt was unable to follow the commands necessary for implementation of any compensatory strategies. Pt was prompted to cough, but was unable to follow this command and began laughing thereafter. Pt independently used secondary swallows which reduced pharyngeal residue to a functional level. Reduced bolus sizes reduced, but did not eliminate, laryngeal invasion. A dysphagia 2 diet with honey thick liquids is recommended at this time. SLP will follow for treatment.   Swallow Evaluation Recommendations       SLP Diet  Recommendations: Dysphagia 2 (Fine chop) solids;Honey thick liquids   Liquid Administration via: Cup;Straw   Medication Administration: Crushed with puree   Supervision: Full assist for feeding   Compensations: Small sips/bites;Slow rate;Multiple dry swallows after each bite/sip   Postural Changes: Seated upright at 90 degrees   Oral Care Recommendations: Oral care BID   Other Recommendations: Order thickener from pharmacy   Lawrence I. Vear Clock, MS, CCC-SLP Acute [facility] Services Office number [phone] Pager [phone]  [name] 07/02/2020,2:03 PM

## 2020-07-02 NOTE — Progress Notes (Signed)
Physical Therapy Treatment Patient Details Name: Stephanie Rice MRN: [id number] DOB: [birth year] Today's Date: 07/02/2020    History of Present Illness Stephanie Rice is a 65 y.o. female with medical history significant of chronic hypoxic and hypercapnic respiratory failure secondary COPD, on Trelegy PRN +2 L PRN, HTN, recent diagnosed DVT, morbid obesity, IDDM, Dementia, bipolar disorder, presented with altered mentation.    PT Comments    Patient more cooperative today. Able to sit at EOB with 1 person assist, however not able to transfer or even laterally scoot with 1 person assist (she does not initiate movement even with manual facilitation). Noted TOC note with pt having been in SNF x 2 weeks only (having been hospitalized since 05/08/20).     Follow Up Recommendations  SNF;Supervision/Assistance - 24 hour     Equipment Recommendations  None recommended by PT;Other (comment) (TBD)    Recommendations for Other Services       Precautions / Restrictions Precautions Precautions: Fall Precaution Comments: monitor O2    Mobility  Bed Mobility Overal bed mobility: Needs Assistance Bed Mobility: Rolling;Sidelying to Sit;Sit to Sidelying Rolling: Min assist Sidelying to sit: Mod assist     Sit to sidelying: Mod assist General bed mobility comments: assist to move legs over EOB (and later raise up onto bed); assist to raise torso    Transfers                 General transfer comment: attempted lateral scoot with pt not participating/putting forth no effort  Ambulation/Gait             General Gait Details: unable due to cognition   Stairs             Wheelchair Mobility    Modified Rankin (Stroke Patients Only)       Balance Overall balance assessment: Needs assistance Sitting-balance support: No upper extremity supported;Feet supported Sitting balance-Leahy Scale: Poor Sitting balance - Comments: spontaneous lossess of balance posteriorly  with assist to recover Postural control: Posterior lean                                  Cognition Arousal/Alertness: Awake/alert Behavior During Therapy: WFL for tasks assessed/performed Overall Cognitive Status: No family/caregiver present to determine baseline cognitive functioning Area of Impairment: Orientation;Attention;Following commands;Safety/judgement;Awareness;Problem solving                 Orientation Level: Disoriented to;Place;Time;Situation Current Attention Level: Focused   Following Commands: Follows one step commands inconsistently Safety/Judgement: Decreased awareness of safety;Decreased awareness of deficits Awareness: Intellectual Problem Solving: Slow processing;Difficulty sequencing;Requires verbal cues;Requires tactile cues General Comments: Asked her DOB and repeated stating 1/12 but not year. Even with prompt "19..." she continued to state 1/[birth year]. No spontaneous speech and not responding to all commands      Exercises      General Comments        Pertinent Vitals/Pain Pain Assessment: Faces Faces Pain Scale: No hurt    Home Living                      Prior Function            PT Goals (current goals can now be found in the care plan section) Acute Rehab PT Goals Patient Stated Goal: unable to state PT Goal Formulation: Patient unable to participate in goal setting Time For Goal Achievement: 07/15/20 Progress towards PT goals:  Progressing toward goals    Frequency    Min 2X/week      PT Plan Current plan remains appropriate    Co-evaluation              AM-PAC PT "6 Clicks" Mobility   Outcome Measure  Help needed turning from your back to your side while in a flat bed without using bedrails?: A Lot Help needed moving from lying on your back to sitting on the side of a flat bed without using bedrails?: A Lot Help needed moving to and from a bed to a chair (including a wheelchair)?: Total Help  needed standing up from a chair using your arms (e.g., wheelchair or bedside chair)?: Total Help needed to walk in hospital room?: Total Help needed climbing 3-5 steps with a railing? : Total 6 Click Score: 8    End of Session Equipment Utilized During Treatment: Oxygen Activity Tolerance: Other (comment) (limited by cognition.) Patient left: in bed;with nursing/sitter in room (NT in to clean/bathe pt; she reported she would replace mitts when she is done) Nurse Communication: Mobility status PT Visit Diagnosis: Other abnormalities of gait and mobility (R26.89)     Time: 1045-1106 PT Time Calculation (min) (ACUTE ONLY): 21 min  Charges:  $Therapeutic Activity: 8-22 mins                      Jerolyn Center, PT Pager [phone]    Zena Amos 07/02/2020, 12:17 PM

## 2020-07-02 NOTE — TOC Progression Note (Addendum)
Transition of Care [facility]) - Progression Note    Patient Details  Name: Stephanie Rice MRN: [id number] Date of Birth: July 01, 1955  [facility] CM/SW Contact  [name], LCSW Phone Number: 07/02/2020, 2:31 PM  Clinical Narrative:  CSW spoke to [place] at [facility].  They cannot take pt back if she is on trilogy.  They can take her back on bipap.     CSW spoke with daughter who said pt trilogy is at home and has been authorized by insurance for another year.    Expected Discharge Plan: Skilled Nursing Facility Barriers to Discharge: Continued Medical Work up  Expected Discharge Plan and Services Expected Discharge Plan: Skilled Nursing Facility     Post Acute Care Choice:  (daughter requesting LTAC) Living arrangements for the past 2 months: Skilled Nursing Facility (was hospitalized at [place] for 30+ days prior to SNF)                                       Social Determinants of Health (SDOH) Interventions    Readmission Risk Interventions No flowsheet data found.

## 2020-07-02 NOTE — Progress Notes (Signed)
PROVIDER RESPOND ORDER CARDIZEM AND A 250 ML/HR OF 500 ML BOLUS.

## 2020-07-02 NOTE — Progress Notes (Signed)
Pt requiring mitten restraints at this time.  BiPAP removed and pt placed on 15 l/m HFNC.  O2 saturation is 100% at this time.  RT will continue to monitor.

## 2020-07-02 NOTE — Progress Notes (Signed)
  Speech Language Pathology Treatment: Dysphagia  Patient Details Name: Stephanie Rice MRN: [id number] DOB: [birth year] Today's Date: 07/02/2020 Time: 1008-     Assessment / Plan / Recommendation Clinical Impression  Pt was seen for dysphagia treatment. She was alert and cooperative during the session, but still demonstrated confusion. She exhibited coughing with consecutive swallows of thin liquids via straw and following multiple boluses of puree. Coughing was reduced with thin liquids when individual swallows were used, but still noted and aspiration is suspected. No significant oral residue was noted, but pharyngeal residue is suspected due to her becoming symptomatic following multiple boluses. A modified barium swallow study is still clinically indicated to further assess swallow function and it is currently scheduled for today at 1230.    HPI HPI: Pt is a 65 y.o. female with medical history significant of chronic hypoxic and hypercapnic respiratory failure secondary COPD, on Trelegy PRN +2 L PRN, HTN, recent lydiagnosed DVT, morbid obesity, IDDM, Dementia, bipolar disorder, who presented with altered mentation. CT head was negative. CXR 2/15: Right middle lobe consolidation most compatible with pneumonia. MD considered this to be residue imaging changes from a PNA pt had last month, vs a new infection and coverage was expanded for aspiration. Per EMR, pt's daughter has reported that pt has chronic dysphagia on pureed diet, with strict instruction of sitting up 30 min before feed.      SLP Plan  MBS today at 1230       Recommendations  Diet recommendations:  (Deferred until completion of MBS) Medication Administration: Crushed with puree                Oral Care Recommendations: Oral care QID Follow up Recommendations:  (TBD) SLP Visit Diagnosis: Dysphagia, unspecified (R13.10) Plan: MBS       Stephanie Rice I. Vear Clock, MS, CCC-SLP Acute Rehabilitation Services Office number  [phone] Pager [phone]                [name] 07/02/2020, 10:19 AM

## 2020-07-03 DIAGNOSIS — F0391 Unspecified dementia with behavioral disturbance: Secondary | ICD-10-CM | POA: Diagnosis not present

## 2020-07-03 DIAGNOSIS — J441 Chronic obstructive pulmonary disease with (acute) exacerbation: Secondary | ICD-10-CM | POA: Diagnosis not present

## 2020-07-03 DIAGNOSIS — E1165 Type 2 diabetes mellitus with hyperglycemia: Secondary | ICD-10-CM | POA: Diagnosis not present

## 2020-07-03 DIAGNOSIS — F3160 Bipolar disorder, current episode mixed, unspecified: Secondary | ICD-10-CM | POA: Diagnosis not present

## 2020-07-03 LAB — BASIC METABOLIC PANEL
Anion gap: 12 (ref 5–15)
BUN: 25 mg/dL — ABNORMAL HIGH (ref 8–23)
CO2: 34 mmol/L — ABNORMAL HIGH (ref 22–32)
Calcium: 8.6 mg/dL — ABNORMAL LOW (ref 8.9–10.3)
Chloride: 97 mmol/L — ABNORMAL LOW (ref 98–111)
Creatinine, Ser: 0.41 mg/dL — ABNORMAL LOW (ref 0.44–1.00)
GFR, Estimated: >60 mL/min (ref >60)
Glucose, Bld: 170 mg/dL — ABNORMAL HIGH (ref 70–99)
Potassium: 4.4 mmol/L (ref 3.5–5.1)
Sodium: 143 mmol/L (ref 135–145)

## 2020-07-03 LAB — CBC
HCT: 39.9 % (ref 36.0–46.0)
Hemoglobin: 12 g/dL (ref 12.0–15.0)
MCH: 30.8 pg (ref 26.0–34.0)
MCHC: 30.1 g/dL (ref 30.0–36.0)
MCV: 102.3 fL — ABNORMAL HIGH (ref 80.0–100.0)
Platelets: 208 10*3/uL (ref 150–400)
RBC: 3.9 MIL/uL (ref 3.87–5.11)
RDW: 14.9 % (ref 11.5–15.5)
WBC: 4.5 10*3/uL (ref 4.0–10.5)
nRBC: 0 % (ref 0.0–0.2)

## 2020-07-03 LAB — GLUCOSE, CAPILLARY
Glucose-Capillary: 220 mg/dL — ABNORMAL HIGH (ref 70–99)
Glucose-Capillary: 172 mg/dL — ABNORMAL HIGH (ref 70–99)
Glucose-Capillary: 219 mg/dL — ABNORMAL HIGH (ref 70–99)
Glucose-Capillary: 303 mg/dL — ABNORMAL HIGH (ref 70–99)

## 2020-07-03 MED ORDER — CEPHALEXIN 500 MG PO CAPS
500.0000 mg | ORAL_CAPSULE | Freq: Three times a day (TID) | ORAL | Status: AC
  Administered 2020-07-03 – 2020-07-07 (×12): 500 mg via ORAL
  Filled 2020-07-03 (×12): qty 1

## 2020-07-03 MED ORDER — SODIUM CHLORIDE 0.9 % IV BOLUS: 500.0000 mL | Freq: Once | INTRAVENOUS | Status: DC

## 2020-07-03 MED ORDER — CLONIDINE HCL 0.1 MG PO TABS
0.1000 mg | ORAL_TABLET | Freq: Two times a day (BID) | ORAL | Status: DC
Start: 2020-07-03 — End: 2020-07-05
  Administered 2020-07-03 – 2020-07-05 (×5): 0.1 mg via ORAL
  Filled 2020-07-03 (×5): qty 1

## 2020-07-03 MED ORDER — RESOURCE THICKENUP CLEAR PO POWD
ORAL | Status: DC | PRN
  Filled 2020-07-03 (×5): qty 125

## 2020-07-03 NOTE — Progress Notes (Addendum)
  Speech Language Pathology Treatment: Dysphagia  Patient Details Name: Stephanie Rice MRN: [id number] DOB: [birth year] Today's Date: 07/03/2020 Time: 0945-1000 SLP Time Calculation (min) (ACUTE ONLY): 15 min  Assessment / Plan / Recommendation Clinical Impression  Pt was seen for dysphagia treatment. She was alert and cooperative during the session. Pt was rubbing her cheek with her nasal cannula upon SLP's arrival and it was replaced by RN. Pt's RN, Bella Kennedy, reported that the pt has been tolerating the current diet without overt s/sx of aspiration, but has required cueing and physical support to reduce bolus size. She tolerated dysphagia solids and honey thick liquids via straw without overt s/sx of aspiration. Mastication was [facility] and no significant oral residue was noted. Pt exhibited coughing once with puree when a liquid wash was not used with two full-tsp boluses. Pt's daughter was contacted following the session and reported that the pt has been on puree solids and honey thick liquids after three swallow studies in [place] and then discharged to [facility]. Pt's daughter was educated regarding the results of the modified barium swallow study and recommendations. She was pleased that her mother's swallow function appears to have improved since the last swallow study. She was advised of the diet upgrade to dysphagia 2 compared to her baseline and was in agreement with the advancement stating, "I don't want to hinder her progress". She did voice some concerns regarding straws and scrambled eggs due to pt's difficulty with these in the past, but stated that she was comfortable since the pt will have full supervision and assistance with meals. A dysphagia 2 diet with honey thick liquid is still recommended with strict observance of swallowing precautions and SLP will continue to follow pt.    HPI HPI: Pt is a 65 y.o. female with medical history significant of chronic hypoxic and hypercapnic  respiratory failure secondary COPD, on Trelegy PRN +2 L PRN, HTN, recent lydiagnosed DVT, morbid obesity, IDDM, Dementia, bipolar disorder, who presented with altered mentation. CT head was negative. CXR 2/15: Right middle lobe consolidation most compatible with pneumonia. MD considered this to be residue imaging changes from a PNA pt had last month, vs a new infection and coverage was expanded for aspiration. Per EMR, pt's daughter has reported that pt has chronic dysphagia on pureed diet, with strict instruction of sitting up 30 min before feed.      SLP Plan  Continue with current plan of care       Recommendations  Diet recommendations: Dysphagia 2 (fine chop);Honey-thick liquid Liquids provided via: Cup;Straw Medication Administration: Crushed with puree Supervision: Full supervision/cueing for compensatory strategies;Staff to assist with self feeding Compensations: Small sips/bites (avoid consecutive swallows);Slow rate;Multiple dry swallows after each bite/sip Postural Changes and/or Swallow Maneuvers: Seated upright 90 degrees                Oral Care Recommendations: Oral care QID Follow up Recommendations: Skilled Nursing facility SLP Visit Diagnosis: Dysphagia, unspecified (R13.10) Plan: Continue with current plan of care       Briar Witherspoon I. Vear Clock, MS, CCC-SLP Acute Rehabilitation Services Office number [phone] Pager [phone]                 [name] 07/03/2020, 10:33 AM

## 2020-07-03 NOTE — Plan of Care (Signed)

## 2020-07-03 NOTE — Progress Notes (Signed)
[facility] PROGRESS NOTE   Shalla Bulluck BWG:[id number] DOB: 06-11-1955 DOA: 06/30/2020  PCP: [name], PA-C  Brief History/Interval Summary: 65 y.o. female with medical history significant of chronic hypoxic and hypercapnic respiratory failure secondary COPD, on Trelegy PRN +2 L PRN, HTN, recent diagnosed DVT, morbid obesity, IDDM, Dementia, bipolar disorder, presented with altered mentation.  Patient was found to have hypercapnic respiratory failure.  Was hospitalized for further management.  Consultants: None  Procedures: None yet  Antibiotics: Anti-infectives (From admission, onward)   Start     Dose/Rate Route Frequency Ordered Stop   06/30/20 2200  doxycycline (VIBRA-TABS) tablet 100 mg        100 mg Oral Every 12 hours 06/30/20 1612 07/05/20 2159   06/30/20 2030  cefTRIAXone (ROCEPHIN) 2 g in sodium chloride 0.9 % 100 mL IVPB        2 g 200 mL/hr over 30 Minutes Intravenous Daily at bedtime 06/30/20 2006     06/30/20 1800  Ampicillin-Sulbactam (UNASYN) 3 g in sodium chloride 0.9 % 100 mL IVPB  Status:  Discontinued        3 g 200 mL/hr over 30 Minutes Intravenous Every 6 hours 06/30/20 1703 06/30/20 1954      Subjective/Interval History: Patient remains pleasantly confused.  Denies any pain in the chest area or in the abdomen.     Assessment/Plan:  Acute metabolic encephalopathy Likely secondary to hypercapnia along with the urinary tract infection.  No focal deficits noted.  CT head was reassuring.  Patient with known history of bipolar disorder and dementia.  Mentation could be back to her baseline.  Continue with aspiration precautions.  PT and OT evaluation.  Mobilize.    Acute COPD exacerbation with acute on chronic hypoxic and hypercapnic respiratory failure/community-acquired pneumonia Patient was placed on BiPAP.  Respiratory status appears to be stable.   Continue BiPAP every night during daytime hours if she looks somnolent.  Patient  apparently on trilogy vent prior to admission. Continue with steroids nebulizer treatments and doxycycline.   ABG did show improvement in pH.  Influenza and COVID-19 test were negative.    Atrial fibrillation with RVR Patient went into rapid atrial fibrillation on the morning of 2/17.  Was given Cardizem and metoprolol.  Converted back to sinus rhythm.  Was placed on metoprolol.  Seems to be stable.  No alarms noted on telemetry.   Echocardiogram shows normal systolic function.  Mild LVH was noted.  No significant valvular abnormalities noted.   TSH noted to be normal.  Troponins were normal.  Patient already on Eliquis for history of DVT.  Urinary tract infection/urinary retention Continue ceftriaxone.  Urine culture with multiple species  She did retain urine day before yesterday.  However seems to be voiding by herself the last 24 hours.  Continue bladder scans  Mild hyperkalemia Potassium noted to be normal today.  She was given [facility] yesterday.  Continue to monitor periodically   Essential hypertension Due to low blood pressures clonidine was discontinued.  Patient to be placed on metoprolol for A. fib.  Blood pressure now in the hypertensive range.  Could be experiencing some rebound hypertension.  Will be placed back on her clonidine.   Diabetes mellitus type 2, on insulin, uncontrolled with hyperglycemia HbA1c 7.2.  Patient remains on Levemir and SSI.    Diabetic neuropathy Noted to be on gabapentin.  History of bipolar disorder Patient on valproic acid and loxapine.  Medication reconciliation was done yesterday and apparently patient  has not been on Topamax which was discontinued.  Not on gabapentin either which was also discontinued.  Dose of melatonin was decreased.  Steroids could be contributing to her occasional agitation.  History of DVT Continue Eliquis.  Morbid obesity Estimated body mass index is 43.75 kg/m as calculated from the following:   Height as of  02/04/16:  (1.575 m).   Weight as of 02/04/16: 108.5 kg.    DVT Prophylaxis: On Eliquis Code Status: DNR Family Communication: We will update daughter. Disposition Plan: Patient will need to go to SNF when medically improved.  However the nursing facility from where she came does not take patients on trilogy vent.  Will discuss with pulmonology and with daughter.  May need to use BiPAP if no other facility is willing to take her with the trilogy ventilator.  Status is: Inpatient  Remains inpatient appropriate because:Altered mental status, IV treatments appropriate due to intensity of illness or inability to take PO and Inpatient level of care appropriate due to severity of illness   Dispo: The patient is from: SNF              Anticipated d/c is to: SNF              Anticipated d/c date is: 3 days              Patient currently is not medically stable to d/c.   Difficult to place patient No      Medications:  Scheduled: . apixaban  5 mg Oral BID  . atorvastatin  20 mg Oral QHS  . doxycycline  100 mg Oral Q12H  . fluticasone furoate-vilanterol  1 puff Inhalation Daily  . insulin aspart  0-15 Units Subcutaneous TID WC  . insulin aspart  0-5 Units Subcutaneous QHS  . insulin detemir  20 Units Subcutaneous Daily  . loxapine  20 mg Oral QHS  . melatonin  5 mg Oral QHS  . metoprolol tartrate  12.5 mg Oral BID  . pantoprazole  40 mg Oral Daily  . polyvinyl alcohol  1 drop Both Eyes Q breakfast  . predniSONE  40 mg Oral Q breakfast  . valproic acid  250 mg Oral QID   Continuous: . cefTRIAXone (ROCEPHIN)  IV Stopped (07/03/20 1610)   RUE:[id number], haloperidol lactate, hydrALAZINE, Resource ThickenUp Clear   Objective:  Vital Signs  Vitals:   07/03/20 0530 07/03/20 0645 07/03/20 0727 07/03/20 0848  BP: (!) 174/70 (!) 169/82 (!) 170/70   Pulse: 62 83 78   Resp:   19   Temp:   97.9 F (36.6 C)   TempSrc:   Axillary   SpO2: 99% 95%  100%    Intake/Output Summary  (Last 24 hours) at 07/03/2020 0945 Last data filed at 07/03/2020 0900 Gross per 24 hour  Intake 360 ml  Output --  Net 360 ml   There were no vitals filed for this visit.   General appearance: Awake alert.  In no distress.  Distracted.  Pleasantly confused. Resp: Improved effort.  Decreased air entry at the bases.  No wheezing heard today. Cardio: S1-S2 is normal regular.  No S3-S4.  No rubs murmurs or bruit.  Telemetry shows sinus rhythm GI: Abdomen is soft.  Nontender nondistended.  Bowel sounds are present normal.  No masses organomegaly Extremities: No edema.  She is moving all of her extremities Neurologic: disoriented but no focal deficits.  No focal neurological deficits.       Lab Results:  Data Reviewed: I have personally reviewed following labs and imaging studies  CBC: Recent Labs  Lab 06/30/20 1226 06/30/20 1516 07/01/20 0956 07/02/20 0220 07/03/20 0703  WBC 4.0  --  3.3* 5.1 4.5  NEUTROABS 1.8  --   --   --   --   HGB 12.0 13.3 12.2 12.6 12.0  HCT 43.1 39.0 40.4 41.9 39.9  MCV 105.9*  --  101.8* 101.5* 102.3*  PLT 209  --  207 208 208    Basic Metabolic Panel: Recent Labs  Lab 06/30/20 1226 06/30/20 1516 07/01/20 0956 07/02/20 0220 07/03/20 0425  NA 138 138 143 141 143  K 5.4* 5.4* 5.6* 5.4* 4.4  CL 93*  --  98 97* 97*  CO2 36*  --  34* 33* 34*  GLUCOSE 131*  --  254* 215* 170*  BUN 34*  --  22 22 25*  CREATININE 0.79  --  0.59 0.50 0.41*  CALCIUM 7.9*  --  8.4* 8.5* 8.6*  MG  --   --   --  2.0  --     GFR: CrCl cannot be calculated (Unknown ideal weight.).  Liver Function Tests: Recent Labs  Lab 06/30/20 1226  AST 15  ALT 14  ALKPHOS 64  BILITOT 0.5  PROT 6.0*  ALBUMIN 2.4*    Recent Labs  Lab 06/30/20 1226  LIPASE 19    HbA1C: Recent Labs    06/30/20 1614  HGBA1C 7.2*    CBG: Recent Labs  Lab 07/02/20 0741 07/02/20 1201 07/02/20 1652 07/02/20 2146 07/03/20 0730  GLUCAP 188* 201* 208* 154* 220*       Recent Results (from the past 240 hour(s))  Resp Panel by RT-PCR (Flu A&B, Covid) Nasopharyngeal Swab     Status: None   Collection Time: 06/30/20  3:57 PM   Specimen: Nasopharyngeal Swab; Nasopharyngeal(NP) swabs in vial transport medium  Result Value Ref Range Status   SARS Coronavirus 2 by RT PCR NEGATIVE NEGATIVE Final    Comment: (NOTE) SARS-CoV-2 target nucleic acids are NOT DETECTED.  The SARS-CoV-2 RNA is generally detectable in upper respiratory specimens during the acute phase of infection. The lowest concentration of SARS-CoV-2 viral copies this assay can detect is 138 copies/mL. A negative result does not preclude SARS-Cov-2 infection and should not be used as the sole basis for treatment or other patient management decisions. A negative result may occur with  improper specimen collection/handling, submission of specimen other than nasopharyngeal swab, presence of viral mutation(s) within the areas targeted by this assay, and inadequate number of viral copies(<138 copies/mL). A negative result must be combined with clinical observations, patient history, and epidemiological information. The expected result is Negative.  Fact Sheet for Patients:  BloggerCourse.comhttps://www.fda.gov/media/152166/download  Fact Sheet for Healthcare Providers:  SeriousBroker.ithttps://www.fda.gov/media/152162/download  This test is no t yet approved or cleared by the [place] FDA and  has been authorized for detection and/or diagnosis of SARS-CoV-2 by FDA under an Emergency Use Authorization (EUA). This EUA will remain  in effect (meaning this test can be used) for the duration of the COVID-19 declaration under Section 564(b)(1) of the Act, 21 U.S.C.section 360bbb-3(b)(1), unless the authorization is terminated  or revoked sooner.       Influenza A by PCR NEGATIVE NEGATIVE Final   Influenza B by PCR NEGATIVE NEGATIVE Final    Comment: (NOTE) The Xpert Xpress SARS-CoV-2/FLU/RSV plus assay is intended  as an aid in the diagnosis of influenza from Nasopharyngeal swab specimens and should not  be used as a sole basis for treatment. Nasal washings and aspirates are unacceptable for Xpert Xpress SARS-CoV-2/FLU/RSV testing.  Fact Sheet for Patients: BloggerCourse.com  Fact Sheet for Healthcare Providers: SeriousBroker.it  This test is not yet approved or cleared by the [place] FDA and has been authorized for detection and/or diagnosis of SARS-CoV-2 by FDA under an Emergency Use Authorization (EUA). This EUA will remain in effect (meaning this test can be used) for the duration of the COVID-19 declaration under Section 564(b)(1) of the Act, 21 U.S.C. section 360bbb-3(b)(1), unless the authorization is terminated or revoked.  Performed at [facility], [address], [place], [place] [address]       Radiology Studies: DG Swallowing Func-Speech Pathology  Result Date: 07/02/2020 Objective Swallowing Evaluation: Type of Study: MBS-Modified Barium Swallow Study  Patient Details Name: Maribeth Jiles MRN: [id number] Date of Birth: 1955-12-20 Today's Date: 07/02/2020 Time: SLP Start Time (ACUTE ONLY): 1235 -SLP Stop Time (ACUTE ONLY): 1250 SLP Time Calculation (min) (ACUTE ONLY): 15 min Past Medical History: Past Medical History: Diagnosis Date . Arthritis  . Barrett esophagus  . Bipolar 1 disorder (HCC)  . COPD (chronic obstructive pulmonary disease) (HCC)  . Dementia (HCC)  . Diabetes mellitus without complication (HCC)  . GERD (gastroesophageal reflux disease)  Past Surgical History: Past Surgical History: Procedure Laterality Date . BREAST CYST EXCISION   . BREAST SURGERY   HPI: Pt is a 65 y.o. female with medical history significant of chronic hypoxic and hypercapnic respiratory failure secondary COPD, on Trelegy PRN +2 L PRN, HTN, recent lydiagnosed DVT, morbid obesity, IDDM, Dementia, bipolar disorder, who presented with altered  mentation. CT head was negative. CXR 2/15: Right middle lobe consolidation most compatible with pneumonia. MD considered this to be residue imaging changes from a PNA pt had last month, vs a new infection and coverage was expanded for aspiration. Per EMR, pt's daughter has reported that pt has chronic dysphagia on pureed diet, with strict instruction of sitting up 30 min before feed.  No data recorded Assessment / Plan / Recommendation CHL IP CLINICAL IMPRESSIONS 07/02/2020 Clinical Impression Pt presented with advanced degenerative changes in the cervical spine with bulky anterior endplate osteophytosis (as was noted and described by radiologist following maxillofacial CT on 02/03/16). This limited epiglottic inversion and reduced airway protection. Airway protection was inconsistently achieved by approximation of the arytenoids to the laryngeal surface of the epiglottis. Oropharyngeal dysphagia was noted characterized by reduced bolus cohesion, reduced lingual retraction, reduced pharyngeal constriction, a pharyngeal delay, reduced hyolaryngeal elevation, and reduced anterior laryngeal movement. Pt demonstrated premature spillage to the valleculae and pyriform sinuses, vallecular residue, posterior pharyngeal wall residue, penetration (PAS 3) of nectar thick liquids and aspiration (PAS 7,8) of thin and nectar thick liquids. Laryngeal invasion was due to impairments in timing of the swallow and due to impairments in timing. Aspiration inconsistently resulted in coughing, but a wet vocal quality was observed following other some other instances of aspiration. Pt was unable to follow the commands necessary for implementation of any compensatory strategies. Pt was prompted to cough, but was unable to follow this command and began laughing thereafter. Pt independently used secondary swallows which reduced pharyngeal residue to a functional level. Reduced bolus sizes reduced, but did not eliminate, laryngeal invasion. A  dysphagia 2 diet with honey thick liquids is recommended at this time. SLP will follow for treatment. SLP Visit Diagnosis Dysphagia, unspecified (R13.10) Attention and concentration deficit following -- Frontal lobe and executive function deficit  following -- Impact on safety and function Mild aspiration risk;Moderate aspiration risk   CHL IP TREATMENT RECOMMENDATION 07/02/2020 Treatment Recommendations Therapy as outlined in treatment plan below   Prognosis 07/02/2020 Prognosis for Safe Diet Advancement Good Barriers to Reach Goals Time post onset;Cognitive deficits;Severity of deficits Barriers/Prognosis Comment -- CHL IP DIET RECOMMENDATION 07/02/2020 SLP Diet Recommendations Dysphagia 2 (Fine chop) solids;Honey thick liquids Liquid Administration via Cup;Straw Medication Administration Crushed with puree Compensations Small sips/bites;Slow rate;Multiple dry swallows after each bite/sip Postural Changes Seated upright at 90 degrees   CHL IP OTHER RECOMMENDATIONS 07/02/2020 Recommended Consults -- Oral Care Recommendations Oral care BID Other Recommendations Order thickener from pharmacy   CHL IP FOLLOW UP RECOMMENDATIONS 07/02/2020 Follow up Recommendations Skilled Nursing facility   [facility] IP FREQUENCY AND DURATION 07/02/2020 Speech Therapy Frequency (ACUTE ONLY) min 2x/week Treatment Duration 2 weeks      CHL IP ORAL PHASE 07/02/2020 Oral Phase Impaired Oral - Pudding Teaspoon -- Oral - Pudding Cup -- Oral - Honey Teaspoon -- Oral - Honey Cup Decreased bolus cohesion Oral - Nectar Teaspoon -- Oral - Nectar Cup Decreased bolus cohesion Oral - Nectar Straw Decreased bolus cohesion;Premature spillage Oral - Thin Teaspoon -- Oral - Thin Cup Decreased bolus cohesion;Premature spillage Oral - Thin Straw Decreased bolus cohesion;Premature spillage Oral - Puree Decreased bolus cohesion;Premature spillage Oral - Mech Soft Decreased bolus cohesion;Impaired mastication;Premature spillage Oral - Regular -- Oral - Multi-Consistency  -- Oral - Pill -- Oral Phase - Comment --  CHL IP PHARYNGEAL PHASE 07/02/2020 Pharyngeal Phase Impaired Pharyngeal- Pudding Teaspoon -- Pharyngeal -- Pharyngeal- Pudding Cup -- Pharyngeal -- Pharyngeal- Honey Teaspoon -- Pharyngeal -- Pharyngeal- Honey Cup Pharyngeal residue - valleculae;Pharyngeal residue - pyriform;Reduced pharyngeal peristalsis;Reduced epiglottic inversion;Reduced anterior laryngeal mobility;Reduced laryngeal elevation;Reduced airway/laryngeal closure;Reduced tongue base retraction Pharyngeal -- Pharyngeal- Nectar Teaspoon -- Pharyngeal -- Pharyngeal- Nectar Cup Pharyngeal residue - valleculae;Pharyngeal residue - pyriform;Reduced pharyngeal peristalsis;Reduced epiglottic inversion;Reduced anterior laryngeal mobility;Reduced laryngeal elevation;Reduced airway/laryngeal closure;Reduced tongue base retraction;Penetration/Aspiration during swallow;Penetration/Apiration after swallow Pharyngeal Material enters airway, remains ABOVE vocal cords and not ejected out;Material enters airway, passes BELOW cords and not ejected out despite cough attempt by patient;Material enters airway, passes BELOW cords without attempt by patient to eject out (silent aspiration) Pharyngeal- Nectar Straw Pharyngeal residue - valleculae;Pharyngeal residue - pyriform;Reduced pharyngeal peristalsis;Reduced epiglottic inversion;Reduced anterior laryngeal mobility;Reduced laryngeal elevation;Reduced airway/laryngeal closure;Reduced tongue base retraction;Penetration/Aspiration during swallow;Penetration/Aspiration before swallow Pharyngeal Material enters airway, passes BELOW cords without attempt by patient to eject out (silent aspiration);Material enters airway, passes BELOW cords and not ejected out despite cough attempt by patient Pharyngeal- Thin Teaspoon -- Pharyngeal -- Pharyngeal- Thin Cup Pharyngeal residue - valleculae;Pharyngeal residue - pyriform;Reduced pharyngeal peristalsis;Reduced epiglottic inversion;Reduced  anterior laryngeal mobility;Reduced laryngeal elevation;Reduced airway/laryngeal closure;Reduced tongue base retraction;Penetration/Aspiration during swallow;Penetration/Apiration after swallow Pharyngeal Material enters airway, passes BELOW cords without attempt by patient to eject out (silent aspiration) Pharyngeal- Thin Straw Pharyngeal residue - valleculae;Pharyngeal residue - pyriform;Reduced pharyngeal peristalsis;Reduced epiglottic inversion;Reduced anterior laryngeal mobility;Reduced laryngeal elevation;Reduced airway/laryngeal closure;Reduced tongue base retraction;Penetration/Aspiration during swallow Pharyngeal Material enters airway, passes BELOW cords without attempt by patient to eject out (silent aspiration) Pharyngeal- Puree Pharyngeal residue - valleculae;Pharyngeal residue - pyriform;Reduced pharyngeal peristalsis;Reduced epiglottic inversion;Reduced anterior laryngeal mobility;Reduced laryngeal elevation;Reduced airway/laryngeal closure;Reduced tongue base retraction Pharyngeal -- Pharyngeal- Mechanical Soft Pharyngeal residue - valleculae;Pharyngeal residue - pyriform;Reduced pharyngeal peristalsis;Reduced epiglottic inversion;Reduced anterior laryngeal mobility;Reduced laryngeal elevation;Reduced airway/laryngeal closure;Reduced tongue base retraction Pharyngeal -- Pharyngeal- Regular -- Pharyngeal -- Pharyngeal- Multi-consistency -- Pharyngeal -- Pharyngeal- Pill -- Pharyngeal --  Pharyngeal Comment --  Yvone Neu I. Vear Clock, MS, CCC-SLP Acute Rehabilitation Services Office number [phone] Pager [phone] [name] 07/02/2020, 2:11 PM              ECHOCARDIOGRAM COMPLETE  Result Date: 07/01/2020    ECHOCARDIOGRAM REPORT   Patient Name:   [facility] Date of Exam: 07/01/2020 Medical Rec #:  [id number]       Height:       62.0 in Accession #:    [id number]      Weight:       239.2 lb Date of Birth:  [birth year]       BSA:          2.063 m Patient Age:    65 years        BP:            141/52 mmHg Patient Gender: F               HR:           74 bpm. Exam Location:  Inpatient Procedure: 2D Echo, Cardiac Doppler and Color Doppler Indications:    I50.40* Unspecified combined systolic (congestive) and diastolic                 (congestive) heart failure  History:        Patient has no prior history of Echocardiogram examinations.                 COPD, Signs/Symptoms:Alzheimer's, Altered Mental Status, Dyspnea                 and Shortness of Breath; Risk Factors:Diabetes.  Sonographer:    Sheralyn Boatman RDCS Referring Phys: [id number] [facility]  Sonographer Comments: Technically difficult study due to poor echo windows and patient is morbidly obese. Image acquisition challenging due to patient body habitus. Study delayed to put patient on bipap. Oxygen stats were 71 when I entered the room. Patient grabbed my probe during exam, but I was able to finish study. Patient could not follow directions. IMPRESSIONS  1. Left ventricular ejection fraction, by estimation, is 65 to 70%. The left ventricle has normal function. The left ventricle has no regional wall motion abnormalities. There is mild left ventricular hypertrophy. Left ventricular diastolic parameters are indeterminate.  2. Right ventricular systolic function is normal. The right ventricular size is normal. Tricuspid regurgitation signal is inadequate for assessing PA pressure.  3. The mitral valve is normal in structure. Trivial mitral valve regurgitation.  4. The aortic valve was not well visualized. Aortic valve regurgitation is not visualized. No aortic stenosis is present.  5. The inferior vena cava is normal in size with greater than 50% respiratory variability, suggesting right atrial pressure of 3 mmHg. FINDINGS  Left Ventricle: Left ventricular ejection fraction, by estimation, is 65 to 70%. The left ventricle has normal function. The left ventricle has no regional wall motion abnormalities. The left ventricular internal cavity size was  small. There is mild left ventricular hypertrophy. Left ventricular diastolic parameters are indeterminate. Right Ventricle: The right ventricular size is normal. No increase in right ventricular wall thickness. Right ventricular systolic function is normal. Tricuspid regurgitation signal is inadequate for assessing PA pressure. Left Atrium: Left atrial size was normal in size. Right Atrium: Right atrial size was normal in size. Pericardium: There is no evidence of pericardial effusion. Mitral Valve: The mitral valve is normal in structure. Trivial mitral valve regurgitation. Tricuspid Valve: The tricuspid valve is normal  in structure. Tricuspid valve regurgitation is not demonstrated. Aortic Valve: The aortic valve was not well visualized. Aortic valve regurgitation is not visualized. No aortic stenosis is present. Pulmonic Valve: The pulmonic valve was not well visualized. Pulmonic valve regurgitation is not visualized. Aorta: The aortic root and ascending aorta are structurally normal, with no evidence of dilitation. Venous: The inferior vena cava is normal in size with greater than 50% respiratory variability, suggesting right atrial pressure of 3 mmHg. IAS/Shunts: The interatrial septum was not well visualized.  LEFT VENTRICLE PLAX 2D LVIDd:         3.30 cm     Diastology LVIDs:         2.20 cm     LV e' medial:    5.33 cm/s LV PW:         1.20 cm     LV E/e' medial:  19.5 LV IVS:        1.50 cm     LV e' lateral:   8.05 cm/s LVOT diam:     1.70 cm     LV E/e' lateral: 12.9 LV SV:         70 LV SV Index:   34 LVOT Area:     2.27 cm  LV Volumes (MOD) LV vol d, MOD A2C: 78.5 ml LV vol d, MOD A4C: 57.6 ml LV vol s, MOD A2C: 31.2 ml LV vol s, MOD A4C: 14.8 ml LV SV MOD A2C:     47.3 ml LV SV MOD A4C:     57.6 ml LV SV MOD BP:      44.2 ml RIGHT VENTRICLE             IVC RV S prime:     16.30 cm/s  IVC diam: 1.90 cm TAPSE (M-mode): 2.6 cm LEFT ATRIUM             Index       RIGHT ATRIUM           Index LA diam:         3.80 cm 1.84 cm/m  RA Area:     12.90 cm LA Vol (A2C):   25.4 ml 12.31 ml/m RA Volume:   31.00 ml  15.03 ml/m LA Vol (A4C):   21.5 ml 10.42 ml/m LA Biplane Vol: 24.5 ml 11.88 ml/m  AORTIC VALVE LVOT Vmax:   147.00 cm/s LVOT Vmean:  95.400 cm/s LVOT VTI:    0.308 m  AORTA Ao Root diam: 2.80 cm Ao Asc diam:  3.20 cm MITRAL VALVE MV Area (PHT): 4.46 cm     SHUNTS MV Decel Time: 170 msec     Systemic VTI:  0.31 m MV E velocity: 104.00 cm/s  Systemic Diam: 1.70 cm MV A velocity: 110.00 cm/s MV E/A ratio:  0.95 Epifanio Lesches MD Electronically signed by Epifanio Lesches MD Signature Date/Time: 07/01/2020/3:20:57 PM    Final        LOS: 3 days   Osvaldo Shipper  [facility] Pager on [email or web address]  07/03/2020, 9:45 AM

## 2020-07-03 NOTE — TOC Progression Note (Signed)
Transition of Care [facility]) - Progression Note    Patient Details  Name: Stephanie Rice MRN: [id number] Date of Birth: 1955/09/17  Transition of Care [facility]) CM/SW Contact  [name], LCSW Phone Number: 07/03/2020, 2:29 PM  Clinical Narrative:   CSW spoke with pt daughter, who spoke with MD earlier today.  Daughter is in agreement with plan for pt to remain on bipap so that she can be admitted for SNF rehab.  Daughter does not want pt to return to [place] and is asking that CSW look for alternative SNF placement, preferably in the [place] area..  Pt sent out in hub.     Expected Discharge Plan: Skilled Nursing Facility Barriers to Discharge: Continued Medical Work up  Expected Discharge Plan and Services Expected Discharge Plan: Skilled Nursing Facility     Post Acute Care Choice:  (daughter requesting LTAC) Living arrangements for the past 2 months: Skilled Nursing Facility (was hospitalized at [place] for 30+ days prior to SNF)                                       Social Determinants of Health (SDOH) Interventions    Readmission Risk Interventions No flowsheet data found.

## 2020-07-04 DIAGNOSIS — E1165 Type 2 diabetes mellitus with hyperglycemia: Secondary | ICD-10-CM | POA: Diagnosis not present

## 2020-07-04 DIAGNOSIS — J441 Chronic obstructive pulmonary disease with (acute) exacerbation: Secondary | ICD-10-CM | POA: Diagnosis not present

## 2020-07-04 DIAGNOSIS — F3160 Bipolar disorder, current episode mixed, unspecified: Secondary | ICD-10-CM | POA: Diagnosis not present

## 2020-07-04 DIAGNOSIS — F0391 Unspecified dementia with behavioral disturbance: Secondary | ICD-10-CM | POA: Diagnosis not present

## 2020-07-04 LAB — BLOOD GAS, VENOUS
Acid-Base Excess: 13.8 mmol/L — ABNORMAL HIGH (ref 0.0–2.0)
Bicarbonate: 40.2 mmol/L — ABNORMAL HIGH (ref 20.0–28.0)
Drawn by: 6054
FIO2: 40
O2 Saturation: 82.2 %
Patient temperature: 37
pCO2, Ven: 79.7 mmHg (ref 44.0–60.0)
pH, Ven: 7.324 (ref 7.250–7.430)
pO2, Ven: 51.6 mmHg — ABNORMAL HIGH (ref 32.0–45.0)

## 2020-07-04 LAB — BASIC METABOLIC PANEL
Anion gap: 7 (ref 5–15)
BUN: 18 mg/dL (ref 8–23)
CO2: 39 mmol/L — ABNORMAL HIGH (ref 22–32)
Calcium: 8.4 mg/dL — ABNORMAL LOW (ref 8.9–10.3)
Chloride: 94 mmol/L — ABNORMAL LOW (ref 98–111)
Creatinine, Ser: 0.46 mg/dL (ref 0.44–1.00)
GFR, Estimated: >60 mL/min (ref >60)
Glucose, Bld: 296 mg/dL — ABNORMAL HIGH (ref 70–99)
Potassium: 4.2 mmol/L (ref 3.5–5.1)
Sodium: 140 mmol/L (ref 135–145)

## 2020-07-04 LAB — GLUCOSE, CAPILLARY
Glucose-Capillary: 194 mg/dL — ABNORMAL HIGH (ref 70–99)
Glucose-Capillary: 163 mg/dL — ABNORMAL HIGH (ref 70–99)
Glucose-Capillary: 235 mg/dL — ABNORMAL HIGH (ref 70–99)
Glucose-Capillary: 290 mg/dL — ABNORMAL HIGH (ref 70–99)
Glucose-Capillary: 280 mg/dL — ABNORMAL HIGH (ref 70–99)

## 2020-07-04 MED ORDER — INSULIN DETEMIR 100 UNIT/ML ~~LOC~~ SOLN
25.0000 [IU] | Freq: Every day | SUBCUTANEOUS | Status: DC
  Administered 2020-07-05 – 2020-07-17 (×13): 25 [IU] via SUBCUTANEOUS
  Filled 2020-07-04 (×13): qty 0.25

## 2020-07-04 MED ORDER — INSULIN ASPART 100 UNIT/ML ~~LOC~~ SOLN
0.0000 [IU] | Freq: Three times a day (TID) | SUBCUTANEOUS | Status: DC
  Administered 2020-07-04: 7 [IU] via SUBCUTANEOUS
  Administered 2020-07-05: 11 [IU] via SUBCUTANEOUS
  Administered 2020-07-05: 4 [IU] via SUBCUTANEOUS
  Administered 2020-07-05: 7 [IU] via SUBCUTANEOUS
  Administered 2020-07-06 (×2): 3 [IU] via SUBCUTANEOUS
  Administered 2020-07-06: 7 [IU] via SUBCUTANEOUS
  Administered 2020-07-07 (×2): 4 [IU] via SUBCUTANEOUS
  Administered 2020-07-07: 3 [IU] via SUBCUTANEOUS
  Administered 2020-07-08: 4 [IU] via SUBCUTANEOUS
  Administered 2020-07-08: 3 [IU] via SUBCUTANEOUS
  Administered 2020-07-08: 4 [IU] via SUBCUTANEOUS
  Administered 2020-07-09: 7 [IU] via SUBCUTANEOUS
  Administered 2020-07-09: 3 [IU] via SUBCUTANEOUS
  Administered 2020-07-10: 4 [IU] via SUBCUTANEOUS
  Administered 2020-07-10 – 2020-07-11 (×2): 3 [IU] via SUBCUTANEOUS
  Administered 2020-07-11 – 2020-07-12 (×2): 7 [IU] via SUBCUTANEOUS
  Administered 2020-07-12: 3 [IU] via SUBCUTANEOUS
  Administered 2020-07-13: 4 [IU] via SUBCUTANEOUS
  Administered 2020-07-13: 2 [IU] via SUBCUTANEOUS
  Administered 2020-07-14: 3 [IU] via SUBCUTANEOUS
  Administered 2020-07-14: 7 [IU] via SUBCUTANEOUS
  Administered 2020-07-15: 4 [IU] via SUBCUTANEOUS
  Administered 2020-07-15 – 2020-07-16 (×2): 3 [IU] via SUBCUTANEOUS
  Administered 2020-07-16: 7 [IU] via SUBCUTANEOUS
  Administered 2020-07-16: 3 [IU] via SUBCUTANEOUS
  Administered 2020-07-17: 7 [IU] via SUBCUTANEOUS
  Administered 2020-07-18: 4 [IU] via SUBCUTANEOUS
  Administered 2020-07-18 – 2020-07-19 (×2): 7 [IU] via SUBCUTANEOUS
  Administered 2020-07-19: 4 [IU] via SUBCUTANEOUS
  Administered 2020-07-19: 7 [IU] via SUBCUTANEOUS
  Administered 2020-07-20: 3 [IU] via SUBCUTANEOUS
  Administered 2020-07-20: 4 [IU] via SUBCUTANEOUS
  Administered 2020-07-21: 3 [IU] via SUBCUTANEOUS
  Administered 2020-07-21 (×2): 4 [IU] via SUBCUTANEOUS
  Administered 2020-07-22: 3 [IU] via SUBCUTANEOUS
  Administered 2020-07-22: 4 [IU] via SUBCUTANEOUS
  Administered 2020-07-22 – 2020-07-26 (×8): 3 [IU] via SUBCUTANEOUS
  Administered 2020-07-27 (×2): 4 [IU] via SUBCUTANEOUS
  Administered 2020-07-27: 7 [IU] via SUBCUTANEOUS
  Administered 2020-07-28: 4 [IU] via SUBCUTANEOUS
  Administered 2020-07-28: 7 [IU] via SUBCUTANEOUS
  Administered 2020-07-29: 3 [IU] via SUBCUTANEOUS
  Administered 2020-07-29: 4 [IU] via SUBCUTANEOUS
  Administered 2020-07-29: 3 [IU] via SUBCUTANEOUS

## 2020-07-04 NOTE — Progress Notes (Signed)
Pt VBG is 79.7 provider [place] B,Zierle-Ghosh,DO

## 2020-07-04 NOTE — Progress Notes (Signed)
[facility] PROGRESS NOTE   Stephanie Rice ZOX:[id number] DOB: Jan 20, 1956 DOA: 06/30/2020  PCP: [name], PA-C  Brief History/Interval Summary: 65 y.o. female with medical history significant of chronic hypoxic and hypercapnic respiratory failure secondary COPD, on Trelegy PRN +2 L PRN, HTN, recent diagnosed DVT, morbid obesity, IDDM, Dementia, bipolar disorder, presented with altered mentation.  Patient was found to have hypercapnic respiratory failure.  Was hospitalized for further management.  Consultants: None  Procedures: None yet  Antibiotics: Anti-infectives (From admission, onward)   Start     Dose/Rate Route Frequency Ordered Stop   07/03/20 1400  cephALEXin (KEFLEX) capsule 500 mg        500 mg Oral Every 8 hours 07/03/20 0954 07/07/20 1359   06/30/20 2200  doxycycline (VIBRA-TABS) tablet 100 mg        100 mg Oral Every 12 hours 06/30/20 1612 07/05/20 2159   06/30/20 2030  cefTRIAXone (ROCEPHIN) 2 g in sodium chloride 0.9 % 100 mL IVPB  Status:  Discontinued        2 g 200 mL/hr over 30 Minutes Intravenous Daily at bedtime 06/30/20 2006 07/03/20 0954   06/30/20 1800  Ampicillin-Sulbactam (UNASYN) 3 g in sodium chloride 0.9 % 100 mL IVPB  Status:  Discontinued        3 g 200 mL/hr over 30 Minutes Intravenous Every 6 hours 06/30/20 1703 06/30/20 1954      Subjective/Interval History: Mains pleasantly confused that is appears to be her baseline.  Denies any pain.  Has a smile on her face.     Assessment/Plan:  Acute metabolic encephalopathy Likely secondary to hypercapnia along with the urinary tract infection.  No focal deficits noted.  CT head was reassuring.  Patient with known history of bipolar disorder and dementia.   Mentation he seems to be back to baseline.  Continue aspiration precautions.     Acute COPD exacerbation with acute on chronic hypoxic and hypercapnic respiratory failure/community-acquired pneumonia Patient was placed on BiPAP.   Respiratory status appears to be stable.   Continue BiPAP every night during daytime hours if she looks somnolent.  Patient apparently on trilogy vent prior to admission. Continue with steroids nebulizer treatments and doxycycline.   ABG did show improvement in pH.  Influenza and COVID-19 test were negative. Apparently none of the skilled nursing facilities take patients who are on trilogy went.  Patient can be discharged with BiPAP however.  Discussed with daughter who is agreeable to this.  Atrial fibrillation with RVR Patient went into rapid atrial fibrillation on the morning of 2/17.  Was given Cardizem and metoprolol.  Converted back to sinus rhythm.  Was placed on metoprolol.   Echocardiogram shows normal systolic function.  Mild LVH was noted.  No significant valvular abnormalities noted.   TSH noted to be normal.  Troponins were normal.  Patient already on Eliquis for history of DVT. Seems to be stable.  Monitor on telemetry.  Urinary tract infection/urinary retention Patient was initially on ceftriaxone.  Urine culture with multiple species.  Patient transition to Keflex.   Has had urinary retention occasionally.  This is most likely due to poor mobility.  Looks like she had straight cath yesterday afternoon.  If this remains a recurrent problem then we may have to place a Foley catheter and then do a voiding trial in the skilled nursing facility once she is more mobile.   Continue bladder scans  Oropharyngeal dysphagia Seen by speech therapy.  On dysphagia 2 diet  with honey thick liquids.  Mild hyperkalemia Potassium noted to be normal today.  She was given [facility] yesterday.  Continue to monitor periodically   Essential hypertension Due to low blood pressures clonidine was discontinued.  Patient placed on metoprolol for A. fib.  Blood pressures then went into the hypertensive range possibly due to rebound hypertension.  She was placed back on her clonidine..  Continue to follow  trends and adjust medication dosage accordingly   Diabetes mellitus type 2, on insulin, uncontrolled with hyperglycemia/Diabetic neuropathy HbA1c 7.2.  Patient remains on Levemir and SSI.  Part.  Occasional high readings noted  History of bipolar disorder Patient on valproic acid and loxapine.  Medication reconciliation was done and apparently patient has not been on Topamax which was discontinued.  Not on gabapentin either which was also discontinued.  Dose of melatonin was decreased.  Steroids could be contributing to her occasional agitation.  History of DVT Continue Eliquis.  Morbid obesity Estimated body mass index is 43.75 kg/m as calculated from the following:   Height as of 02/04/16: 5\' 2"  (1.575 m).   Weight as of 02/04/16: 108.5 kg.    DVT Prophylaxis: On Eliquis Code Status: DNR Family Communication: Daughter was updated yesterday. Disposition Plan: Patient will need to go to SNF when medically improved.  However the nursing facility from where she came does not take patients on trilogy vent.  This was discussed with pulmonology and daughter.  Okay to switch to BiPAP at facility.  Daughter is agreeable.    Status is: Inpatient  Remains inpatient appropriate because:Altered mental status, IV treatments appropriate due to intensity of illness or inability to take PO and Inpatient level of care appropriate due to severity of illness   Dispo: The patient is from: SNF              Anticipated d/c is to: SNF              Anticipated d/c date is: 3 days              Patient currently is not medically stable to d/c.   Difficult to place patient No      Medications:  Scheduled: . apixaban  5 mg Oral BID  . atorvastatin  20 mg Oral QHS  . cephALEXin  500 mg Oral Q8H  . cloNIDine  0.1 mg Oral BID  . doxycycline  100 mg Oral Q12H  . fluticasone furoate-vilanterol  1 puff Inhalation Daily  . insulin aspart  0-15 Units Subcutaneous TID WC  . insulin aspart  0-5 Units  Subcutaneous QHS  . insulin detemir  20 Units Subcutaneous Daily  . loxapine  20 mg Oral QHS  . melatonin  5 mg Oral QHS  . metoprolol tartrate  12.5 mg Oral BID  . pantoprazole  40 mg Oral Daily  . polyvinyl alcohol  1 drop Both Eyes Q breakfast  . predniSONE  40 mg Oral Q breakfast  . valproic acid  250 mg Oral QID   Continuous:  02/06/16, haloperidol lactate, hydrALAZINE, Resource ThickenUp Clear   Objective:  Vital Signs  Vitals:   07/04/20 0334 07/04/20 0347 07/04/20 0729 07/04/20 0801  BP:  (!) 148/76 (!) 151/71   Pulse: 64 61 60   Resp: 18 15 17    Temp:  98.7 F (37.1 C)    TempSrc:  Axillary    SpO2: 100% 100% 91% 92%    Intake/Output Summary (Last 24 hours) at 07/04/2020 1024 Last data filed  at 07/04/2020 0200 Gross per 24 hour  Intake --  Output 850 ml  Net -850 ml   There were no vitals filed for this visit.   General appearance: Awake alert.  In no distress Resp: Normal effort at rest.  Coarse breath sounds with few crackles at the bases.  No wheezing appreciated today. Cardio: S1-S2 is normal regular.  No S3-S4.  No rubs murmurs or bruit GI: Abdomen is soft.  Nontender nondistended.  Bowel sounds are present normal.  No masses organomegaly Extremities: Moving all of her extremities Neurologic: No focal neurological deficits.       Lab Results:  Data Reviewed: I have personally reviewed following labs and imaging studies  CBC: Recent Labs  Lab 06/30/20 1226 06/30/20 1516 07/01/20 0956 07/02/20 0220 07/03/20 0703  WBC 4.0  --  3.3* 5.1 4.5  NEUTROABS 1.8  --   --   --   --   HGB 12.0 13.3 12.2 12.6 12.0  HCT 43.1 39.0 40.4 41.9 39.9  MCV 105.9*  --  101.8* 101.5* 102.3*  PLT 209  --  207 208 208    Basic Metabolic Panel: Recent Labs  Lab 06/30/20 1226 06/30/20 1516 07/01/20 0956 07/02/20 0220 07/03/20 0425 07/04/20 0136  NA 138 138 143 141 143 140  K 5.4* 5.4* 5.6* 5.4* 4.4 4.2  CL 93*  --  98 97* 97* 94*  CO2 36*  --   34* 33* 34* 39*  GLUCOSE 131*  --  254* 215* 170* 296*  BUN 34*  --  22 22 25* 18  CREATININE 0.79  --  0.59 0.50 0.41* 0.46  CALCIUM 7.9*  --  8.4* 8.5* 8.6* 8.4*  MG  --   --   --  2.0  --   --     GFR: CrCl cannot be calculated (Unknown ideal weight.).  Liver Function Tests: Recent Labs  Lab 06/30/20 1226  AST 15  ALT 14  ALKPHOS 64  BILITOT 0.5  PROT 6.0*  ALBUMIN 2.4*    Recent Labs  Lab 06/30/20 1226  LIPASE 19    CBG: Recent Labs  Lab 07/03/20 0730 07/03/20 1143 07/03/20 1614 07/03/20 2215 07/04/20 0728  GLUCAP 220* 172* 219* 303* 194*      Recent Results (from the past 240 hour(s))  Resp Panel by RT-PCR (Flu A&B, Covid) Nasopharyngeal Swab     Status: None   Collection Time: 06/30/20  3:57 PM   Specimen: Nasopharyngeal Swab; Nasopharyngeal(NP) swabs in vial transport medium  Result Value Ref Range Status   SARS Coronavirus 2 by RT PCR NEGATIVE NEGATIVE Final    Comment: (NOTE) SARS-CoV-2 target nucleic acids are NOT DETECTED.  The SARS-CoV-2 RNA is generally detectable in upper respiratory specimens during the acute phase of infection. The lowest concentration of SARS-CoV-2 viral copies this assay can detect is 138 copies/mL. A negative result does not preclude SARS-Cov-2 infection and should not be used as the sole basis for treatment or other patient management decisions. A negative result may occur with  improper specimen collection/handling, submission of specimen other than nasopharyngeal swab, presence of viral mutation(s) within the areas targeted by this assay, and inadequate number of viral copies(<138 copies/mL). A negative result must be combined with clinical observations, patient history, and epidemiological information. The expected result is Negative.  Fact Sheet for Patients:  BloggerCourse.comhttps://www.fda.gov/media/152166/download  Fact Sheet for Healthcare Providers:  SeriousBroker.ithttps://www.fda.gov/media/152162/download  This test is no t yet  approved or cleared by the [place]  FDA and  has been authorized for detection and/or diagnosis of SARS-CoV-2 by FDA under an Emergency Use Authorization (EUA). This EUA will remain  in effect (meaning this test can be used) for the duration of the COVID-19 declaration under Section 564(b)(1) of the Act, 21 U.S.C.section 360bbb-3(b)(1), unless the authorization is terminated  or revoked sooner.       Influenza A by PCR NEGATIVE NEGATIVE Final   Influenza B by PCR NEGATIVE NEGATIVE Final    Comment: (NOTE) The Xpert Xpress SARS-CoV-2/FLU/RSV plus assay is intended as an aid in the diagnosis of influenza from Nasopharyngeal swab specimens and should not be used as a sole basis for treatment. Nasal washings and aspirates are unacceptable for Xpert Xpress SARS-CoV-2/FLU/RSV testing.  Fact Sheet for Patients: BloggerCourse.com  Fact Sheet for Healthcare Providers: SeriousBroker.it  This test is not yet approved or cleared by the [place] FDA and has been authorized for detection and/or diagnosis of SARS-CoV-2 by FDA under an Emergency Use Authorization (EUA). This EUA will remain in effect (meaning this test can be used) for the duration of the COVID-19 declaration under Section 564(b)(1) of the Act, 21 U.S.C. section 360bbb-3(b)(1), unless the authorization is terminated or revoked.  Performed at [facility], [address], [place], [place] [address]       Radiology Studies: DG Swallowing Func-Speech Pathology  Result Date: 07/02/2020 Objective Swallowing Evaluation: Type of Study: MBS-Modified Barium Swallow Study  Patient Details Name: Stephanie Rice MRN: [id number] Date of Birth: [birth year] Today's Date: 07/02/2020 Time: SLP Start Time (ACUTE ONLY): 1235 -SLP Stop Time (ACUTE ONLY): 1250 SLP Time Calculation (min) (ACUTE ONLY): 15 min Past Medical History: Past Medical History: Diagnosis Date . Arthritis  .  Barrett esophagus  . Bipolar 1 disorder (HCC)  . COPD (chronic obstructive pulmonary disease) (HCC)  . Dementia (HCC)  . Diabetes mellitus without complication (HCC)  . GERD (gastroesophageal reflux disease)  Past Surgical History: Past Surgical History: Procedure Laterality Date . BREAST CYST EXCISION   . BREAST SURGERY   HPI: Pt is a 65 y.o. female with medical history significant of chronic hypoxic and hypercapnic respiratory failure secondary COPD, on Trelegy PRN +2 L PRN, HTN, recent lydiagnosed DVT, morbid obesity, IDDM, Dementia, bipolar disorder, who presented with altered mentation. CT head was negative. CXR 2/15: Right middle lobe consolidation most compatible with pneumonia. MD considered this to be residue imaging changes from a PNA pt had last month, vs a new infection and coverage was expanded for aspiration. Per EMR, pt's daughter has reported that pt has chronic dysphagia on pureed diet, with strict instruction of sitting up 30 min before feed.  No data recorded Assessment / Plan / Recommendation CHL IP CLINICAL IMPRESSIONS 07/02/2020 Clinical Impression Pt presented with advanced degenerative changes in the cervical spine with bulky anterior endplate osteophytosis (as was noted and described by radiologist following maxillofacial CT on 02/03/16). This limited epiglottic inversion and reduced airway protection. Airway protection was inconsistently achieved by approximation of the arytenoids to the laryngeal surface of the epiglottis. Oropharyngeal dysphagia was noted characterized by reduced bolus cohesion, reduced lingual retraction, reduced pharyngeal constriction, a pharyngeal delay, reduced hyolaryngeal elevation, and reduced anterior laryngeal movement. Pt demonstrated premature spillage to the valleculae and pyriform sinuses, vallecular residue, posterior pharyngeal wall residue, penetration (PAS 3) of nectar thick liquids and aspiration (PAS 7,8) of thin and nectar thick liquids. Laryngeal  invasion was due to impairments in timing of the swallow and due to impairments in timing.  Aspiration inconsistently resulted in coughing, but a wet vocal quality was observed following other some other instances of aspiration. Pt was unable to follow the commands necessary for implementation of any compensatory strategies. Pt was prompted to cough, but was unable to follow this command and began laughing thereafter. Pt independently used secondary swallows which reduced pharyngeal residue to a functional level. Reduced bolus sizes reduced, but did not eliminate, laryngeal invasion. A dysphagia 2 diet with honey thick liquids is recommended at this time. SLP will follow for treatment. SLP Visit Diagnosis Dysphagia, unspecified (R13.10) Attention and concentration deficit following -- Frontal lobe and executive function deficit following -- Impact on safety and function Mild aspiration risk;Moderate aspiration risk   CHL IP TREATMENT RECOMMENDATION 07/02/2020 Treatment Recommendations Therapy as outlined in treatment plan below   Prognosis 07/02/2020 Prognosis for Safe Diet Advancement Good Barriers to Reach Goals Time post onset;Cognitive deficits;Severity of deficits Barriers/Prognosis Comment -- CHL IP DIET RECOMMENDATION 07/02/2020 SLP Diet Recommendations Dysphagia 2 (Fine chop) solids;Honey thick liquids Liquid Administration via Cup;Straw Medication Administration Crushed with puree Compensations Small sips/bites;Slow rate;Multiple dry swallows after each bite/sip Postural Changes Seated upright at 90 degrees   CHL IP OTHER RECOMMENDATIONS 07/02/2020 Recommended Consults -- Oral Care Recommendations Oral care BID Other Recommendations Order thickener from pharmacy   CHL IP FOLLOW UP RECOMMENDATIONS 07/02/2020 Follow up Recommendations Skilled Nursing facility   [facility] IP FREQUENCY AND DURATION 07/02/2020 Speech Therapy Frequency (ACUTE ONLY) min 2x/week Treatment Duration 2 weeks      CHL IP ORAL PHASE 07/02/2020 Oral  Phase Impaired Oral - Pudding Teaspoon -- Oral - Pudding Cup -- Oral - Honey Teaspoon -- Oral - Honey Cup Decreased bolus cohesion Oral - Nectar Teaspoon -- Oral - Nectar Cup Decreased bolus cohesion Oral - Nectar Straw Decreased bolus cohesion;Premature spillage Oral - Thin Teaspoon -- Oral - Thin Cup Decreased bolus cohesion;Premature spillage Oral - Thin Straw Decreased bolus cohesion;Premature spillage Oral - Puree Decreased bolus cohesion;Premature spillage Oral - Mech Soft Decreased bolus cohesion;Impaired mastication;Premature spillage Oral - Regular -- Oral - Multi-Consistency -- Oral - Pill -- Oral Phase - Comment --  CHL IP PHARYNGEAL PHASE 07/02/2020 Pharyngeal Phase Impaired Pharyngeal- Pudding Teaspoon -- Pharyngeal -- Pharyngeal- Pudding Cup -- Pharyngeal -- Pharyngeal- Honey Teaspoon -- Pharyngeal -- Pharyngeal- Honey Cup Pharyngeal residue - valleculae;Pharyngeal residue - pyriform;Reduced pharyngeal peristalsis;Reduced epiglottic inversion;Reduced anterior laryngeal mobility;Reduced laryngeal elevation;Reduced airway/laryngeal closure;Reduced tongue base retraction Pharyngeal -- Pharyngeal- Nectar Teaspoon -- Pharyngeal -- Pharyngeal- Nectar Cup Pharyngeal residue - valleculae;Pharyngeal residue - pyriform;Reduced pharyngeal peristalsis;Reduced epiglottic inversion;Reduced anterior laryngeal mobility;Reduced laryngeal elevation;Reduced airway/laryngeal closure;Reduced tongue base retraction;Penetration/Aspiration during swallow;Penetration/Apiration after swallow Pharyngeal Material enters airway, remains ABOVE vocal cords and not ejected out;Material enters airway, passes BELOW cords and not ejected out despite cough attempt by patient;Material enters airway, passes BELOW cords without attempt by patient to eject out (silent aspiration) Pharyngeal- Nectar Straw Pharyngeal residue - valleculae;Pharyngeal residue - pyriform;Reduced pharyngeal peristalsis;Reduced epiglottic inversion;Reduced anterior  laryngeal mobility;Reduced laryngeal elevation;Reduced airway/laryngeal closure;Reduced tongue base retraction;Penetration/Aspiration during swallow;Penetration/Aspiration before swallow Pharyngeal Material enters airway, passes BELOW cords without attempt by patient to eject out (silent aspiration);Material enters airway, passes BELOW cords and not ejected out despite cough attempt by patient Pharyngeal- Thin Teaspoon -- Pharyngeal -- Pharyngeal- Thin Cup Pharyngeal residue - valleculae;Pharyngeal residue - pyriform;Reduced pharyngeal peristalsis;Reduced epiglottic inversion;Reduced anterior laryngeal mobility;Reduced laryngeal elevation;Reduced airway/laryngeal closure;Reduced tongue base retraction;Penetration/Aspiration during swallow;Penetration/Apiration after swallow Pharyngeal Material enters airway, passes BELOW cords without attempt by patient to eject out (silent aspiration)  Pharyngeal- Thin Straw Pharyngeal residue - valleculae;Pharyngeal residue - pyriform;Reduced pharyngeal peristalsis;Reduced epiglottic inversion;Reduced anterior laryngeal mobility;Reduced laryngeal elevation;Reduced airway/laryngeal closure;Reduced tongue base retraction;Penetration/Aspiration during swallow Pharyngeal Material enters airway, passes BELOW cords without attempt by patient to eject out (silent aspiration) Pharyngeal- Puree Pharyngeal residue - valleculae;Pharyngeal residue - pyriform;Reduced pharyngeal peristalsis;Reduced epiglottic inversion;Reduced anterior laryngeal mobility;Reduced laryngeal elevation;Reduced airway/laryngeal closure;Reduced tongue base retraction Pharyngeal -- Pharyngeal- Mechanical Soft Pharyngeal residue - valleculae;Pharyngeal residue - pyriform;Reduced pharyngeal peristalsis;Reduced epiglottic inversion;Reduced anterior laryngeal mobility;Reduced laryngeal elevation;Reduced airway/laryngeal closure;Reduced tongue base retraction Pharyngeal -- Pharyngeal- Regular -- Pharyngeal -- Pharyngeal-  Multi-consistency -- Pharyngeal -- Pharyngeal- Pill -- Pharyngeal -- Pharyngeal Comment --  Shanika I. Vear Clock, MS, CCC-SLP Acute Rehabilitation Services Office number [phone] Pager [phone] [name] 07/02/2020, 2:11 PM                  LOS: 4 days   Osvaldo Shipper  [facility] Pager on [email or web address]  07/04/2020, 10:24 AM

## 2020-07-05 DIAGNOSIS — J441 Chronic obstructive pulmonary disease with (acute) exacerbation: Secondary | ICD-10-CM | POA: Diagnosis not present

## 2020-07-05 LAB — BASIC METABOLIC PANEL
Anion gap: 8 (ref 5–15)
BUN: 11 mg/dL (ref 8–23)
CO2: 38 mmol/L — ABNORMAL HIGH (ref 22–32)
Calcium: 8.7 mg/dL — ABNORMAL LOW (ref 8.9–10.3)
Chloride: 93 mmol/L — ABNORMAL LOW (ref 98–111)
Creatinine, Ser: 0.4 mg/dL — ABNORMAL LOW (ref 0.44–1.00)
GFR, Estimated: >60 mL/min (ref >60)
Glucose, Bld: 242 mg/dL — ABNORMAL HIGH (ref 70–99)
Potassium: 3.8 mmol/L (ref 3.5–5.1)
Sodium: 139 mmol/L (ref 135–145)

## 2020-07-05 LAB — GLUCOSE, CAPILLARY
Glucose-Capillary: 172 mg/dL — ABNORMAL HIGH (ref 70–99)
Glucose-Capillary: 226 mg/dL — ABNORMAL HIGH (ref 70–99)
Glucose-Capillary: 272 mg/dL — ABNORMAL HIGH (ref 70–99)
Glucose-Capillary: 243 mg/dL — ABNORMAL HIGH (ref 70–99)

## 2020-07-05 MED ORDER — ACETAMINOPHEN 325 MG PO TABS
650.0000 mg | ORAL_TABLET | Freq: Four times a day (QID) | ORAL | Status: DC | PRN
  Administered 2020-07-05 – 2020-08-01 (×8): 650 mg via ORAL
  Filled 2020-07-05 (×8): qty 2

## 2020-07-05 MED ORDER — CLONAZEPAM 0.5 MG PO TABS: 0.2500 mg | ORAL_TABLET | Freq: Three times a day (TID) | ORAL | Status: DC | PRN

## 2020-07-05 MED ORDER — CLONAZEPAM 0.25 MG PO TBDP
0.2500 mg | ORAL_TABLET | Freq: Three times a day (TID) | ORAL | Status: DC | PRN
  Administered 2020-07-05 – 2020-07-08 (×7): 0.25 mg via ORAL
  Filled 2020-07-05 (×7): qty 1

## 2020-07-05 MED ORDER — PREDNISONE 20 MG PO TABS
20.0000 mg | ORAL_TABLET | Freq: Every day | ORAL | Status: DC
  Administered 2020-07-05 – 2020-07-09 (×4): 20 mg via ORAL
  Filled 2020-07-05 (×4): qty 1

## 2020-07-05 MED ORDER — TRAMADOL HCL 50 MG PO TABS
50.0000 mg | ORAL_TABLET | Freq: Four times a day (QID) | ORAL | Status: DC | PRN
  Administered 2020-07-06 – 2020-07-08 (×4): 50 mg via ORAL
  Filled 2020-07-05 (×4): qty 1

## 2020-07-05 MED ORDER — CLONIDINE HCL 0.2 MG PO TABS
0.2000 mg | ORAL_TABLET | Freq: Two times a day (BID) | ORAL | Status: DC
  Administered 2020-07-05 – 2020-07-08 (×5): 0.2 mg via ORAL
  Filled 2020-07-05 (×5): qty 1

## 2020-07-05 NOTE — Progress Notes (Signed)
[facility] PROGRESS NOTE   Stephanie Rice KGY:[id number] DOB: 1955-07-09 DOA: 06/30/2020  PCP: [name], PA-C  Brief History/Interval Summary: 65 y.o. female with medical history significant of chronic hypoxic and hypercapnic respiratory failure secondary COPD, on Trelegy PRN +2 L PRN, HTN, recent diagnosed DVT, morbid obesity, IDDM, Dementia, bipolar disorder, presented with altered mentation.  Patient was found to have hypercapnic respiratory failure.  Was hospitalized for further management.  Consultants: None  Procedures: None yet  Antibiotics: Anti-infectives (From admission, onward)   Start     Dose/Rate Route Frequency Ordered Stop   07/03/20 1400  cephALEXin (KEFLEX) capsule 500 mg        500 mg Oral Every 8 hours 07/03/20 0954 07/07/20 1359   06/30/20 2200  doxycycline (VIBRA-TABS) tablet 100 mg        100 mg Oral Every 12 hours 06/30/20 1612 07/05/20 2159   06/30/20 2030  cefTRIAXone (ROCEPHIN) 2 g in sodium chloride 0.9 % 100 mL IVPB  Status:  Discontinued        2 g 200 mL/hr over 30 Minutes Intravenous Daily at bedtime 06/30/20 2006 07/03/20 0954   06/30/20 1800  Ampicillin-Sulbactam (UNASYN) 3 g in sodium chloride 0.9 % 100 mL IVPB  Status:  Discontinued        3 g 200 mL/hr over 30 Minutes Intravenous Every 6 hours 06/30/20 1703 06/30/20 1954      Subjective/Interval History: Patient remains pleasantly confused.  Probably close to her baseline.  No overnight issues reported    Assessment/Plan:  Acute metabolic encephalopathy Likely secondary to hypercapnia along with the urinary tract infection.  No focal deficits noted.  CT head was reassuring.  Patient with known history of bipolar disorder and dementia.   Mentation he seems to be back to baseline.  Continue aspiration precautions.     Acute COPD exacerbation with acute on chronic hypoxic and hypercapnic respiratory failure/community-acquired pneumonia Patient was placed on BiPAP.   Respiratory status appears to be stable.   Continue BiPAP every night during daytime hours if she looks somnolent.  Patient apparently on trilogy vent prior to admission. We will start tapering steroids.  Doxycycline for 5 days.  Nebulizer treatments. ABG did show improvement in pH.  Influenza and COVID-19 test were negative. Apparently none of the skilled nursing facilities take patients who are on trilogy vent.  Patient can be discharged with BiPAP however.  Discussed with daughter who is agreeable to this.  Atrial fibrillation with RVR Patient went into rapid atrial fibrillation on the morning of 2/17.  Was given Cardizem and metoprolol.  Converted back to sinus rhythm.  Was placed on metoprolol.   Echocardiogram shows normal systolic function.  Mild LVH was noted.  No significant valvular abnormalities noted.   TSH noted to be normal.  Troponins were normal.  Patient already on Eliquis for history of DVT. Remains stable.  Maintaining sinus rhythm.  Continue metoprolol.  Urinary tract infection/urinary retention Patient was initially on ceftriaxone.  Urine culture with multiple species.  Patient transition to Keflex.   Has had urinary retention occasionally.  This is most likely due to poor mobility.   She underwent in and out catheterization on 2/18.  Does not look like she required any in the last 24 hours.  We will continue to monitor.   Continue bladder scans  Oropharyngeal dysphagia Seen by speech therapy.  On dysphagia 2 diet with honey thick liquids.  Mild hyperkalemia Corrected with Lokelma.  Normal today.  Essential hypertension Initially clonidine was held due to low blood pressures.  Patient placed on metoprolol for A. fib.   Blood pressures then went into the hypertensive range possibly due to rebound hypertension.  She was placed back on her clonidine.  Blood pressure better though elevated this morning.  If she remains hypertensive then may have to adjust the dose of her  clonidine.  Diabetes mellitus type 2, on insulin, uncontrolled with hyperglycemia/Diabetic neuropathy HbA1c 7.2.  Patient remains on Levemir and SSI.  Continue to monitor.  History of bipolar disorder Patient on valproic acid and loxapine.  Medication reconciliation was done and apparently patient has not been on Topamax which was discontinued.  Not on gabapentin either which was also discontinued.  Dose of melatonin was decreased.  Steroids could be contributing to her occasional agitation.  Steroids to be tapered.  History of DVT Continue Eliquis.  Morbid obesity Estimated body mass index is 43.75 kg/m as calculated from the following:   Height as of 02/04/16: 5\' 2"  (1.575 m).   Weight as of 02/04/16: 108.5 kg.    DVT Prophylaxis: On Eliquis Code Status: DNR Family Communication: No family at bedside.  Daughter was updated on Friday. Disposition Plan: Plan is for patient to go to SNF with BiPAP.  Status is: Inpatient  Remains inpatient appropriate because:Altered mental status, IV treatments appropriate due to intensity of illness or inability to take PO and Inpatient level of care appropriate due to severity of illness   Dispo: The patient is from: SNF              Anticipated d/c is to: SNF              Anticipated d/c date is: 3 days              Patient currently is not medically stable to d/c.   Difficult to place patient No      Medications:  Scheduled: . apixaban  5 mg Oral BID  . atorvastatin  20 mg Oral QHS  . cephALEXin  500 mg Oral Q8H  . cloNIDine  0.1 mg Oral BID  . doxycycline  100 mg Oral Q12H  . fluticasone furoate-vilanterol  1 puff Inhalation Daily  . insulin aspart  0-20 Units Subcutaneous TID WC  . insulin aspart  0-5 Units Subcutaneous QHS  . insulin detemir  25 Units Subcutaneous Daily  . loxapine  20 mg Oral QHS  . melatonin  5 mg Oral QHS  . metoprolol tartrate  12.5 mg Oral BID  . pantoprazole  40 mg Oral Daily  . polyvinyl alcohol  1  drop Both Eyes Q breakfast  . predniSONE  20 mg Oral Q breakfast  . valproic acid  250 mg Oral QID   Continuous:  Thursday, albuterol, haloperidol lactate, hydrALAZINE, Resource ThickenUp Clear, traMADol   Objective:  Vital Signs  Vitals:   07/05/20 0434 07/05/20 0737 07/05/20 0815 07/05/20 0825  BP: 124/84 (!) 188/78  (!) 186/82  Pulse: 60 69  79  Resp: 19 19    Temp: 99.4 F (37.4 C)     TempSrc: Axillary     SpO2: 100% 100% 100%     Intake/Output Summary (Last 24 hours) at 07/05/2020 1025 Last data filed at 07/05/2020 0800 Gross per 24 hour  Intake --  Output 2400 ml  Net -2400 ml   There were no vitals filed for this visit.   General appearance: Awake alert.  In no distress.  Pleasantly  confused Resp: Coarse breath sounds with few crackles at the bases.  No wheezing or rhonchi. Cardio: S1-S2 is normal regular.  No S3-S4.  No rubs murmurs or bruit GI: Abdomen is soft.  Nontender nondistended.  Bowel sounds are present normal.  No masses organomegaly Extremities: No edema. Neurologic: No focal neurological deficits.         Lab Results:  Data Reviewed: I have personally reviewed following labs and imaging studies  CBC: Recent Labs  Lab 06/30/20 1226 06/30/20 1516 07/01/20 0956 07/02/20 0220 07/03/20 0703  WBC 4.0  --  3.3* 5.1 4.5  NEUTROABS 1.8  --   --   --   --   HGB 12.0 13.3 12.2 12.6 12.0  HCT 43.1 39.0 40.4 41.9 39.9  MCV 105.9*  --  101.8* 101.5* 102.3*  PLT 209  --  207 208 208    Basic Metabolic Panel: Recent Labs  Lab 07/01/20 0956 07/02/20 0220 07/03/20 0425 07/04/20 0136 07/05/20 0351  NA 143 141 143 140 139  K 5.6* 5.4* 4.4 4.2 3.8  CL 98 97* 97* 94* 93*  CO2 34* 33* 34* 39* 38*  GLUCOSE 254* 215* 170* 296* 242*  BUN 22 22 25* 18 11  CREATININE 0.59 0.50 0.41* 0.46 0.40*  CALCIUM 8.4* 8.5* 8.6* 8.4* 8.7*  MG  --  2.0  --   --   --     GFR: CrCl cannot be calculated (Unknown ideal weight.).  Liver Function  Tests: Recent Labs  Lab 06/30/20 1226  AST 15  ALT 14  ALKPHOS 64  BILITOT 0.5  PROT 6.0*  ALBUMIN 2.4*    Recent Labs  Lab 06/30/20 1226  LIPASE 19    CBG: Recent Labs  Lab 07/04/20 1142 07/04/20 1547 07/04/20 2205 07/04/20 2314 07/05/20 0735  GLUCAP 163* 235* 290* 280* 172*      Recent Results (from the past 240 hour(s))  Resp Panel by RT-PCR (Flu A&B, Covid) Nasopharyngeal Swab     Status: None   Collection Time: 06/30/20  3:57 PM   Specimen: Nasopharyngeal Swab; Nasopharyngeal(NP) swabs in vial transport medium  Result Value Ref Range Status   SARS Coronavirus 2 by RT PCR NEGATIVE NEGATIVE Final    Comment: (NOTE) SARS-CoV-2 target nucleic acids are NOT DETECTED.  The SARS-CoV-2 RNA is generally detectable in upper respiratory specimens during the acute phase of infection. The lowest concentration of SARS-CoV-2 viral copies this assay can detect is 138 copies/mL. A negative result does not preclude SARS-Cov-2 infection and should not be used as the sole basis for treatment or other patient management decisions. A negative result may occur with  improper specimen collection/handling, submission of specimen other than nasopharyngeal swab, presence of viral mutation(s) within the areas targeted by this assay, and inadequate number of viral copies(<138 copies/mL). A negative result must be combined with clinical observations, patient history, and epidemiological information. The expected result is Negative.  Fact Sheet for Patients:  BloggerCourse.comhttps://www.fda.gov/media/152166/download  Fact Sheet for Healthcare Providers:  SeriousBroker.ithttps://www.fda.gov/media/152162/download  This test is no t yet approved or cleared by the [place] FDA and  has been authorized for detection and/or diagnosis of SARS-CoV-2 by FDA under an Emergency Use Authorization (EUA). This EUA will remain  in effect (meaning this test can be used) for the duration of the COVID-19 declaration under  Section 564(b)(1) of the Act, 21 U.S.C.section 360bbb-3(b)(1), unless the authorization is terminated  or revoked sooner.       Influenza A by PCR  NEGATIVE NEGATIVE Final   Influenza B by PCR NEGATIVE NEGATIVE Final    Comment: (NOTE) The Xpert Xpress SARS-CoV-2/FLU/RSV plus assay is intended as an aid in the diagnosis of influenza from Nasopharyngeal swab specimens and should not be used as a sole basis for treatment. Nasal washings and aspirates are unacceptable for Xpert Xpress SARS-CoV-2/FLU/RSV testing.  Fact Sheet for Patients: BloggerCourse.com  Fact Sheet for Healthcare Providers: SeriousBroker.it  This test is not yet approved or cleared by the [place] FDA and has been authorized for detection and/or diagnosis of SARS-CoV-2 by FDA under an Emergency Use Authorization (EUA). This EUA will remain in effect (meaning this test can be used) for the duration of the COVID-19 declaration under Section 564(b)(1) of the Act, 21 U.S.C. section 360bbb-3(b)(1), unless the authorization is terminated or revoked.  Performed at [facility], [address], [place], [place] [address]       Radiology Studies: No results found.     LOS: 5 days   Tashe Purdon [employer] on [email or web address]  07/05/2020, 10:25 AM

## 2020-07-06 DIAGNOSIS — J441 Chronic obstructive pulmonary disease with (acute) exacerbation: Secondary | ICD-10-CM | POA: Diagnosis not present

## 2020-07-06 LAB — GLUCOSE, CAPILLARY
Glucose-Capillary: 150 mg/dL — ABNORMAL HIGH (ref 70–99)
Glucose-Capillary: 122 mg/dL — ABNORMAL HIGH (ref 70–99)
Glucose-Capillary: 206 mg/dL — ABNORMAL HIGH (ref 70–99)
Glucose-Capillary: 145 mg/dL — ABNORMAL HIGH (ref 70–99)

## 2020-07-06 LAB — BASIC METABOLIC PANEL
Anion gap: 11 (ref 5–15)
BUN: 14 mg/dL (ref 8–23)
CO2: 35 mmol/L — ABNORMAL HIGH (ref 22–32)
Calcium: 8.3 mg/dL — ABNORMAL LOW (ref 8.9–10.3)
Chloride: 93 mmol/L — ABNORMAL LOW (ref 98–111)
Creatinine, Ser: 0.49 mg/dL (ref 0.44–1.00)
GFR, Estimated: >60 mL/min (ref >60)
Glucose, Bld: 238 mg/dL — ABNORMAL HIGH (ref 70–99)
Potassium: 4.3 mmol/L (ref 3.5–5.1)
Sodium: 139 mmol/L (ref 135–145)

## 2020-07-06 MED ORDER — VALPROIC ACID 250 MG/5ML PO SOLN
500.0000 mg | Freq: Three times a day (TID) | ORAL | Status: DC
Start: 2020-07-06 — End: 2020-07-06
  Administered 2020-07-06 (×2): 500 mg via ORAL
  Filled 2020-07-06 (×3): qty 10

## 2020-07-06 MED ORDER — HALOPERIDOL LACTATE 5 MG/ML IJ SOLN: 1.0000 mg | Freq: Four times a day (QID) | INTRAMUSCULAR | Status: DC | PRN

## 2020-07-06 MED ORDER — DIVALPROEX SODIUM 125 MG PO CSDR
500.0000 mg | DELAYED_RELEASE_CAPSULE | Freq: Three times a day (TID) | ORAL | Status: DC
  Administered 2020-07-07 – 2020-07-10 (×10): 500 mg via ORAL
  Filled 2020-07-06 (×13): qty 4

## 2020-07-06 NOTE — TOC Progression Note (Addendum)
Transition of Care [facility]) - Progression Note    Patient Details  Name: Stephanie Rice MRN: [id number] Date of Birth: 13-Aug-1955  [facility] CM/SW Contact  [name], LCSW Phone Number: 07/06/2020, 2:52 PM  Clinical Narrative:   Pt continuing to monitor for additional bed offers.  Currently [facility] is only bed offer.  CSW contacted the following SNF still pending in hub: [facility]: [facility] cannot offer bed [facility]: [facility] cannot accept to to pt being unvaccinated and she has no isolation beds. [facility]: [facility] cannot offer bed.    Expected Discharge Plan: Skilled Nursing Facility Barriers to Discharge: Continued Medical Work up  Expected Discharge Plan and Services Expected Discharge Plan: Skilled Nursing Facility     Post Acute Care Choice:  (daughter requesting LTAC) Living arrangements for the past 2 months: Skilled Nursing Facility (was hospitalized at [place] for 30+ days prior to SNF)                                       Social Determinants of Health (SDOH) Interventions    Readmission Risk Interventions No flowsheet data found.

## 2020-07-06 NOTE — Progress Notes (Signed)
  Speech Language Pathology Treatment: Dysphagia  Patient Details Name: Stephanie Rice MRN: [id number] DOB: 11-28-1955 Today's Date: 07/06/2020 Time: 6295-2841 SLP Time Calculation (min) (ACUTE ONLY): 21 min  Assessment / Plan / Recommendation Clinical Impression  Pt was seen for skilled ST targeting diet tolerance.  She was encountered awake in bed with bilateral mitts in place, and with nasal cannula on top of her head.  Nasal cannula was placed in her nares.  Pt was seen with trials of Dysphagia 1 (puree) solids, Dysphagia 2 (ground) solids, and honey-thick liquid from her lunch meal tray.  She was observed to have mildly prolonged mastication of Dysphagia 2 solids with minimal oral residue observed following swallow initiation.  Pt required intermittent cues to initiate a swallow with Dysphagia 2 solids.  She tolerated 4oz of Dysphagia 1 solids with good bolus acceptance and suspected timely AP transport.  No oral residue was observed with puree trials.  Pt additionally consumed honey-thick liquid via straw.  She exhibited an immediate cough x1 following consecutive sips, otherwise she tolerated 3oz without overt s/sx of aspiration.  Pt benefited from verbal and tactile cues to limit bolus size and rate of intake with liquid trials.  Suspect that Dysphagia 1 (puree) solids may be the safest long term solid diet for this patient; however, at this time she is tolerating Dysphagia 2 solids with use of compensatory strategies and supervision.  Therefore, recommend continuation of Dysphagia 2 solids and honey-thick liquids with medication administered crushed in puree.  SLP will f/u per POC.    HPI HPI: Pt is a 65 y.o. female with medical history significant of chronic hypoxic and hypercapnic respiratory failure secondary COPD, on Trelegy PRN +2 L PRN, HTN, recent lydiagnosed DVT, morbid obesity, IDDM, Dementia, bipolar disorder, who presented with altered mentation. CT head was negative. CXR 2/15: Right  middle lobe consolidation most compatible with pneumonia. MD considered this to be residue imaging changes from a PNA pt had last month, vs a new infection and coverage was expanded for aspiration. Per EMR, pt's daughter has reported that pt has chronic dysphagia on pureed diet, with strict instruction of sitting up 30 min before feed.      SLP Plan  Continue with current plan of care       Recommendations  Diet recommendations: Dysphagia 2 (fine chop);Honey-thick liquid Liquids provided via: Cup;Straw Medication Administration: Crushed with puree Supervision: Full supervision/cueing for compensatory strategies;Staff to assist with self feeding Compensations: Small sips/bites;Slow rate;Multiple dry swallows after each bite/sip Postural Changes and/or Swallow Maneuvers: Seated upright 90 degrees                Oral Care Recommendations: Oral care BID Follow up Recommendations: Skilled Nursing facility SLP Visit Diagnosis: Dysphagia, oropharyngeal phase (R13.12) Plan: Continue with current plan of care       GO               Villa Herb M.S., CCC-SLP Acute Rehabilitation Services Office: [phone]  Shanon Rosser [facility] 07/06/2020, 12:17 PM

## 2020-07-06 NOTE — Progress Notes (Signed)
[facility] PROGRESS NOTE   Stephanie RobertDeborah Rice ZOX:[id number] DOB: 1956-04-21 DOA: 06/30/2020  PCP: Stephanie Rice, Stephanie R, PA-C  Brief History/Interval Summary: 65 y.o. female with medical history significant of chronic hypoxic and hypercapnic respiratory failure secondary COPD, on Trelegy PRN +2 L PRN, HTN, recent diagnosed DVT, morbid obesity, IDDM, Dementia, bipolar disorder, presented with altered mentation.  Patient was found to have hypercapnic respiratory failure.  Was hospitalized for further management.  Consultants: None  Procedures: None yet  Antibiotics: Anti-infectives (From admission, onward)   Start     Dose/Rate Route Frequency Ordered Stop   07/03/20 1400  cephALEXin (KEFLEX) capsule 500 mg        500 mg Oral Every 8 hours 07/03/20 0954 07/07/20 1359   06/30/20 2200  doxycycline (VIBRA-TABS) tablet 100 mg        100 mg Oral Every 12 hours 06/30/20 1612 07/05/20 2159   06/30/20 2030  cefTRIAXone (ROCEPHIN) 2 g in sodium chloride 0.9 % 100 mL IVPB  Status:  Discontinued        2 g 200 mL/hr over 30 Minutes Intravenous Daily at bedtime 06/30/20 2006 07/03/20 0954   06/30/20 1800  Ampicillin-Sulbactam (UNASYN) 3 g in sodium chloride 0.9 % 100 mL IVPB  Status:  Discontinued        3 g 200 mL/hr over 30 Minutes Intravenous Every 6 hours 06/30/20 1703 06/30/20 1954      Subjective/Interval History: Overnight events noted.  Patient refused to wear her BiPAP last night.  She was more belligerent.  Noted to be angry this morning.  Not answering questions.    Assessment/Plan:  Acute metabolic encephalopathy Likely secondary to hypercapnia along with the urinary tract infection.  No focal deficits noted.  CT head was reassuring.  Patient with known history of bipolar disorder and dementia.   Mentation seems to be back to baseline.  She is experiencing some behavioral issues.  Acute COPD exacerbation with acute on chronic hypoxic and hypercapnic respiratory  failure/community-acquired pneumonia Patient was placed on BiPAP.  Respiratory status appears to be stable.   Continue BiPAP every night during daytime hours if she looks somnolent.  Patient apparently on trilogy vent prior to admission. Steroids being tapered.  Doxycycline given for 5 days.  No wheezing appreciated.  Nebulizer treatment as needed.  ABG showed improvement in pH.  Influenza and COVID-19 test were negative. Apparently none of the skilled nursing facilities take patients who are on trilogy vent.  Patient can be discharged with BiPAP however.  Discussed with daughter who is agreeable to this.  Atrial fibrillation with RVR Patient went into rapid atrial fibrillation on the morning of 2/17.  Was given Cardizem and metoprolol.  Converted back to sinus rhythm.  Was placed on metoprolol.   Echocardiogram showed normal systolic function.  Mild LVH was noted.  No significant valvular abnormalities noted.   TSH noted to be normal.  Troponins were normal.  Patient already on Eliquis for history of DVT. Remains stable.  Maintaining sinus rhythm.  Continue metoprolol.  Urinary tract infection/urinary retention Patient was initially on ceftriaxone.  Urine culture with multiple species.  Patient transitioned to Keflex.  Should complete course soon.Marland Kitchen. Has had urinary retention occasionally.  This is most likely due to poor mobility.   She underwent in and out catheterization on 2/18.  Has not required any in and out catheterization in the last 48 hours.   Continue bladder scans  Oropharyngeal dysphagia Seen by speech therapy.  On dysphagia 2  diet with honey thick liquids.  Mild hyperkalemia Corrected with Lokelma.    Essential hypertension Initially clonidine was held due to low blood pressures.  Patient placed on metoprolol for A. fib.   Blood pressures then went into the hypertensive range possibly due to rebound hypertension.   Subsequently placed back on clonidine.  Dose was adjusted  yesterday.  Blood pressures seem to be better.    Diabetes mellitus type 2, on insulin, uncontrolled with hyperglycemia/Diabetic neuropathy HbA1c 7.2.  Patient remains on Levemir and SSI.  Continue to monitor.  CBGs are reasonably well controlled.  History of bipolar disorder Patient on valproic acid and loxapine.  Medication reconciliation was done and apparently patient has not been on Topamax which was discontinued.  Not on gabapentin either which was also discontinued.  Dose of melatonin was decreased.  Steroids could be contributing to her occasional agitation.  Steroids to be tapered. More aggressive behavior noted over the last 24 hours. She is noted to be on valproic acid.  Will increase the dose.  History of DVT Continue Eliquis.  Morbid obesity Estimated body mass index is 43.75 kg/m as calculated from the following:   Height as of 02/04/16: 5\' 2"  (1.575 m).   Weight as of 02/04/16: 108.5 kg.    DVT Prophylaxis: On Eliquis Code Status: DNR Family Communication: No family at bedside.  Daughter was updated yesterday. Disposition Plan: Plan is for patient to go to SNF with BiPAP.  Status is: Inpatient  Remains inpatient appropriate because:Altered mental status, IV treatments appropriate due to intensity of illness or inability to take PO and Inpatient level of care appropriate due to severity of illness   Dispo: The patient is from: SNF              Anticipated d/c is to: SNF              Anticipated d/c date is: 3 days              Patient currently is not medically stable to d/c.   Difficult to place patient No      Medications:  Scheduled: . apixaban  5 mg Oral BID  . atorvastatin  20 mg Oral QHS  . cephALEXin  500 mg Oral Q8H  . cloNIDine  0.2 mg Oral BID  . fluticasone furoate-vilanterol  1 puff Inhalation Daily  . insulin aspart  0-20 Units Subcutaneous TID WC  . insulin aspart  0-5 Units Subcutaneous QHS  . insulin detemir  25 Units Subcutaneous Daily  .  loxapine  20 mg Oral QHS  . melatonin  5 mg Oral QHS  . metoprolol tartrate  12.5 mg Oral BID  . pantoprazole  40 mg Oral Daily  . polyvinyl alcohol  1 drop Both Eyes Q breakfast  . predniSONE  20 mg Oral Q breakfast  . valproic acid  250 mg Oral QID   Continuous:  02/06/16, albuterol, clonazepam, haloperidol lactate, hydrALAZINE, Resource ThickenUp Clear, traMADol   Objective:  Vital Signs  Vitals:   07/06/20 0419 07/06/20 0731 07/06/20 0831 07/06/20 0832  BP: (!) 109/56 128/71    Pulse: (!) 56 66  71  Resp: 20 18  17   Temp:      TempSrc:      SpO2: 100% 99% 100% 100%    Intake/Output Summary (Last 24 hours) at 07/06/2020 0945 Last data filed at 07/05/2020 2001 Gross per 24 hour  Intake -  Output 400 ml  Net -400 ml  There were no vitals filed for this visit.    General appearance: Awake alert.  In no distress.  Noted to be angry this morning.  Not answering questions.  Pushing me away.  Unable to examine due to aggressive behavior.       Lab Results:  Data Reviewed: I have personally reviewed following labs and imaging studies  CBC: Recent Labs  Lab 06/30/20 1226 06/30/20 1516 07/01/20 0956 07/02/20 0220 07/03/20 0703  WBC 4.0  --  3.3* 5.1 4.5  NEUTROABS 1.8  --   --   --   --   HGB 12.0 13.3 12.2 12.6 12.0  HCT 43.1 39.0 40.4 41.9 39.9  MCV 105.9*  --  101.8* 101.5* 102.3*  PLT 209  --  207 208 208    Basic Metabolic Panel: Recent Labs  Lab 07/02/20 0220 07/03/20 0425 07/04/20 0136 07/05/20 0351 07/06/20 0235  NA 141 143 140 139 139  K 5.4* 4.4 4.2 3.8 4.3  CL 97* 97* 94* 93* 93*  CO2 33* 34* 39* 38* 35*  GLUCOSE 215* 170* 296* 242* 238*  BUN 22 25* 18 11 14   CREATININE 0.50 0.41* 0.46 0.40* 0.49  CALCIUM 8.5* 8.6* 8.4* 8.7* 8.3*  MG 2.0  --   --   --   --     GFR: CrCl cannot be calculated (Unknown ideal weight.).  Liver Function Tests: Recent Labs  Lab 06/30/20 1226  AST 15  ALT 14  ALKPHOS 64  BILITOT 0.5   PROT 6.0*  ALBUMIN 2.4*    Recent Labs  Lab 06/30/20 1226  LIPASE 19    CBG: Recent Labs  Lab 07/05/20 0735 07/05/20 1140 07/05/20 1613 07/05/20 2022 07/06/20 0841  GLUCAP 172* 226* 272* 243* 150*      Recent Results (from the past 240 hour(s))  Resp Panel by RT-PCR (Flu A&B, Covid) Nasopharyngeal Swab     Status: None   Collection Time: 06/30/20  3:57 PM   Specimen: Nasopharyngeal Swab; Nasopharyngeal(NP) swabs in vial transport medium  Result Value Ref Range Status   SARS Coronavirus 2 by RT PCR NEGATIVE NEGATIVE Final    Comment: (NOTE) SARS-CoV-2 target nucleic acids are NOT DETECTED.  The SARS-CoV-2 RNA is generally detectable in upper respiratory specimens during the acute phase of infection. The lowest concentration of SARS-CoV-2 viral copies this assay can detect is 138 copies/mL. A negative result does not preclude SARS-Cov-2 infection and should not be used as the sole basis for treatment or other patient management decisions. A negative result may occur with  improper specimen collection/handling, submission of specimen other than nasopharyngeal swab, presence of viral mutation(s) within the areas targeted by this assay, and inadequate number of viral copies(<138 copies/mL). A negative result must be combined with clinical observations, patient history, and epidemiological information. The expected result is Negative.  Fact Sheet for Patients:  07/02/20  Fact Sheet for Healthcare Providers:  BloggerCourse.com  This test is no t yet approved or cleared by the [place] FDA and  has been authorized for detection and/or diagnosis of SARS-CoV-2 by FDA under an Emergency Use Authorization (EUA). This EUA will remain  in effect (meaning this test can be used) for the duration of the COVID-19 declaration under Section 564(b)(1) of the Act, 21 U.S.C.section 360bbb-3(b)(1), unless the  authorization is terminated  or revoked sooner.       Influenza A by PCR NEGATIVE NEGATIVE Final   Influenza B by PCR NEGATIVE NEGATIVE Final  Comment: (NOTE) The Xpert Xpress SARS-CoV-2/FLU/RSV plus assay is intended as an aid in the diagnosis of influenza from Nasopharyngeal swab specimens and should not be used as a sole basis for treatment. Nasal washings and aspirates are unacceptable for Xpert Xpress SARS-CoV-2/FLU/RSV testing.  Fact Sheet for Patients: BloggerCourse.com  Fact Sheet for Healthcare Providers: SeriousBroker.it  This test is not yet approved or cleared by the [place] FDA and has been authorized for detection and/or diagnosis of SARS-CoV-2 by FDA under an Emergency Use Authorization (EUA). This EUA will remain in effect (meaning this test can be used) for the duration of the COVID-19 declaration under Section 564(b)(1) of the Act, 21 U.S.C. section 360bbb-3(b)(1), unless the authorization is terminated or revoked.  Performed at [facility], [address] [place], [place] [address]       Radiology Studies: No results found.     LOS: 6 days   Alexey Rhoads [employer] on [email or web address]  07/06/2020, 9:45 AM

## 2020-07-07 DIAGNOSIS — F0391 Unspecified dementia with behavioral disturbance: Secondary | ICD-10-CM | POA: Diagnosis not present

## 2020-07-07 DIAGNOSIS — F3160 Bipolar disorder, current episode mixed, unspecified: Secondary | ICD-10-CM | POA: Diagnosis not present

## 2020-07-07 DIAGNOSIS — E1165 Type 2 diabetes mellitus with hyperglycemia: Secondary | ICD-10-CM | POA: Diagnosis not present

## 2020-07-07 DIAGNOSIS — J441 Chronic obstructive pulmonary disease with (acute) exacerbation: Secondary | ICD-10-CM | POA: Diagnosis not present

## 2020-07-07 LAB — BLOOD GAS, ARTERIAL
Acid-Base Excess: 16.8 mmol/L — ABNORMAL HIGH (ref 0.0–2.0)
Bicarbonate: 43.1 mmol/L — ABNORMAL HIGH (ref 20.0–28.0)
Drawn by: 59156
FIO2: 40
O2 Saturation: 97 %
Patient temperature: 37.1
pCO2 arterial: 79.2 mmHg (ref 32.0–48.0)
pH, Arterial: 7.356 (ref 7.350–7.450)
pO2, Arterial: 94.4 mmHg (ref 83.0–108.0)

## 2020-07-07 LAB — GLUCOSE, CAPILLARY
Glucose-Capillary: 190 mg/dL — ABNORMAL HIGH (ref 70–99)
Glucose-Capillary: 125 mg/dL — ABNORMAL HIGH (ref 70–99)
Glucose-Capillary: 172 mg/dL — ABNORMAL HIGH (ref 70–99)
Glucose-Capillary: 188 mg/dL — ABNORMAL HIGH (ref 70–99)
Glucose-Capillary: 207 mg/dL — ABNORMAL HIGH (ref 70–99)

## 2020-07-07 LAB — VALPROIC ACID LEVEL: Valproic Acid Lvl: 72 ug/mL (ref 50.0–100.0)

## 2020-07-07 NOTE — Progress Notes (Signed)
[facility] PROGRESS NOTE   Stephanie Rice Chronister [id number] DOB: 02/11/1956 DOA: 06/30/2020  PCP: Coralee Ruduran, Michael R, PA-C  Brief History/Interval Summary: 65 y.o. female with medical history significant of chronic hypoxic and hypercapnic respiratory failure secondary COPD, on Trelegy PRN +2 L PRN, HTN, recent diagnosed DVT, morbid obesity, IDDM, Dementia, bipolar disorder, presented with altered mentation.  Patient was found to have hypercapnic respiratory failure.  Was hospitalized for further management.  Consultants: None  Procedures: None yet  Antibiotics: Anti-infectives (From admission, onward)   Start     Dose/Rate Route Frequency Ordered Stop   07/03/20 1400  cephALEXin (KEFLEX) capsule 500 mg        500 mg Oral Every 8 hours 07/03/20 0954 07/07/20 1359   06/30/20 2200  doxycycline (VIBRA-TABS) tablet 100 mg        100 mg Oral Every 12 hours 06/30/20 1612 07/05/20 2159   06/30/20 2030  cefTRIAXone (ROCEPHIN) 2 g in sodium chloride 0.9 % 100 mL IVPB  Status:  Discontinued        2 g 200 mL/hr over 30 Minutes Intravenous Daily at bedtime 06/30/20 2006 07/03/20 0954   06/30/20 1800  Ampicillin-Sulbactam (UNASYN) 3 g in sodium chloride 0.9 % 100 mL IVPB  Status:  Discontinued        3 g 200 mL/hr over 30 Minutes Intravenous Every 6 hours 06/30/20 1703 06/30/20 1954      Subjective/Interval History: Patient noted to be on BiPAP this morning.  She is sound asleep.  Apparently ordered only very late at night/early morning.     Assessment/Plan:  Acute metabolic encephalopathy Likely secondary to hypercapnia along with the urinary tract infection.  No focal deficits noted.  CT head was reassuring.  Patient with known history of bipolar disorder and dementia.   Mentation appears to be waxing and waning.  Records from her recent hospitalization few months ago and [place] were reviewed.  She also had waxing and waning mentation there.  According to [facility] she  actually underwent MRI of her brain  in January which did not show any acute process.  Some of her alteration in mental status probably behavioral in nature.  She does have a history of bipolar disorder.  She is on Depakote as discussed below.  May benefit from involving psychiatry to assist with management.  Will consult them. We will also check another ABG.  Acute COPD exacerbation with acute on chronic hypoxic and hypercapnic respiratory failure/community-acquired pneumonia Patient was placed on BiPAP. Continue BiPAP every night during daytime hours if she looks somnolent.  Patient apparently on trilogy vent prior to admission. Steroids being tapered.  Doxycycline given for 5 days.  No wheezing appreciated.  Nebulizer treatment as needed.  ABG showed improvement in pH.  Influenza and COVID-19 test were negative. Apparently none of the skilled nursing facilities take patients who are on trilogy vent.  Patient can be discharged with BiPAP however.  Discussed with daughter who is agreeable to this. Will repeat ABG today.  Atrial fibrillation with RVR Patient went into rapid atrial fibrillation on the morning of 2/17.  Was given Cardizem and metoprolol.  Converted back to sinus rhythm.  Was placed on metoprolol.  Likely triggered by her respiratory illness. Echocardiogram showed normal systolic function.  Mild LVH was noted.  No significant valvular abnormalities noted.   TSH noted to be normal.  Troponins were normal.  Patient already on Eliquis for history of DVT. Remains stable.  Maintaining sinus rhythm.  Continue  metoprolol.  Urinary tract infection/urinary retention Patient was initially on ceftriaxone.  Urine culture with multiple species.  Patient transitioned to Keflex.  Will complete course today. Has had urinary retention occasionally.  This is most likely due to poor mobility.  She underwent in and out catheterization on 2/18.  Has not required any in and out catheterization in the last  3 days.   Continue bladder scans  Oropharyngeal dysphagia Seen by speech therapy.  On dysphagia 2 diet with honey thick liquids.  Mild hyperkalemia Corrected with Lokelma.    Essential hypertension Initially clonidine was held due to low blood pressures.  Patient placed on metoprolol for A. fib.   Blood pressures then went into the hypertensive range possibly due to rebound hypertension.   Subsequently placed back on clonidine.  Her noted to be better controlled.  Continue to monitor.   Blood pressure tends to increase when she is agitated.  Diabetes mellitus type 2, on insulin, uncontrolled with hyperglycemia/Diabetic neuropathy HbA1c 7.2.  Patient remains on Levemir and SSI.  Continue to monitor.  CBGs are reasonably well controlled.  History of bipolar disorder Patient on valproic acid and loxapine.  Medication reconciliation was done and apparently patient has not been on Topamax which was discontinued.  Not on gabapentin either which was also discontinued.  Dose of melatonin was decreased.  Patient has had episodes of aggression over the last 2 days.  Depakote dose was increased yesterday.  Level noted to be 72.  Will consult psychiatry to assist with management.  History of DVT Continue Eliquis.  Morbid obesity Estimated body mass index is 43.75 kg/m as calculated from the following:   Height as of 02/04/16: 5\' 2"  (1.575 m).   Weight as of 02/04/16: 108.5 kg.    DVT Prophylaxis: On Eliquis Code Status: DNR Family Communication: No family at bedside.  Daughter being updated periodically. Disposition Plan: Plan is for patient to go to SNF with BiPAP.  Status is: Inpatient  Remains inpatient appropriate because:Altered mental status, IV treatments appropriate due to intensity of illness or inability to take PO and Inpatient level of care appropriate due to severity of illness   Dispo: The patient is from: SNF              Anticipated d/c is to: SNF               Anticipated d/c date is: 3 days              Patient currently is not medically stable to d/c.   Difficult to place patient No      Medications:  Scheduled: . apixaban  5 mg Oral BID  . atorvastatin  20 mg Oral QHS  . cephALEXin  500 mg Oral Q8H  . cloNIDine  0.2 mg Oral BID  . divalproex  500 mg Oral Q8H  . fluticasone furoate-vilanterol  1 puff Inhalation Daily  . insulin aspart  0-20 Units Subcutaneous TID WC  . insulin aspart  0-5 Units Subcutaneous QHS  . insulin detemir  25 Units Subcutaneous Daily  . loxapine  20 mg Oral QHS  . melatonin  5 mg Oral QHS  . metoprolol tartrate  12.5 mg Oral BID  . pantoprazole  40 mg Oral Daily  . polyvinyl alcohol  1 drop Both Eyes Q breakfast  . predniSONE  20 mg Oral Q breakfast   Continuous:  02/06/16, albuterol, clonazepam, haloperidol lactate, hydrALAZINE, Resource ThickenUp Clear, traMADol   Objective:  Vital Signs  Vitals:   07/07/20 0623 07/07/20 0624 07/07/20 0637 07/07/20 0800  BP:    (!) 129/57  Pulse:    75  Resp:    15  Temp:    98.8 F (37.1 C)  TempSrc:    Axillary  SpO2: (!) 76% 91% 100% 100%    Intake/Output Summary (Last 24 hours) at 07/07/2020 1028 Last data filed at 07/06/2020 1954 Gross per 24 hour  Intake 480 ml  Output 400 ml  Net 80 ml   There were no vitals filed for this visit.    General appearance: She is asleep on BiPAP.  Easily arousable.  Somewhat agitated. Resp: Clear to auscultation bilaterally.  Normal effort Cardio: S1-S2 is normal regular.  No S3-S4.  No rubs murmurs or bruit GI: Abdomen is soft.  Nontender nondistended.  Bowel sounds are present normal.  No masses organomegaly Noted to be moving all of her extremities      Lab Results:  Data Reviewed: I have personally reviewed following labs and imaging studies  CBC: Recent Labs  Lab 06/30/20 1226 06/30/20 1516 07/01/20 0956 07/02/20 0220 07/03/20 0703  WBC 4.0  --  3.3* 5.1 4.5  NEUTROABS 1.8  --   --    --   --   HGB 12.0 13.3 12.2 12.6 12.0  HCT 43.1 39.0 40.4 41.9 39.9  MCV 105.9*  --  101.8* 101.5* 102.3*  PLT 209  --  207 208 208    Basic Metabolic Panel: Recent Labs  Lab 07/02/20 0220 07/03/20 0425 07/04/20 0136 07/05/20 0351 07/06/20 0235  NA 141 143 140 139 139  K 5.4* 4.4 4.2 3.8 4.3  CL 97* 97* 94* 93* 93*  CO2 33* 34* 39* 38* 35*  GLUCOSE 215* 170* 296* 242* 238*  BUN 22 25* 18 11 14   CREATININE 0.50 0.41* 0.46 0.40* 0.49  CALCIUM 8.5* 8.6* 8.4* 8.7* 8.3*  MG 2.0  --   --   --   --     GFR: CrCl cannot be calculated (Unknown ideal weight.).  Liver Function Tests: Recent Labs  Lab 06/30/20 1226  AST 15  ALT 14  ALKPHOS 64  BILITOT 0.5  PROT 6.0*  ALBUMIN 2.4*    Recent Labs  Lab 06/30/20 1226  LIPASE 19    CBG: Recent Labs  Lab 07/06/20 0841 07/06/20 1211 07/06/20 1609 07/06/20 2018 07/07/20 0818  GLUCAP 150* 122* 206* 145* 190*      Recent Results (from the past 240 hour(s))  Resp Panel by RT-PCR (Flu A&B, Covid) Nasopharyngeal Swab     Status: None   Collection Time: 06/30/20  3:57 PM   Specimen: Nasopharyngeal Swab; Nasopharyngeal(NP) swabs in vial transport medium  Result Value Ref Range Status   SARS Coronavirus 2 by RT PCR NEGATIVE NEGATIVE Final    Comment: (NOTE) SARS-CoV-2 target nucleic acids are NOT DETECTED.  The SARS-CoV-2 RNA is generally detectable in upper respiratory specimens during the acute phase of infection. The lowest concentration of SARS-CoV-2 viral copies this assay can detect is 138 copies/mL. A negative result does not preclude SARS-Cov-2 infection and should not be used as the sole basis for treatment or other patient management decisions. A negative result may occur with  improper specimen collection/handling, submission of specimen other than nasopharyngeal swab, presence of viral mutation(s) within the areas targeted by this assay, and inadequate number of viral copies(<138 copies/mL). A negative  result must be combined with clinical observations, patient history, and epidemiological information. The expected  result is Negative.  Fact Sheet for Patients:  BloggerCourse.com  Fact Sheet for Healthcare Providers:  SeriousBroker.it  This test is no t yet approved or cleared by the [place] FDA and  has been authorized for detection and/or diagnosis of SARS-CoV-2 by FDA under an Emergency Use Authorization (EUA). This EUA will remain  in effect (meaning this test can be used) for the duration of the COVID-19 declaration under Section 564(b)(1) of the Act, 21 U.S.C.section 360bbb-3(b)(1), unless the authorization is terminated  or revoked sooner.       Influenza A by PCR NEGATIVE NEGATIVE Final   Influenza B by PCR NEGATIVE NEGATIVE Final    Comment: (NOTE) The Xpert Xpress SARS-CoV-2/FLU/RSV plus assay is intended as an aid in the diagnosis of influenza from Nasopharyngeal swab specimens and should not be used as a sole basis for treatment. Nasal washings and aspirates are unacceptable for Xpert Xpress SARS-CoV-2/FLU/RSV testing.  Fact Sheet for Patients: BloggerCourse.com  Fact Sheet for Healthcare Providers: SeriousBroker.it  This test is not yet approved or cleared by the [place] FDA and has been authorized for detection and/or diagnosis of SARS-CoV-2 by FDA under an Emergency Use Authorization (EUA). This EUA will remain in effect (meaning this test can be used) for the duration of the COVID-19 declaration under Section 564(b)(1) of the Act, 21 U.S.C. section 360bbb-3(b)(1), unless the authorization is terminated or revoked.  Performed at [facility], [address], [place], [place] [address]       Radiology Studies: No results found.     LOS: 7 days   Gerlene Glassburn [employer] on [email or web address]  07/07/2020, 10:28  AM

## 2020-07-07 NOTE — Progress Notes (Signed)
PT Cancellation Note  Patient Details Name: Stephanie Rice MRN: [id number] DOB: 22-Mar-1956   Cancelled Treatment:    Reason Eval/Treat Not Completed: Medical issues which prohibited therapy  Patient sleeping and wearing BiPAP. RN reports she did not sleep all night and just recently went to sleep. Will reattempt later today as schedule permits.   Jerolyn Center, PT Pager [phone]   Zena Amos 07/07/2020, 9:15 AM

## 2020-07-07 NOTE — Progress Notes (Addendum)
Nutrition Follow Up  DOCUMENTATION CODES:   Not applicable  INTERVENTION:    Magic cup TID with meals, each supplement provides 290 kcal and 9 grams of protein  MVI daily   NUTRITION DIAGNOSIS:   Increased nutrient needs related to acute illness as evidenced by estimated needs.  Ongoing  GOAL:   Patient will meet greater than or equal to 90% of their needs   Progressing   MONITOR:   PO intake,Diet advancement,Supplement acceptance,Labs,Weight trends,I & O's  REASON FOR ASSESSMENT:   Consult Assessment of nutrition requirement/status  ASSESSMENT:   Patient with PMH significant for chronic hypoxic and hypercapnic respiratory failure 2/2 to COPD, HTN, recent diagnosis of DVT, DM, dementia, and bipolar disorder. Presents this admission with COPD exacerbation.  Requiring BiPAP at night. Diet advanced to dysphagia 2 honey thick liquids on 2/27. Patient has great appetite. Last two meal completions charted as 100%. RD to provide honey thick supplement to maximize kcal and protein this admission.   UOP: 400 ml x 24 hrs   Medications: SS novolog, levemir, prednisone Labs: CBG 150-272  Flowsheet Row Most Recent Value  Orbital Region No depletion  Upper Arm Region No depletion  Thoracic and Lumbar Region Unable to assess  Buccal Region No depletion  Temple Region No depletion  Clavicle Bone Region No depletion  Clavicle and Acromion Bone Region No depletion  Scapular Bone Region Unable to assess  Dorsal Hand No depletion  Patellar Region No depletion  Anterior Thigh Region No depletion  Posterior Calf Region No depletion  Edema (RD Assessment) None  Hair Reviewed  Eyes Reviewed  Mouth Reviewed  Skin Reviewed  Nails Reviewed     Diet Order:   Diet Order            DIET DYS 2 Room service appropriate? No; Fluid consistency: Honey Thick  Diet effective now                 EDUCATION NEEDS:   Not appropriate for education at this time  Skin:  Skin  Assessment: Reviewed RN Assessment  Last BM:  2/20  Height:   Ht Readings from Last 1 Encounters:  02/04/16 5\' 2"  (1.575 m)    Weight:   Wt Readings from Last 1 Encounters:  02/04/16 108.5 kg    BMI:  There is no height or weight on file to calculate BMI.  Estimated Nutritional Needs:   Kcal:  1800-2000 kcal  Protein:  90-105 grams  Fluid:  >/= 1.8 L/day  02/06/16 RD, LDN Clinical Nutrition Pager listed in [facility]

## 2020-07-07 NOTE — Progress Notes (Signed)
Pt is wearing mittens and can not go on bipap tonight.

## 2020-07-07 NOTE — TOC Progression Note (Signed)
Transition of Care [facility]) - Progression Note    Patient Details  Name: Stephanie Rice MRN: [id number] Date of Birth: [birth year]  Transition of [facility]) CM/SW Contact  [name], LCSW Phone Number: 07/07/2020, 1:55 PM  Clinical Narrative:   CSW spoke with pt daughter Stephanie Rice, discussed one current bed offer from [facility] and a number of declines based on some of pt current behaviors.  Stephanie Rice asked that we see if any other offers come in.    Expected Discharge Plan: Skilled Nursing Facility Barriers to Discharge: Continued Medical Work up  Expected Discharge Plan and Services Expected Discharge Plan: Skilled Nursing Facility     Post Acute Care Choice:  (daughter requesting LTAC) Living arrangements for the past 2 months: Skilled Nursing Facility (was hospitalized at [facility] for 30+ days prior to SNF)                                       Social Determinants of Health (SDOH) Interventions    Readmission Risk Interventions No flowsheet data found.

## 2020-07-07 NOTE — Plan of Care (Signed)
  Problem: Clinical Measurements: Goal: Respiratory complications will improve Outcome: Progressing   Problem: Clinical Measurements: Goal: Cardiovascular complication will be avoided Outcome: Progressing   Problem: Coping: Goal: Level of anxiety will decrease Outcome: Progressing   

## 2020-07-08 DIAGNOSIS — E1165 Type 2 diabetes mellitus with hyperglycemia: Secondary | ICD-10-CM | POA: Diagnosis not present

## 2020-07-08 DIAGNOSIS — I10 Essential (primary) hypertension: Secondary | ICD-10-CM | POA: Diagnosis not present

## 2020-07-08 DIAGNOSIS — F319 Bipolar disorder, unspecified: Secondary | ICD-10-CM

## 2020-07-08 DIAGNOSIS — I4891 Unspecified atrial fibrillation: Secondary | ICD-10-CM

## 2020-07-08 LAB — COMPREHENSIVE METABOLIC PANEL
ALT: 22 U/L (ref 0–44)
AST: 20 U/L (ref 15–41)
Albumin: 2.4 g/dL — ABNORMAL LOW (ref 3.5–5.0)
Alkaline Phosphatase: 47 U/L (ref 38–126)
Anion gap: 11 (ref 5–15)
BUN: 18 mg/dL (ref 8–23)
CO2: 37 mmol/L — ABNORMAL HIGH (ref 22–32)
Calcium: 8.3 mg/dL — ABNORMAL LOW (ref 8.9–10.3)
Chloride: 93 mmol/L — ABNORMAL LOW (ref 98–111)
Creatinine, Ser: <0.3 mg/dL — ABNORMAL LOW (ref 0.44–1.00)
Glucose, Bld: 204 mg/dL — ABNORMAL HIGH (ref 70–99)
Potassium: 4.2 mmol/L (ref 3.5–5.1)
Sodium: 141 mmol/L (ref 135–145)
Total Bilirubin: 0.8 mg/dL (ref 0.3–1.2)
Total Protein: 4.9 g/dL — ABNORMAL LOW (ref 6.5–8.1)

## 2020-07-08 LAB — CBC
HCT: 38.4 % (ref 36.0–46.0)
Hemoglobin: 11.4 g/dL — ABNORMAL LOW (ref 12.0–15.0)
MCH: 30.1 pg (ref 26.0–34.0)
MCHC: 29.7 g/dL — ABNORMAL LOW (ref 30.0–36.0)
MCV: 101.3 fL — ABNORMAL HIGH (ref 80.0–100.0)
Platelets: 111 10*3/uL — ABNORMAL LOW (ref 150–400)
RBC: 3.79 MIL/uL — ABNORMAL LOW (ref 3.87–5.11)
RDW: 14.4 % (ref 11.5–15.5)
WBC: 4.9 10*3/uL (ref 4.0–10.5)
nRBC: 0 % (ref 0.0–0.2)

## 2020-07-08 LAB — GLUCOSE, CAPILLARY
Glucose-Capillary: 144 mg/dL — ABNORMAL HIGH (ref 70–99)
Glucose-Capillary: 176 mg/dL — ABNORMAL HIGH (ref 70–99)
Glucose-Capillary: 151 mg/dL — ABNORMAL HIGH (ref 70–99)
Glucose-Capillary: 131 mg/dL — ABNORMAL HIGH (ref 70–99)

## 2020-07-08 MED ORDER — TRAMADOL HCL 50 MG PO TABS
50.0000 mg | ORAL_TABLET | Freq: Three times a day (TID) | ORAL | Status: DC | PRN
  Administered 2020-07-10 – 2020-08-04 (×14): 50 mg via ORAL
  Filled 2020-07-08 (×14): qty 1

## 2020-07-08 MED ORDER — CLONIDINE HCL 0.1 MG PO TABS
0.1000 mg | ORAL_TABLET | Freq: Two times a day (BID) | ORAL | Status: DC
  Administered 2020-07-08 – 2020-07-09 (×3): 0.1 mg via ORAL
  Filled 2020-07-08 (×3): qty 1

## 2020-07-08 MED ORDER — DILTIAZEM HCL 30 MG PO TABS
30.0000 mg | ORAL_TABLET | Freq: Four times a day (QID) | ORAL | Status: DC | PRN
  Administered 2020-07-09 – 2020-07-28 (×5): 30 mg via ORAL
  Filled 2020-07-08 (×5): qty 1

## 2020-07-08 MED ORDER — CHLORHEXIDINE GLUCONATE CLOTH 2 % EX PADS
6.0000 | MEDICATED_PAD | Freq: Every day | CUTANEOUS | Status: DC
  Administered 2020-07-08 – 2020-08-06 (×26): 6 via TOPICAL

## 2020-07-08 MED ORDER — CLONAZEPAM 0.25 MG PO TBDP
0.2500 mg | ORAL_TABLET | Freq: Two times a day (BID) | ORAL | Status: DC | PRN
  Administered 2020-07-08 – 2020-08-06 (×26): 0.25 mg via ORAL
  Filled 2020-07-08 (×27): qty 1

## 2020-07-08 MED ORDER — DILTIAZEM HCL ER COATED BEADS 120 MG PO CP24
120.0000 mg | ORAL_CAPSULE | Freq: Every day | ORAL | Status: DC
  Administered 2020-07-08 – 2020-07-09 (×2): 120 mg via ORAL
  Filled 2020-07-08 (×2): qty 1

## 2020-07-08 NOTE — Progress Notes (Signed)
Occupational Therapy Treatment Patient Details Name: Aniyia Rane MRN: [id number] DOB: 1956/04/18 Today's Date: 07/08/2020    History of present illness Latoia Eyster is a 65 y.o. female with medical history significant of chronic hypoxic and hypercapnic respiratory failure secondary COPD, on Trelegy PRN +2 L PRN, HTN, recent diagnosed DVT, morbid obesity, IDDM, Dementia, bipolar disorder, presented with altered mentation.   OT comments  RN assisted in removing pt from BiPAP for attempts at therapy session. Pt continues with poor cognition impacting ability to follow one step commands. Pt able to demo following of <25% of one step commands for simple ADLs and attempts to initiate bed mobility. Pt initially says "yes" to sitting EOB then once room set up, reports "no" and does not assist in further movement. Pt initially refused to allow OT to place nasal canula on (4 L O2) but amenable by end of session. O2 > 91% on RA while awake and at rest in bed. Continue to recommend SNF at discharge.    Follow Up Recommendations  SNF;Supervision/Assistance - 24 hour    Equipment Recommendations  None recommended by OT    Recommendations for Other Services      Precautions / Restrictions Precautions Precautions: Fall Precaution Comments: monitor O2, delirium Restrictions Weight Bearing Restrictions: No       Mobility Bed Mobility Overal bed mobility: Needs Assistance             General bed mobility comments: Pt initially agreeable to EOB attempts, but once bed rail moved out of way, pt declined to further move LEs to EOB.    Transfers                      Balance                                           ADL either performed or assessed with clinical judgement   ADL Overall ADL's : Needs assistance/impaired     Grooming: Moderate assistance;Bed level;Wash/dry face Grooming Details (indicate cue type and reason): Mod A to wash face, placed  washcloth in hand and pt able to bring to wipe mouth/nose. Did not follow cues to reach up to eyes/forehead                               General ADL Comments: Inconsistently following commands, varying answers due to impaired cognition. Unable to fully/safely assess EOB/OOB activities secondary to cognition     Vision   Vision Assessment?: No apparent visual deficits   Perception     Praxis      Cognition Arousal/Alertness: Awake/alert Behavior During Therapy: Flat affect;Impulsive Overall Cognitive Status: No family/caregiver present to determine baseline cognitive functioning Area of Impairment: Orientation;Attention;Following commands;Safety/judgement;Awareness;Problem solving;Memory                 Orientation Level: Disoriented to;Place;Time;Situation Current Attention Level: Focused Memory: Decreased short-term memory Following Commands: Follows one step commands inconsistently Safety/Judgement: Decreased awareness of safety;Decreased awareness of deficits Awareness: Intellectual Problem Solving: Slow processing;Difficulty sequencing;Requires verbal cues;Requires tactile cues General Comments: Inconsistent following of commands, poor safety awareness. Can get agitated quickly        Exercises     Shoulder Instructions       General Comments RN assisted in removing BiPAP for pt to participate. Pt would  not initially allow O2 to place nasal canula in nose but by end of session allowed it. Pt O2 on RA >91% at rest while awake. 96% when placed on 4 L nasal canula    Pertinent Vitals/ Pain       Pain Assessment: Faces Faces Pain Scale: No hurt Pain Intervention(s): Monitored during session  Home Living                                          Prior Functioning/Environment              Frequency  Min 2X/week        Progress Toward Goals  OT Goals(current goals can now be found in the care plan section)  Progress  towards OT goals: Progressing toward goals  Acute Rehab OT Goals Patient Stated Goal: unable to state OT Goal Formulation: Patient unable to participate in goal setting Time For Goal Achievement: 07/15/20 Potential to Achieve Goals: Fair ADL Goals Pt Will Perform Grooming: with min assist;sitting;bed level Pt Will Perform Upper Body Bathing: with mod assist;bed level Pt Will Perform Upper Body Dressing: with mod assist;bed level Additional ADL Goal #1: Pt to demonstrate ability to follow one step commands >50% of the time Additional ADL Goal #2: Pt to be A&Ox2 with use of external cues Additional ADL Goal #3: Pt to demo bed mobility at Min A in prep for ADL transfers  Plan Discharge plan remains appropriate    Co-evaluation                 AM-PAC OT "6 Clicks" Daily Activity     Outcome Measure   Help from another person eating meals?: A Lot Help from another person taking care of personal grooming?: A Lot Help from another person toileting, which includes using toliet, bedpan, or urinal?: Total Help from another person bathing (including washing, rinsing, drying)?: Total Help from another person to put on and taking off regular upper body clothing?: Total Help from another person to put on and taking off regular lower body clothing?: Total 6 Click Score: 8    End of Session Equipment Utilized During Treatment: Oxygen  OT Visit Diagnosis: Unsteadiness on feet (R26.81);Other abnormalities of gait and mobility (R26.89);Muscle weakness (generalized) (M62.81);Other symptoms and signs involving cognitive function;Other (comment) (decreased cardiopulmonary tolerance)   Activity Tolerance Other (comment) (limited by cognition)   Patient Left in bed;with call bell/phone within reach;with bed alarm set   Nurse Communication Mobility status        Time: 6712-4580 OT Time Calculation (min): 12 min  Charges: OT General Charges $OT Visit: 1 Visit OT Treatments $Therapeutic  Activity: 8-22 mins  Bradd Canary, OTR/L Acute Rehab Services Office: [phone]   Lorre Munroe 07/08/2020, 2:54 PM

## 2020-07-08 NOTE — Progress Notes (Signed)
PT Cancellation Note  Patient Details Name: Stephanie Rice MRN: [id number] DOB: Oct 01, 1955   Cancelled Treatment:    Reason Eval/Treat Not Completed: Other (comment).  Pt was sleepy but originally agreed to therapy.  When PT set up to do bed ex to start, pt became agitated and asked PT to stop, including pulling away aggressively.  Asked her what was wrong, and she became upset stating PT could make her fall.  Reassured her we were at bed level but pt stopped interacting with PT.  Try again at another time.   Ivar Drape 07/08/2020, 4:31 PM   Samul Dada, PT MS Acute Rehab Dept. Number: [facility] [id number] and [facility] [phone]

## 2020-07-08 NOTE — Progress Notes (Signed)
SLP Cancellation Note  Patient Details Name: Stephanie Rice MRN: [id number] DOB: 1955-12-10   Cancelled treatment:       Reason Eval/Treat Not Completed: Medical issues which prohibited therapy;Fatigue/lethargy limiting ability to participate (Pt is currently on biPAP and Michaella reported that she has been lethargic today. SLP will f/u on subsequent date.)  Essence Merle I. Vear Clock, MS, CCC-SLP Acute Rehabilitation Services Office number [phone] Pager [phone]  [name] 07/08/2020, 4:33 PM

## 2020-07-08 NOTE — Consult Note (Addendum)
   65 y.o.femalewith medical history significant ofchronic hypoxic and hypercapnic respiratory failure secondary COPD,on Trelegy PRN+2 L PRN, HTN,recent diagnosed DVT, morbid obesity,IDDM, Dementia,bipolar disorder, presented with altered mentation.  Patient was found to have hypercapnic respiratory failure.  Was hospitalized for further management.  Psych consult placed for history of bipolar and dementia with bizarre behavior the last 2 days.  Attempted to assess patient, however she appeared somnolent and not able to participate in evaluation.  Patient does have a history of bipolar disorder, she is currently being managed with Depakote.  Recent valproic acid levels appear to be therapeutic at 72.  Patient currently does not need additional medication management or adjustment.  Patient does not appear to be exhibiting any disruptive behavior, agitation, and or aggression.   -Continue current medication Depakote 500 mg p.o. 3 times daily for bipolar disorder, this medication was recently adjusted will obtain valproic acid level for Friday morning. -Patient has not received any additional as needed medication in over 48 hours(haldol 2mg  ).  -Patient also does not appear to be restrained, which does show some improvement in her overall behaviors and mentation. -It is worth noting patient is on prednisone, which could be contributing to some of her agitation and aggression.  However it appears prednisone is being used to treat her COPD exacerbation. -Psychiatry to sign off as patient, does not appear to be exhibiting any excessive agitation and or aggression at this time.

## 2020-07-08 NOTE — Progress Notes (Signed)
PROGRESS NOTE  Stephanie Rice PJA:[id number] DOB: [birth year]   PCP: [name], PA-C  Patient is from: SNF  DOA: 06/30/2020 LOS: 8  Chief complaints: Altered mental status  Brief Narrative / Interim history: 65 year old F with PMH of dementia, COPD/chronic RF on trilogy vent and 2 L oxygen as needed, recent DVT, morbid obesity, IDDM-2, bipolar disorder and HTN brought to ED from SNF with altered mental status and admitted for acute metabolic encephalopathy in the setting of hypercapnia from acute on chronic respiratory failure and COPD exacerbation, and urinary tract infection.  Treated with steroid, antibiotics and BiPAP with improvement in ABG but waxing and waning mental status and some concern about behavioral issue.  Psychiatry consulted.  Subjective: Seen and examined earlier this morning.  No major events overnight of this morning.  Patient is sleepy but wakes to voice.  However, she falls back to sleep.  She follows command but doesn't converse other than some mumbling.  Objective: Vitals:   07/08/20 0629 07/08/20 0815 07/08/20 0819 07/08/20 1544  BP:   (!) 173/75 140/71  Pulse: 81  76 65  Resp: 17   18  Temp:    (!) 97.3 F (36.3 C)  TempSrc:      SpO2: 99% 100%  97%  Weight:      Height:        Intake/Output Summary (Last 24 hours) at 07/08/2020 1626 Last data filed at 07/07/2020 1829 Gross per 24 hour  Intake --  Output 775 ml  Net -775 ml   Filed Weights   07/07/20 1731  Weight: 104.9 kg    Examination:  GENERAL: No apparent distress.  Nontoxic. HEENT: MMM.  Vision and hearing grossly intact.  NECK: Supple.  No apparent JVD.  RESP: 100% on 4 L.  No IWOB.  Fair aeration bilaterally but limited exam due to body habitus.. CVS:  RRR. Heart sounds normal.  ABD/GI/GU: BS+. Abd soft, NTND.  MSK/EXT:  Moves extremities. No apparent deformity. No edema.  SKIN: no apparent skin lesion or wound NEURO: Sleepy.  Wakes to voice briefly.  Follows commands.  No  apparent focal neuro deficit but limited exam due to mental status.Marland Kitchen PSYCH: Calm.  No distress or agitation.  Procedures:  None  Microbiology summarized: COVID-19 and influenza PCR nonreactive.  Assessment & Plan: Acute metabolic encephalopathy-multifactorial including hypercapnia, UTI, CAP, underlying dementia and possible delirium.  Reportedly waxing or waning.  Also concern about behavioral issues with underlying bipolar disorder.  CT head and TSH without acute finding.  She is sleepy this morning.  Briefly wakes to voice.  Follows some commands. -Treat treatable causes -Reorientation and delirium precautions -Minimize sedating medications-wean off clonidine slowly -Basic encephalopathy work-up including B12, ammonia and RPR.  -Appreciate help by psychiatry  Acute on chronic hypoxic and hypercapnic respiratory failure due to COPD exacerbation and CAP -Patient is on trilogy vent and oxygen 2 L as needed at baseline -ABG improved with BiPAP.  Minimal oxygen to keep saturation above 88%. -Completed 5 days of doxycycline and 3 days of ceftriaxone -On prednisone taper-currently 20 mg daily -Apparently, SNF does not take patients with trilogy vent.  So BiPAP on discharge  Atrial fibrillation with RVR: RVR resolved.  TTE without significant finding. -Change metoprolol to Cardizem given underlying COPD -Continue Eliquis-was on this for DVT -Discontinue as needed hydralazine  Dysphagia: -Dysphagia 2 diet per SLP. -Aspiration precaution.  UTI-completed antibiotic course with ceftriaxone and Keflex  Oropharyngeal dysphagia -SLP recommended dysphagia 2 diet  Mild hyperkalemia  Corrected with Lokelma.    Essential hypertension -Wean clonidine -P.o. Cardizem as above -Discontinue as needed hydralazine  Uncontrolled IDDM-2 with hyperglycemia and neuropathy: A1c 7.2%. Recent Labs  Lab 07/07/20 1935 07/07/20 2243 07/08/20 0728 07/08/20 1211 07/08/20 1540  GLUCAP 188* 207*  144* 176* 151*  -Continue current insulin regimen  History of bipolar disorder -Continue Depakote 500 mg 3 times daily per psychiatry.  History of DVT -Continue Eliquis.  Thrombocytopenia-platelet 111.  It does not look like she has gotten any heparin products here. -Continue monitoring  Morbid obesity Body mass index is 38.48 kg/m. Nutrition Problem: Increased nutrient needs Etiology: acute illness Signs/Symptoms: estimated needs Interventions: Refer to RD note for recommendations   DVT prophylaxis:   apixaban (ELIQUIS) tablet 5 mg  Code Status: DNR/DNI Family Communication: Patient and/or RN.  None at bedside. Level of care: Telemetry Medical Status is: Inpatient  Remains inpatient appropriate because:Altered mental status, Unsafe d/c plan and Inpatient level of care appropriate due to severity of illness   Dispo: The patient is from: SNF              Anticipated d/c is to: SNF              Anticipated d/c date is: 1 day              Patient currently is not medically stable to d/c.   Difficult to place patient No       Consultants:  Psychiatry   Sch Meds:  Scheduled Meds: . apixaban  5 mg Oral BID  . atorvastatin  20 mg Oral QHS  . cloNIDine  0.2 mg Oral BID  . divalproex  500 mg Oral Q8H  . fluticasone furoate-vilanterol  1 puff Inhalation Daily  . insulin aspart  0-20 Units Subcutaneous TID WC  . insulin aspart  0-5 Units Subcutaneous QHS  . insulin detemir  25 Units Subcutaneous Daily  . loxapine  20 mg Oral QHS  . melatonin  5 mg Oral QHS  . metoprolol tartrate  12.5 mg Oral BID  . pantoprazole  40 mg Oral Daily  . polyvinyl alcohol  1 drop Both Eyes Q breakfast  . predniSONE  20 mg Oral Q breakfast   Continuous Infusions: PRN Meds:.acetaminophen, albuterol, clonazepam, haloperidol lactate, hydrALAZINE, Resource ThickenUp Clear, traMADol  Antimicrobials: Anti-infectives (From admission, onward)   Start     Dose/Rate Route Frequency Ordered  Stop   07/03/20 1400  cephALEXin (KEFLEX) capsule 500 mg        500 mg Oral Every 8 hours 07/03/20 0954 07/07/20 1146   06/30/20 2200  doxycycline (VIBRA-TABS) tablet 100 mg        100 mg Oral Every 12 hours 06/30/20 1612 07/05/20 2159   06/30/20 2030  cefTRIAXone (ROCEPHIN) 2 g in sodium chloride 0.9 % 100 mL IVPB  Status:  Discontinued        2 g 200 mL/hr over 30 Minutes Intravenous Daily at bedtime 06/30/20 2006 07/03/20 0954   06/30/20 1800  Ampicillin-Sulbactam (UNASYN) 3 g in sodium chloride 0.9 % 100 mL IVPB  Status:  Discontinued        3 g 200 mL/hr over 30 Minutes Intravenous Every 6 hours 06/30/20 1703 06/30/20 1954       I have personally reviewed the following labs and images: CBC: Recent Labs  Lab 07/02/20 0220 07/03/20 0703 07/08/20 0302  WBC 5.1 4.5 4.9  HGB 12.6 12.0 11.4*  HCT 41.9 39.9 38.4  MCV 101.5*  102.3* 101.3*  PLT 208 208 111*   BMP &GFR Recent Labs  Lab 07/02/20 0220 07/03/20 0425 07/04/20 0136 07/05/20 0351 07/06/20 0235 07/08/20 0302  NA 141 143 140 139 139 141  K 5.4* 4.4 4.2 3.8 4.3 4.2  CL 97* 97* 94* 93* 93* 93*  CO2 33* 34* 39* 38* 35* 37*  GLUCOSE 215* 170* 296* 242* 238* 204*  BUN 22 25* 18 11 14 18   CREATININE 0.50 0.41* 0.46 0.40* 0.49 <0.30*  CALCIUM 8.5* 8.6* 8.4* 8.7* 8.3* 8.3*  MG 2.0  --   --   --   --   --    CrCl cannot be calculated (This lab value cannot be used to calculate CrCl because it is not a number: <0.30). Liver & Pancreas: Recent Labs  Lab 07/08/20 0302  AST 20  ALT 22  ALKPHOS 47  BILITOT 0.8  PROT 4.9*  ALBUMIN 2.4*   No results for input(s): LIPASE, AMYLASE in the last 168 hours. No results for input(s): AMMONIA in the last 168 hours. Diabetic: No results for input(s): HGBA1C in the last 72 hours. Recent Labs  Lab 07/07/20 1935 07/07/20 2243 07/08/20 0728 07/08/20 1211 07/08/20 1540  GLUCAP 188* 207* 144* 176* 151*   Cardiac Enzymes: No results for input(s): CKTOTAL, CKMB, CKMBINDEX,  TROPONINI in the last 168 hours. No results for input(s): PROBNP in the last 8760 hours. Coagulation Profile: No results for input(s): INR, PROTIME in the last 168 hours. Thyroid Function Tests: No results for input(s): TSH, T4TOTAL, FREET4, T3FREE, THYROIDAB in the last 72 hours. Lipid Profile: No results for input(s): CHOL, HDL, LDLCALC, TRIG, CHOLHDL, LDLDIRECT in the last 72 hours. Anemia Panel: No results for input(s): VITAMINB12, FOLATE, FERRITIN, TIBC, IRON, RETICCTPCT in the last 72 hours. Urine analysis:    Component Value Date/Time   COLORURINE YELLOW 06/30/2020 1211   APPEARANCEUR TURBID (A) 06/30/2020 1211   LABSPEC 1.023 06/30/2020 1211   PHURINE 5.0 06/30/2020 1211   GLUCOSEU NEGATIVE 06/30/2020 1211   HGBUR NEGATIVE 06/30/2020 1211   BILIRUBINUR NEGATIVE 06/30/2020 1211   KETONESUR 5 (A) 06/30/2020 1211   PROTEINUR 100 (A) 06/30/2020 1211   NITRITE POSITIVE (A) 06/30/2020 1211   LEUKOCYTESUR LARGE (A) 06/30/2020 1211   Sepsis Labs: Invalid input(s): PROCALCITONIN, LACTICIDVEN  Microbiology: Recent Results (from the past 240 hour(s))  Resp Panel by RT-PCR (Flu A&B, Covid) Nasopharyngeal Swab     Status: None   Collection Time: 06/30/20  3:57 PM   Specimen: Nasopharyngeal Swab; Nasopharyngeal(NP) swabs in vial transport medium  Result Value Ref Range Status   SARS Coronavirus 2 by RT PCR NEGATIVE NEGATIVE Final    Comment: (NOTE) SARS-CoV-2 target nucleic acids are NOT DETECTED.  The SARS-CoV-2 RNA is generally detectable in upper respiratory specimens during the acute phase of infection. The lowest concentration of SARS-CoV-2 viral copies this assay can detect is 138 copies/mL. A negative result does not preclude SARS-Cov-2 infection and should not be used as the sole basis for treatment or other patient management decisions. A negative result may occur with  improper specimen collection/handling, submission of specimen other than nasopharyngeal swab,  presence of viral mutation(s) within the areas targeted by this assay, and inadequate number of viral copies(<138 copies/mL). A negative result must be combined with clinical observations, patient history, and epidemiological information. The expected result is Negative.  Fact Sheet for Patients:  BloggerCourse.comhttps://www.fda.gov/media/152166/download  Fact Sheet for Healthcare Providers:  SeriousBroker.ithttps://www.fda.gov/media/152162/download  This test is no t yet approved or cleared  by the [place] and  has been authorized for detection and/or diagnosis of SARS-CoV-2 by FDA under an Emergency Use Authorization (EUA). This EUA will remain  in effect (meaning this test can be used) for the duration of the COVID-19 declaration under Section 564(b)(1) of the Act, 21 U.S.C.section 360bbb-3(b)(1), unless the authorization is terminated  or revoked sooner.       Influenza A by PCR NEGATIVE NEGATIVE Final   Influenza B by PCR NEGATIVE NEGATIVE Final    Comment: (NOTE) The Xpert Xpress SARS-CoV-2/FLU/RSV plus assay is intended as an aid in the diagnosis of influenza from Nasopharyngeal swab specimens and should not be used as a sole basis for treatment. Nasal washings and aspirates are unacceptable for Xpert Xpress SARS-CoV-2/FLU/RSV testing.  Fact Sheet for Patients: BloggerCourse.com  Fact Sheet for Healthcare Providers: SeriousBroker.it  This test is not yet approved or cleared by the [place] FDA and has been authorized for detection and/or diagnosis of SARS-CoV-2 by FDA under an Emergency Use Authorization (EUA). This EUA will remain in effect (meaning this test can be used) for the duration of the COVID-19 declaration under Section 564(b)(1) of the Act, 21 U.S.C. section 360bbb-3(b)(1), unless the authorization is terminated or revoked.  Performed at [facility], [address] [place], [place] [address]     Radiology  Studies: No results found.    Jaquari Reckner T. Keysi Oelkers [facility]  If 7PM-7AM, please contact night-coverage [email or web address] 07/08/2020, 4:26 PM

## 2020-07-09 DIAGNOSIS — E1165 Type 2 diabetes mellitus with hyperglycemia: Secondary | ICD-10-CM | POA: Diagnosis not present

## 2020-07-09 DIAGNOSIS — F319 Bipolar disorder, unspecified: Secondary | ICD-10-CM | POA: Diagnosis not present

## 2020-07-09 DIAGNOSIS — I10 Essential (primary) hypertension: Secondary | ICD-10-CM | POA: Diagnosis not present

## 2020-07-09 DIAGNOSIS — R338 Other retention of urine: Secondary | ICD-10-CM

## 2020-07-09 LAB — RENAL FUNCTION PANEL
Albumin: 2.3 g/dL — ABNORMAL LOW (ref 3.5–5.0)
Anion gap: 11 (ref 5–15)
BUN: 17 mg/dL (ref 8–23)
CO2: 36 mmol/L — ABNORMAL HIGH (ref 22–32)
Calcium: 8.3 mg/dL — ABNORMAL LOW (ref 8.9–10.3)
Chloride: 93 mmol/L — ABNORMAL LOW (ref 98–111)
Creatinine, Ser: 0.32 mg/dL — ABNORMAL LOW (ref 0.44–1.00)
GFR, Estimated: >60 mL/min (ref >60)
Glucose, Bld: 181 mg/dL — ABNORMAL HIGH (ref 70–99)
Phosphorus: 2.9 mg/dL (ref 2.5–4.6)
Potassium: 4 mmol/L (ref 3.5–5.1)
Sodium: 140 mmol/L (ref 135–145)

## 2020-07-09 LAB — CBC
HCT: 37.7 % (ref 36.0–46.0)
Hemoglobin: 11.4 g/dL — ABNORMAL LOW (ref 12.0–15.0)
MCH: 30.2 pg (ref 26.0–34.0)
MCHC: 30.2 g/dL (ref 30.0–36.0)
MCV: 99.7 fL (ref 80.0–100.0)
Platelets: 96 10*3/uL — ABNORMAL LOW (ref 150–400)
RBC: 3.78 MIL/uL — ABNORMAL LOW (ref 3.87–5.11)
RDW: 14.1 % (ref 11.5–15.5)
WBC: 4.2 10*3/uL (ref 4.0–10.5)
nRBC: 0 % (ref 0.0–0.2)

## 2020-07-09 LAB — AMMONIA: Ammonia: 34 umol/L (ref 9–35)

## 2020-07-09 LAB — SARS CORONAVIRUS 2 (TAT 6-24 HRS): SARS Coronavirus 2: NEGATIVE

## 2020-07-09 LAB — MAGNESIUM: Magnesium: 1.7 mg/dL (ref 1.7–2.4)

## 2020-07-09 LAB — GLUCOSE, CAPILLARY
Glucose-Capillary: 87 mg/dL (ref 70–99)
Glucose-Capillary: 148 mg/dL — ABNORMAL HIGH (ref 70–99)
Glucose-Capillary: 221 mg/dL — ABNORMAL HIGH (ref 70–99)
Glucose-Capillary: 107 mg/dL — ABNORMAL HIGH (ref 70–99)

## 2020-07-09 LAB — VITAMIN B12: Vitamin B-12: 591 pg/mL (ref 180–914)

## 2020-07-09 LAB — RPR: RPR Ser Ql: NONREACTIVE

## 2020-07-09 MED ORDER — MAGNESIUM SULFATE 2 GM/50ML IV SOLN
2.0000 g | Freq: Once | INTRAVENOUS | Status: AC
  Administered 2020-07-09: 2 g via INTRAVENOUS
  Filled 2020-07-09: qty 50

## 2020-07-09 MED ORDER — BETHANECHOL CHLORIDE 25 MG PO TABS
25.0000 mg | ORAL_TABLET | Freq: Once | ORAL | Status: AC
  Administered 2020-07-09: 25 mg via ORAL
  Filled 2020-07-09: qty 1

## 2020-07-09 MED ORDER — BETHANECHOL CHLORIDE 10 MG PO TABS
10.0000 mg | ORAL_TABLET | Freq: Three times a day (TID) | ORAL | Status: DC
  Administered 2020-07-09 – 2020-07-10 (×4): 10 mg via ORAL
  Filled 2020-07-09 (×4): qty 1

## 2020-07-09 NOTE — Progress Notes (Addendum)
PROGRESS NOTE  Stephanie Rice OMV:[id number] DOB: 12-17-1955   PCP: [name], PA-C  Patient is from: SNF  DOA: 06/30/2020 LOS: 9  Chief complaints: Altered mental status  Brief Narrative / Interim history: 65 year old F with PMH of dementia, COPD/chronic RF on trilogy vent and 2 L oxygen as needed, recent DVT, morbid obesity, IDDM-2, bipolar disorder and HTN brought to ED from SNF with altered mental status and admitted for acute metabolic encephalopathy in the setting of hypercapnia from acute on chronic respiratory failure and COPD exacerbation, and urinary tract infection.  Treated with steroid, antibiotics and BiPAP with improvement in ABG but waxing and waning mental status and some concern about behavioral issue.  Psychiatry consulted and gave recommendation.  Subjective: Seen and examined earlier this morning.  No major events overnight of this morning.  No complaints but not a great historian.  She is currently on BiPAP.  She responds no to pain.  She follows commands.   Objective: Vitals:   07/09/20 0103 07/09/20 0301 07/09/20 0931 07/09/20 1100  BP:    99/83  Pulse: (!) 58 61  80  Resp: 17   18  Temp:  97.7 F (36.5 C)    TempSrc:  Axillary    SpO2: 100%  100% 100%  Weight:      Height:        Intake/Output Summary (Last 24 hours) at 07/09/2020 1451 Last data filed at 07/09/2020 0302 Gross per 24 hour  Intake --  Output 725 ml  Net -725 ml   Filed Weights   07/07/20 1731  Weight: 104.9 kg    Examination:  GENERAL: No apparent distress.  Nontoxic. HEENT: MMM.  Vision and hearing grossly intact.  NECK: Supple.  No apparent JVD.  RESP: On BiPAP.  No IWOB.  Fair aeration bilaterally. CVS:  RRR. Heart sounds normal.  ABD/GI/GU: BS+. Abd soft, NTND.  MSK/EXT:  Moves extremities. No apparent deformity. No edema.  SKIN: no apparent skin lesion or wound NEURO: Sleepy but wakes to voice easily.  Follows commands.  No apparent focal neuro deficit. PSYCH:  Calm.  No distress or agitation.  Procedures:  None  Microbiology summarized: COVID-19 and influenza PCR nonreactive.  Assessment & Plan: Acute metabolic encephalopathy-multifactorial including hypercapnia, UTI, CAP, underlying dementia and possible delirium.  Reportedly waxing or waning.  Also concern about behavioral issues with underlying bipolar disorder.  CT head, RPR, B12 and TSH without acute finding.  Sleepy but awakes to voice easily.  Follows commands appropriately.  Per daughter, she always say no to any question.  -Treat treatable causes -Reorientation and delirium precautions -Minimize sedating medications-wean off clonidine slowly -Appreciate help by psychiatry-continue Depakote 500 mg 3 times a day  Acute on chronic hypoxic and hypercapnic respiratory failure due to COPD exacerbation and CAP -Patient is on trilogy vent and oxygen 2 L as needed at baseline -ABG improved with BiPAP.  Minimal oxygen to keep saturation above 88%. -Completed 5 days of doxycycline and 3 days of ceftriaxone -Discontinue prednisone. -Apparently, SNF does not take patients with trilogy vent.  So BiPAP on discharge  Atrial fibrillation with RVR: RVR resolved.  TTE without significant finding. -Changed metoprolol to Cardizem given underlying COPD -Continue Eliquis-was on this for DVT -Discontinue as needed hydralazine  Dysphagia: -Dysphagia 2 diet per SLP. -Aspiration precaution.  UTI-completed antibiotic course with ceftriaxone and Keflex  Oropharyngeal dysphagia -SLP recommended dysphagia 2 diet  Mild hyperkalemia Corrected with Lokelma.    Essential hypertension -Wean clonidine -P.o. Cardizem  as above -Discontinue as needed hydralazine  Uncontrolled IDDM-2 with hyperglycemia and neuropathy: A1c 7.2%. Recent Labs  Lab 07/08/20 1211 07/08/20 1540 07/08/20 1939 07/09/20 0816 07/09/20 1248  GLUCAP 176* 151* 131* 87 148*  -Continue current insulin regimen  History of  bipolar disorder -Continue Depakote 500 mg 3 times daily per psychiatry.  History of DVT -Continue Eliquis.  Acute urinary retention: Review of a flow chart shows about 700 cc urine output on I&O cath over the last few days.  She had 370 cc on bladder scan the evening of 2/23.  Unclear how much urine came out after Foley insertion. -Start bethanechol -We will try voiding trial tomorrow once bethanechol kicks in.  Thrombocytopenia-platelet 96.  Downtrending.  It does not look like she has gotten any heparin products here. -Continue monitoring  Morbid obesity Body mass index is 38.48 kg/m. Nutrition Problem: Increased nutrient needs Etiology: acute illness Signs/Symptoms: estimated needs Interventions: Refer to RD note for recommendations   DVT prophylaxis:   apixaban (ELIQUIS) tablet 5 mg  Code Status: DNR/DNI Family Communication: Updated patient's daughter over the phone. Level of care: Telemetry Medical Status is: Inpatient  Remains inpatient appropriate because:Unsafe d/c plan   Dispo: The patient is from: SNF              Anticipated d/c is to: SNF              Anticipated d/c date is: 1 day              Patient currently is not medically stable to d/c.   Difficult to place patient No       Consultants:  Psychiatry   Sch Meds:  Scheduled Meds: . apixaban  5 mg Oral BID  . atorvastatin  20 mg Oral QHS  . Chlorhexidine Gluconate Cloth  6 each Topical Daily  . cloNIDine  0.1 mg Oral BID  . diltiazem  120 mg Oral Daily  . divalproex  500 mg Oral Q8H  . fluticasone furoate-vilanterol  1 puff Inhalation Daily  . insulin aspart  0-20 Units Subcutaneous TID WC  . insulin aspart  0-5 Units Subcutaneous QHS  . insulin detemir  25 Units Subcutaneous Daily  . loxapine  20 mg Oral QHS  . melatonin  5 mg Oral QHS  . pantoprazole  40 mg Oral Daily  . polyvinyl alcohol  1 drop Both Eyes Q breakfast  . predniSONE  20 mg Oral Q breakfast   Continuous  Infusions: PRN Meds:.acetaminophen, albuterol, clonazepam, diltiazem, haloperidol lactate, Resource ThickenUp Clear, traMADol  Antimicrobials: Anti-infectives (From admission, onward)   Start     Dose/Rate Route Frequency Ordered Stop   07/03/20 1400  cephALEXin (KEFLEX) capsule 500 mg        500 mg Oral Every 8 hours 07/03/20 0954 07/07/20 1146   06/30/20 2200  doxycycline (VIBRA-TABS) tablet 100 mg        100 mg Oral Every 12 hours 06/30/20 1612 07/05/20 2159   06/30/20 2030  cefTRIAXone (ROCEPHIN) 2 g in sodium chloride 0.9 % 100 mL IVPB  Status:  Discontinued        2 g 200 mL/hr over 30 Minutes Intravenous Daily at bedtime 06/30/20 2006 07/03/20 0954   06/30/20 1800  Ampicillin-Sulbactam (UNASYN) 3 g in sodium chloride 0.9 % 100 mL IVPB  Status:  Discontinued        3 g 200 mL/hr over 30 Minutes Intravenous Every 6 hours 06/30/20 1703 06/30/20 1954  I have personally reviewed the following labs and images: CBC: Recent Labs  Lab 07/03/20 0703 07/08/20 0302 07/09/20 0104  WBC 4.5 4.9 4.2  HGB 12.0 11.4* 11.4*  HCT 39.9 38.4 37.7  MCV 102.3* 101.3* 99.7  PLT 208 111* 96*   BMP &GFR Recent Labs  Lab 07/04/20 0136 07/05/20 0351 07/06/20 0235 07/08/20 0302 07/09/20 0104  NA 140 139 139 141 140  K 4.2 3.8 4.3 4.2 4.0  CL 94* 93* 93* 93* 93*  CO2 39* 38* 35* 37* 36*  GLUCOSE 296* 242* 238* 204* 181*  BUN 18 11 14 18 17   CREATININE 0.46 0.40* 0.49 <0.30* 0.32*  CALCIUM 8.4* 8.7* 8.3* 8.3* 8.3*  MG  --   --   --   --  1.7  PHOS  --   --   --   --  2.9   Estimated Creatinine Clearance: 82.1 mL/min (A) (by C-G formula based on SCr of 0.32 mg/dL (L)). Liver & Pancreas: Recent Labs  Lab 07/08/20 0302 07/09/20 0104  AST 20  --   ALT 22  --   ALKPHOS 47  --   BILITOT 0.8  --   PROT 4.9*  --   ALBUMIN 2.4* 2.3*   No results for input(s): LIPASE, AMYLASE in the last 168 hours. Recent Labs  Lab 07/09/20 0104  AMMONIA 34   Diabetic: No results for  input(s): HGBA1C in the last 72 hours. Recent Labs  Lab 07/08/20 1211 07/08/20 1540 07/08/20 1939 07/09/20 0816 07/09/20 1248  GLUCAP 176* 151* 131* 87 148*   Cardiac Enzymes: No results for input(s): CKTOTAL, CKMB, CKMBINDEX, TROPONINI in the last 168 hours. No results for input(s): PROBNP in the last 8760 hours. Coagulation Profile: No results for input(s): INR, PROTIME in the last 168 hours. Thyroid Function Tests: No results for input(s): TSH, T4TOTAL, FREET4, T3FREE, THYROIDAB in the last 72 hours. Lipid Profile: No results for input(s): CHOL, HDL, LDLCALC, TRIG, CHOLHDL, LDLDIRECT in the last 72 hours. Anemia Panel: Recent Labs    07/09/20 0104  VITAMINB12 591   Urine analysis:    Component Value Date/Time   COLORURINE YELLOW 06/30/2020 1211   APPEARANCEUR TURBID (A) 06/30/2020 1211   LABSPEC 1.023 06/30/2020 1211   PHURINE 5.0 06/30/2020 1211   GLUCOSEU NEGATIVE 06/30/2020 1211   HGBUR NEGATIVE 06/30/2020 1211   BILIRUBINUR NEGATIVE 06/30/2020 1211   KETONESUR 5 (A) 06/30/2020 1211   PROTEINUR 100 (A) 06/30/2020 1211   NITRITE POSITIVE (A) 06/30/2020 1211   LEUKOCYTESUR LARGE (A) 06/30/2020 1211   Sepsis Labs: Invalid input(s): PROCALCITONIN, LACTICIDVEN  Microbiology: Recent Results (from the past 240 hour(s))  Resp Panel by RT-PCR (Flu A&B, Covid) Nasopharyngeal Swab     Status: None   Collection Time: 06/30/20  3:57 PM   Specimen: Nasopharyngeal Swab; Nasopharyngeal(NP) swabs in vial transport medium  Result Value Ref Range Status   SARS Coronavirus 2 by RT PCR NEGATIVE NEGATIVE Final    Comment: (NOTE) SARS-CoV-2 target nucleic acids are NOT DETECTED.  The SARS-CoV-2 RNA is generally detectable in upper respiratory specimens during the acute phase of infection. The lowest concentration of SARS-CoV-2 viral copies this assay can detect is 138 copies/mL. A negative result does not preclude SARS-Cov-2 infection and should not be used as the sole basis  for treatment or other patient management decisions. A negative result may occur with  improper specimen collection/handling, submission of specimen other than nasopharyngeal swab, presence of viral mutation(s) within the areas targeted by this  assay, and inadequate number of viral copies(<138 copies/mL). A negative result must be combined with clinical observations, patient history, and epidemiological information. The expected result is Negative.  Fact Sheet for Patients:  BloggerCourse.comhttps://www.fda.gov/media/152166/download  Fact Sheet for Healthcare Providers:  SeriousBroker.ithttps://www.fda.gov/media/152162/download  This test is no t yet approved or cleared by the [place] FDA and  has been authorized for detection and/or diagnosis of SARS-CoV-2 by FDA under an Emergency Use Authorization (EUA). This EUA will remain  in effect (meaning this test can be used) for the duration of the COVID-19 declaration under Section 564(b)(1) of the Act, 21 U.S.C.section 360bbb-3(b)(1), unless the authorization is terminated  or revoked sooner.       Influenza A by PCR NEGATIVE NEGATIVE Final   Influenza B by PCR NEGATIVE NEGATIVE Final    Comment: (NOTE) The Xpert Xpress SARS-CoV-2/FLU/RSV plus assay is intended as an aid in the diagnosis of influenza from Nasopharyngeal swab specimens and should not be used as a sole basis for treatment. Nasal washings and aspirates are unacceptable for Xpert Xpress SARS-CoV-2/FLU/RSV testing.  Fact Sheet for Patients: BloggerCourse.comhttps://www.fda.gov/media/152166/download  Fact Sheet for Healthcare Providers: SeriousBroker.ithttps://www.fda.gov/media/152162/download  This test is not yet approved or cleared by the [place] FDA and has been authorized for detection and/or diagnosis of SARS-CoV-2 by FDA under an Emergency Use Authorization (EUA). This EUA will remain in effect (meaning this test can be used) for the duration of the COVID-19 declaration under Section 564(b)(1) of the Act, 21  U.S.C. section 360bbb-3(b)(1), unless the authorization is terminated or revoked.  Performed at [facility], [address] [place], [place] [id number]     Radiology Studies: No results found.    Mohmed Farver T. Karriem Muench [facility]  If 7PM-7AM, please contact night-coverage [email or web address] 07/09/2020, 2:51 PM

## 2020-07-09 NOTE — Progress Notes (Signed)
Physical Therapy Treatment Patient Details Name: Stephanie Rice MRN: [id number] DOB: Jun 15, 1955 Today's Date: 07/09/2020    History of Present Illness Victoriah Wilds is a 65 y.o. female with medical history significant of chronic hypoxic and hypercapnic respiratory failure secondary COPD, on Trelegy PRN +2 L PRN, HTN, recent diagnosed DVT, morbid obesity, IDDM, Dementia, bipolar disorder, presented with altered mentation.    PT Comments    Pt received in bed on 3L O2. Agreeable to bed exercises. After completion of exercises, pt agreeable to sitting EOB. She required max assist supine to sit, mod assist to maintain sitting balance. Pt sat approx 30 seconds before impulsively attempting to return to bed, stating "I can't sit anymore." Pt required mod assist for sit to supine. Pt assisted with repositioning in bed. Pt asleep before therapist exited room.    Follow Up Recommendations  SNF;Supervision/Assistance - 24 hour     Equipment Recommendations  Other (comment) (defer to next venue)    Recommendations for Other Services       Precautions / Restrictions Precautions Precautions: Fall;Other (comment) Precaution Comments: monitor O2, delirium    Mobility  Bed Mobility Overal bed mobility: Needs Assistance Bed Mobility: Supine to Sit;Sit to Supine     Supine to sit: Max assist;HOB elevated Sit to supine: Mod assist   General bed mobility comments: assist with BLE and to elevate trunk, use of bed pad to scoot to EOB; Pt sat EOB x 30 seconds before impulsively beginning transition back to bed. Assist with BLE back into bed and to reposition trunk.    Transfers                 General transfer comment: unable to progress beyond EOB  Ambulation/Gait                 Stairs             Wheelchair Mobility    Modified Rankin (Stroke Patients Only)       Balance Overall balance assessment: Needs assistance Sitting-balance support: No upper  extremity supported;Feet supported Sitting balance-Leahy Scale: Poor Sitting balance - Comments: spontaneous lossess of balance posteriorly with assist to recover                                    Cognition Arousal/Alertness: Awake/alert Behavior During Therapy: Flat affect;Impulsive Overall Cognitive Status: No family/caregiver present to determine baseline cognitive functioning Area of Impairment: Orientation;Attention;Following commands;Safety/judgement;Awareness;Problem solving;Memory                 Orientation Level: Disoriented to;Place;Time;Situation Current Attention Level: Focused Memory: Decreased short-term memory Following Commands: Follows one step commands inconsistently Safety/Judgement: Decreased awareness of safety;Decreased awareness of deficits Awareness: Intellectual Problem Solving: Slow processing;Difficulty sequencing;Requires verbal cues;Requires tactile cues General Comments: Inconsistent following of commands, poor safety awareness. Can get agitated quickly      Exercises General Exercises - Lower Extremity Ankle Circles/Pumps: AAROM;Right;Left;10 reps;Supine Heel Slides: AAROM;Right;Left;10 reps;Supine Hip ABduction/ADduction: AAROM;Right;Left;10 reps;Supine    General Comments General comments (skin integrity, edema, etc.): VSS on 3L      Pertinent Vitals/Pain Pain Assessment: Faces Faces Pain Scale: Hurts even more Pain Location: BLE with mobility Pain Descriptors / Indicators: Grimacing;Guarding;Discomfort Pain Intervention(s): Monitored during session;Limited activity within patient's tolerance;Repositioned    Home Living                      Prior Function  PT Goals (current goals can now be found in the care plan section) Acute Rehab PT Goals Patient Stated Goal: unable to state Progress towards PT goals: Progressing toward goals    Frequency    Min 2X/week      PT Plan Current plan  remains appropriate    Co-evaluation              AM-PAC PT "6 Clicks" Mobility   Outcome Measure  Help needed turning from your back to your side while in a flat bed without using bedrails?: A Lot Help needed moving from lying on your back to sitting on the side of a flat bed without using bedrails?: A Lot Help needed moving to and from a bed to a chair (including a wheelchair)?: Total Help needed standing up from a chair using your arms (e.g., wheelchair or bedside chair)?: Total Help needed to walk in hospital room?: Total Help needed climbing 3-5 steps with a railing? : Total 6 Click Score: 8    End of Session Equipment Utilized During Treatment: Oxygen Activity Tolerance: Other (comment) (limited by cognition) Patient left: in bed;with call bell/phone within reach;with bed alarm set Nurse Communication: Mobility status PT Visit Diagnosis: Other abnormalities of gait and mobility (R26.89)     Time: 1749-4496 PT Time Calculation (min) (ACUTE ONLY): 16 min  Charges:  $Therapeutic Exercise: 8-22 mins                     Aida Raider, PT  Office # [phone] Pager [phone]    Ilda Foil 07/09/2020, 11:09 AM

## 2020-07-09 NOTE — Plan of Care (Signed)
  Problem: Coping: Goal: Level of anxiety will decrease Outcome: Progressing   Problem: Elimination: Goal: Will not experience complications related to urinary retention Outcome: Progressing   Problem: Safety: Goal: Ability to remain free from injury will improve Outcome: Progressing   Problem: Skin Integrity: Goal: Risk for impaired skin integrity will decrease Outcome: Progressing   

## 2020-07-09 NOTE — Plan of Care (Signed)
  Problem: Nutrition: Goal: Adequate nutrition will be maintained Outcome: Progressing   Problem: Elimination: Goal: Will not experience complications related to bowel motility Outcome: Progressing   Problem: Elimination: Goal: Will not experience complications related to urinary retention Outcome: Progressing   Problem: Safety: Goal: Ability to remain free from injury will improve Outcome: Progressing   Problem: Skin Integrity: Goal: Risk for impaired skin integrity will decrease Outcome: Progressing

## 2020-07-09 NOTE — Care Management Important Message (Signed)
Important Message  Patient Details  Name: Stephanie Rice MRN: [id number] Date of Birth: 1955/09/28   Medicare Important Message Given:  Yes     Dorena Bodo 07/09/2020, 2:48 PM

## 2020-07-09 NOTE — Progress Notes (Signed)
  Speech Language Pathology Treatment: Dysphagia  Patient Details Name: Stephanie Rice MRN: [id number] DOB: 1956/02/09 Today's Date: 07/09/2020 Time: 7106-2694 SLP Time Calculation (min) (ACUTE ONLY): 15 min  Assessment / Plan / Recommendation Clinical Impression  Pt was seen for dysphagia treatment during breakfast. Pt's nurse, Sammantha, reported that the pt has been tolerating the current diet without difficulty. Pt's liquids are currently at her baseline and her solids are slightly more advanced. Pt consumed a meal of scrambled eggs, ground sausage, a magic cup, pureed pineapple, and honey thick liquids. No s/sx of aspiration were noted with solids or liquids. Mild oral residue was cleared with a liquid wash. She independently consumed individual swallows of honey thick liquids, but intermittently requested additional boluses of solids prior to swallowing the initial bolus. Pt's swallow appears to be at baseline at this time. Pt's daughter reported prior that the pt was receiving dysphagia treatment at the SNF, but she has exhibited difficulty following the necessary commands for dysphagia exercises during this admission. Acute skilled SLP services will be discontinued at this time, but continued SLP services are recommended at the SNF for dysphagia treatment.   HPI HPI: Pt is a 65 y.o. female with medical history significant of chronic hypoxic and hypercapnic respiratory failure secondary COPD, on Trelegy PRN +2 L PRN, HTN, recent lydiagnosed DVT, morbid obesity, IDDM, Dementia, bipolar disorder, who presented with altered mentation. CT head was negative. CXR 2/15: Right middle lobe consolidation most compatible with pneumonia. MD considered this to be residue imaging changes from a PNA pt had last month, vs a new infection and coverage was expanded for aspiration. Per EMR, pt's daughter has reported that pt has chronic dysphagia on pureed diet, with strict instruction of sitting up 30 min before  feed.      SLP Plan  All goals met;Discharge SLP treatment due to (comment)       Recommendations  Diet recommendations: Dysphagia 2 (fine chop);Honey-thick liquid Liquids provided via: Cup;Straw Medication Administration: Crushed with puree Supervision: Full supervision/cueing for compensatory strategies;Staff to assist with self feeding Compensations: Small sips/bites;Slow rate;Multiple dry swallows after each bite/sip Postural Changes and/or Swallow Maneuvers: Seated upright 90 degrees                Oral Care Recommendations: Oral care BID Follow up Recommendations: Skilled Nursing facility SLP Visit Diagnosis: Dysphagia, oropharyngeal phase (R13.12) Plan: All goals met;Discharge SLP treatment due to (comment)       Felipe Cabell I. Hardin Negus, [place], [place] Office number [phone] Pager [place] 07/09/2020, 9:56 AM

## 2020-07-09 NOTE — Progress Notes (Signed)
PT Cancellation Note  Patient Details Name: Stephanie Rice MRN: [id number] DOB: [birth year]   Cancelled Treatment:    Reason Eval/Treat Not Completed: Medical issues which prohibited therapy.  Pt still sleeping and on bipap. RN requesting PT re-attempt at later time.   Ilda Foil 07/09/2020, 8:29 AM   Aida Raider, PT  Office # [phone] Pager [phone]

## 2020-07-09 NOTE — TOC Progression Note (Signed)
Transition of Care [facility]) - Progression Note    Patient Details  Name: Stephanie Rice MRN: [id number] Date of Birth: [birth year]  Transition of Care [facility]) CM/SW Contact  [name], LCSW Phone Number: 07/09/2020, 3:00 PM  Clinical Narrative:   CSW spoke with daughter Joyce Gross.  One bed offer: [facility].  Daughter chooses to send pt back to [place].  Auth request submitted to [facility].    Expected Discharge Plan: Skilled Nursing Facility Barriers to Discharge: Continued Medical Work up  Expected Discharge Plan and Services Expected Discharge Plan: Skilled Nursing Facility     Post Acute Care Choice:  (daughter requesting LTAC) Living arrangements for the past 2 months: Skilled Nursing Facility (was hospitalized at [facility] for 30+ days prior to SNF)                                       Social Determinants of Health (SDOH) Interventions    Readmission Risk Interventions No flowsheet data found.

## 2020-07-09 NOTE — Progress Notes (Signed)
Pt. Placed on bipap by nursing staff.

## 2020-07-10 ENCOUNTER — Encounter (HOSPITAL_COMMUNITY): Payer: Self-pay | Admitting: Internal Medicine

## 2020-07-10 DIAGNOSIS — F319 Bipolar disorder, unspecified: Secondary | ICD-10-CM | POA: Diagnosis not present

## 2020-07-10 DIAGNOSIS — F0390 Unspecified dementia without behavioral disturbance: Secondary | ICD-10-CM

## 2020-07-10 DIAGNOSIS — R5383 Other fatigue: Secondary | ICD-10-CM | POA: Diagnosis not present

## 2020-07-10 DIAGNOSIS — E1165 Type 2 diabetes mellitus with hyperglycemia: Secondary | ICD-10-CM | POA: Diagnosis not present

## 2020-07-10 DIAGNOSIS — I10 Essential (primary) hypertension: Secondary | ICD-10-CM | POA: Diagnosis not present

## 2020-07-10 DIAGNOSIS — J449 Chronic obstructive pulmonary disease, unspecified: Secondary | ICD-10-CM

## 2020-07-10 DIAGNOSIS — D696 Thrombocytopenia, unspecified: Secondary | ICD-10-CM

## 2020-07-10 DIAGNOSIS — Z79899 Other long term (current) drug therapy: Secondary | ICD-10-CM

## 2020-07-10 DIAGNOSIS — Z86718 Personal history of other venous thrombosis and embolism: Secondary | ICD-10-CM

## 2020-07-10 DIAGNOSIS — Z7901 Long term (current) use of anticoagulants: Secondary | ICD-10-CM

## 2020-07-10 DIAGNOSIS — E119 Type 2 diabetes mellitus without complications: Secondary | ICD-10-CM

## 2020-07-10 DIAGNOSIS — Z794 Long term (current) use of insulin: Secondary | ICD-10-CM

## 2020-07-10 LAB — CBC
HCT: 37.2 % (ref 36.0–46.0)
Hemoglobin: 11.2 g/dL — ABNORMAL LOW (ref 12.0–15.0)
MCH: 29.8 pg (ref 26.0–34.0)
MCHC: 30.1 g/dL (ref 30.0–36.0)
MCV: 98.9 fL (ref 80.0–100.0)
Platelets: 87 10*3/uL — ABNORMAL LOW (ref 150–400)
RBC: 3.76 MIL/uL — ABNORMAL LOW (ref 3.87–5.11)
RDW: 14.5 % (ref 11.5–15.5)
WBC: 5.5 10*3/uL (ref 4.0–10.5)
nRBC: 0 % (ref 0.0–0.2)

## 2020-07-10 LAB — RENAL FUNCTION PANEL
Albumin: 2.5 g/dL — ABNORMAL LOW (ref 3.5–5.0)
Anion gap: 11 (ref 5–15)
BUN: 16 mg/dL (ref 8–23)
CO2: 38 mmol/L — ABNORMAL HIGH (ref 22–32)
Calcium: 8.5 mg/dL — ABNORMAL LOW (ref 8.9–10.3)
Chloride: 92 mmol/L — ABNORMAL LOW (ref 98–111)
Creatinine, Ser: 0.4 mg/dL — ABNORMAL LOW (ref 0.44–1.00)
GFR, Estimated: >60 mL/min (ref >60)
Glucose, Bld: 229 mg/dL — ABNORMAL HIGH (ref 70–99)
Phosphorus: 3.9 mg/dL (ref 2.5–4.6)
Potassium: 3.7 mmol/L (ref 3.5–5.1)
Sodium: 141 mmol/L (ref 135–145)

## 2020-07-10 LAB — MAGNESIUM: Magnesium: 1.8 mg/dL (ref 1.7–2.4)

## 2020-07-10 LAB — GLUCOSE, CAPILLARY
Glucose-Capillary: 164 mg/dL — ABNORMAL HIGH (ref 70–99)
Glucose-Capillary: 140 mg/dL — ABNORMAL HIGH (ref 70–99)
Glucose-Capillary: 114 mg/dL — ABNORMAL HIGH (ref 70–99)

## 2020-07-10 LAB — SAVE SMEAR(SSMR), FOR PROVIDER SLIDE REVIEW

## 2020-07-10 LAB — VALPROIC ACID LEVEL: Valproic Acid Lvl: 93 ug/mL (ref 50.0–100.0)

## 2020-07-10 MED ORDER — DIVALPROEX SODIUM 125 MG PO CSDR
500.0000 mg | DELAYED_RELEASE_CAPSULE | Freq: Two times a day (BID) | ORAL | Status: DC
  Administered 2020-07-10 – 2020-08-06 (×53): 500 mg via ORAL
  Filled 2020-07-10 (×55): qty 4

## 2020-07-10 MED ORDER — POLYETHYLENE GLYCOL 3350 17 G PO PACK
17.0000 g | PACK | Freq: Two times a day (BID) | ORAL | Status: DC | PRN
  Administered 2020-07-10: 17 g via ORAL
  Filled 2020-07-10: qty 1

## 2020-07-10 MED ORDER — CLONIDINE HCL 0.2 MG PO TABS
0.2000 mg | ORAL_TABLET | Freq: Two times a day (BID) | ORAL | Status: DC
  Administered 2020-07-10 – 2020-07-19 (×20): 0.2 mg via ORAL
  Filled 2020-07-10 (×21): qty 1

## 2020-07-10 MED ORDER — BETHANECHOL CHLORIDE 25 MG PO TABS
25.0000 mg | ORAL_TABLET | Freq: Three times a day (TID) | ORAL | Status: DC
  Administered 2020-07-11 – 2020-08-06 (×75): 25 mg via ORAL
  Filled 2020-07-10 (×84): qty 1

## 2020-07-10 MED ORDER — DILTIAZEM HCL ER COATED BEADS 180 MG PO CP24
180.0000 mg | ORAL_CAPSULE | Freq: Every day | ORAL | Status: DC
  Administered 2020-07-10 – 2020-07-23 (×14): 180 mg via ORAL
  Filled 2020-07-10 (×15): qty 1

## 2020-07-10 MED ORDER — SENNOSIDES-DOCUSATE SODIUM 8.6-50 MG PO TABS
1.0000 | ORAL_TABLET | Freq: Two times a day (BID) | ORAL | Status: DC | PRN
  Administered 2020-07-10 – 2020-07-23 (×3): 1 via ORAL
  Filled 2020-07-10 (×3): qty 1

## 2020-07-10 NOTE — Consult Note (Signed)
  65 y.o.femalewith medical history significant ofchronic hypoxic and hypercapnic respiratory failure secondary COPD,on Trelegy PRN+2 L PRN, HTN,recent diagnosed DVT, morbid obesity,IDDM, Dementia,bipolar disorder, presented with altered mentation. Patient was found to have hypercapnic respiratory failure. Was hospitalized for further management. Chart review from previous admission at [facility] shows diagnosis of intellectual disability disorder, moderate(mental age of a 11-71-year-old), Alzheimer's dementia without behavioral disturbances, bipolar 1 disorder.  She was previously admitted to [facility] 2 months ago with similar presentation in which she displayed waxing and waning mental status.  Previous CT scan obtained in February 2022 and October 2020 show chronic small vessel ischemic disease and generalized brain atrophy.  Psychiatry consult was placed for recommendations as it relates to her psychiatric medication likely contributing to her thrombocytopenia.  Patient initially presented from SNF on Depakene 250 mg p.o. every 6 hour.  Her Depakote level was adjusted during his hospitalization, and she is currently taking 500 mg p.o. every 8 hours to target aggressive behavior.  On admission her Depakote level was within therapeutic range at 72, today her Depakote level is 93 still WNL goal.   It is felt that patient may need higher level of care due to aggression, depression, IDD, and dementia. Patient behaviors are consistent with a person with dementia and other neurocognitive disorders. Memory care unit would be appropriate as they are well equipped to help elderly persons with dementia, maintain cognitive skills and help with quality of life. Patient is unable to take care of self at baseline, as she has been declining due to her dementia. She was previously able to ambulate independently, however daughter noticed an increase in falls.  She needs more social interaction and person  centered care to help with worsening aggression and difficult dementia behaviors. Medication adjustments were made and she will need depakote levels checked on 07/14/2020, orders have been placed to reflect these adjustments.    New onset drug-induced thrombocytopenia  -Will decrease Depakote 500 mg p.o. twice daily. This was her original dose prior to admission.  -Depakote level placed for 07/14/2020.   Dementia/IDD - Increased potential for cognitive/functional decline with prolonged hospitalizations.  -Patient remains at risk for delirium, continue delirium precautions. -Please expect fluctuations in mood and behaviors, due to extensive neurocognitive disorders. -Consider placing patient in memory care unit.   -Psychiatry to sign off.

## 2020-07-10 NOTE — TOC Progression Note (Addendum)
Transition of Care [facility]) - Progression Note    Patient Details  Name: Stephanie Rice MRN: [id number] Date of Birth: [birth year]  Transition of Care [facility]) CM/SW Contact  [name], LCSW Phone Number: 07/10/2020, 2:19 PM  Clinical Narrative:   CSW received call from [employer] asking if we are asking for SNF auth or if pt is long term care.  CSW told them SNF auth.  [employer] states it appears that pt is at her baseline and they are questioning need for SNF, sending this to [facility] [facility].  CSW spoke with daughter Joyce Gross, who reports pt is not at her baseline.  Prior to [facility] stay, pt was ambulatory.  Pt had been at [facility] for 16 days as a rehab pt prior to this admission.    1615: called [employer] for update, still under review    Expected Discharge Plan: Skilled Nursing Facility Barriers to Discharge: Continued Medical Work up  Expected Discharge Plan and Services Expected Discharge Plan: Skilled Nursing Facility     Post Acute Care Choice:  (daughter requesting LTAC) Living arrangements for the past 2 months: Skilled Nursing Facility (was hospitalized at [place] for 30+ days prior to SNF)                                       Social Determinants of Health (SDOH) Interventions    Readmission Risk Interventions No flowsheet data found.

## 2020-07-10 NOTE — Progress Notes (Addendum)
PROGRESS NOTE  Bridget Bulverde LYY:[id number] DOB: Jul 17, 1955   PCP: [name], PA-C  Patient is from: SNF  DOA: 06/30/2020 LOS: 10  Chief complaints: Altered mental status  Brief Narrative / Interim history: 65 year old F with PMH of dementia, intellectual disability, COPD/chronic RF on trilogy vent and 2 L oxygen as needed, recent DVT, morbid obesity, IDDM-2, bipolar disorder and HTN brought to ED from SNF with altered mental status and admitted for acute metabolic encephalopathy in the setting of hypercapnia from acute on chronic respiratory failure and COPD exacerbation, and urinary tract infection.  Treated with steroid, antibiotics and BiPAP with improvement in ABG but waxing and waning mental status and some concern about behavioral issue.  Psychiatry consulted and gave recommendation.  Hospital course also complicated by acute thrombocytopenia and acute urinary retention.  Hematology consulted for thrombocytopenia.   Subjective: Seen and examined earlier this morning.  No major events overnight of this morning.  No complaints but not a reliable historian.  She is awake and alert.  She is oriented to self and "hospital".  She follows some commands.  Responds no to pain.  Objective: Vitals:   07/09/20 2144 07/10/20 0500 07/10/20 0726 07/10/20 0843  BP: (!) 185/66 (!) 184/115 (!) 177/76   Pulse: 77 78    Resp: 14 17 (!) 21   Temp: 99.1 F (37.3 C) 99.3 F (37.4 C) 97.8 F (36.6 C)   TempSrc: Oral Axillary Axillary   SpO2: 100% 100% 100% 100%  Weight:      Height:        Intake/Output Summary (Last 24 hours) at 07/10/2020 1416 Last data filed at 07/10/2020 0729 Gross per 24 hour  Intake --  Output 1600 ml  Net -1600 ml   Filed Weights   07/07/20 1731  Weight: 104.9 kg    Examination:   GENERAL: No apparent distress.  Nontoxic. HEENT: MMM.  Vision and hearing grossly intact.  NECK: Supple.  No apparent JVD.  RESP: 96% on 1 L.  No IWOB.  Fair aeration  bilaterally but limited exam due to body habitus.. CVS:  RRR. Heart sounds normal.  ABD/GI/GU: BS+. Abd soft, NTND.  MSK/EXT:  Moves extremities. No apparent deformity. No edema.  SKIN: Small bruise in old IV sites on right arm. NEURO: Awake and alert.  Oriented to self and "hospital".  Follows some commands.  No apparent focal neuro deficit. PSYCH: Calm. Normal affect.   Procedures:  None  Microbiology summarized: COVID-19 and influenza PCR nonreactive.  Assessment & Plan: Acute metabolic encephalopathy/delirium-multifactorial including hypercapnia, UTI, CAP, underlying dementia, bipolar disorder and intellectual disability.  Reportedly waxing or waning suggesting delirium.  She was also on steroids for COPD exacerbation which could have contributed.   CT head, RPR, B12 and TSH without acute finding. Per daughter, she always say no to any question.  She is awake and alert.  Oriented to self and "hospital".  Follows some commands. -Treat treatable causes -Reorientation and delirium precautions -Minimize sedating medications-wean off clonidine slowly -Psychiatry decrease Depakote to 500 mg twice daily in the setting of acute thrombocytopenia  Acute on chronic hypoxic and hypercapnic respiratory failure due to COPD exacerbation and CAP -Patient is on trilogy vent and oxygen 2 L as needed at baseline -ABG improved with BiPAP.  Minimal oxygen to keep saturation above 88%.  Discussed with RN. -Completed 5 days of doxycycline and 3 days of ceftriaxone -Completed course of systemic steroid -Apparently, SNF does not take patients with trilogy vent.  So  BiPAP on discharge  Atrial fibrillation with RVR: RVR resolved.  TTE without significant finding. -Changed metoprolol to Cardizem given underlying COPD -Continue Eliquis-was on this for DVT -Discontinue as needed hydralazine  Dysphagia: -Dysphagia 2 diet per SLP. -Aspiration precaution.  UTI-completed antibiotic course with ceftriaxone  and Keflex  Oropharyngeal dysphagia -SLP recommended dysphagia 2 diet  Mild hyperkalemia Corrected with Lokelma.    Uncontrolled hypertension: Improved. -Increase clonidine to 0.2 mg twice daily -Increased p.o. Cardizem CD to 180 mg daily -Discontinue as needed hydralazine  Uncontrolled IDDM-2 with hyperglycemia and neuropathy: A1c 7.2%. Recent Labs  Lab 07/09/20 1248 07/09/20 1714 07/09/20 2141 07/10/20 0732 07/10/20 1139  GLUCAP 148* 221* 107* 164* 140*  -Continue current insulin regimen  History of bipolar disorder -Psychiatry decreased Depakote to 500 mg twice daily (home dose) in the setting of thrombocytopenia. -Patient is also on loxapine 20 mg daily.  Since she is on 10 mg daily SNF.  Psychiatry to review.  History of DVT -Continue Eliquis.  Acute urinary retention: Review of a flow chart shows about 700 cc urine output on I&O cath over the last few days. Indwelling Foley catheter inserted on 2/24.  Addendum -Start bethanechol on 2/25 -Voiding trial today  Thrombocytopenia-down trending. Was 208 on 2/18 and before.  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  She is on Depakote and loxapine which could potentially cause thrombocytopenia.  Recent Labs  Lab 07/08/20 0302 07/09/20 0104 07/10/20 0531  PLT 111* 96* 87*  -Consulted hematology and psychiatry   Morbid obesity Body mass index is 38.48 kg/m. Nutrition Problem: Increased nutrient needs Etiology: acute illness Signs/Symptoms: estimated needs Interventions: Refer to RD note for recommendations   DVT prophylaxis:   apixaban (ELIQUIS) tablet 5 mg  Code Status: DNR/DNI Family Communication: Updated patient's daughter over the phone on 2/24. Level of care: Telemetry Medical Status is: Inpatient  Remains inpatient appropriate because:Ongoing diagnostic testing needed not appropriate for outpatient work up and Unsafe d/c plan   Dispo: The patient is from: SNF               Anticipated d/c is to: SNF              Anticipated d/c date is: 1 day              Patient currently is not medically stable to d/c.   Difficult to place patient No       Consultants:  Psychiatry Hematology   Sch Meds:  Scheduled Meds: . apixaban  5 mg Oral BID  . atorvastatin  20 mg Oral QHS  . bethanechol  10 mg Oral TID  . Chlorhexidine Gluconate Cloth  6 each Topical Daily  . cloNIDine  0.2 mg Oral BID  . diltiazem  180 mg Oral Daily  . divalproex  500 mg Oral Q12H  . fluticasone furoate-vilanterol  1 puff Inhalation Daily  . insulin aspart  0-20 Units Subcutaneous TID WC  . insulin aspart  0-5 Units Subcutaneous QHS  . insulin detemir  25 Units Subcutaneous Daily  . loxapine  20 mg Oral QHS  . melatonin  5 mg Oral QHS  . pantoprazole  40 mg Oral Daily  . polyvinyl alcohol  1 drop Both Eyes Q breakfast   Continuous Infusions: PRN Meds:.acetaminophen, albuterol, clonazepam, diltiazem, Resource ThickenUp Clear, traMADol  Antimicrobials: Anti-infectives (From admission, onward)   Start     Dose/Rate Route Frequency Ordered Stop   07/03/20 1400  cephALEXin (KEFLEX)  capsule 500 mg        500 mg Oral Every 8 hours 07/03/20 0954 07/07/20 1146   06/30/20 2200  doxycycline (VIBRA-TABS) tablet 100 mg        100 mg Oral Every 12 hours 06/30/20 1612 07/05/20 2159   06/30/20 2030  cefTRIAXone (ROCEPHIN) 2 g in sodium chloride 0.9 % 100 mL IVPB  Status:  Discontinued        2 g 200 mL/hr over 30 Minutes Intravenous Daily at bedtime 06/30/20 2006 07/03/20 0954   06/30/20 1800  Ampicillin-Sulbactam (UNASYN) 3 g in sodium chloride 0.9 % 100 mL IVPB  Status:  Discontinued        3 g 200 mL/hr over 30 Minutes Intravenous Every 6 hours 06/30/20 1703 06/30/20 1954       I have personally reviewed the following labs and images: CBC: Recent Labs  Lab 07/08/20 0302 07/09/20 0104 07/10/20 0531  WBC 4.9 4.2 5.5  HGB 11.4* 11.4* 11.2*  HCT 38.4 37.7 37.2  MCV 101.3*  99.7 98.9  PLT 111* 96* 87*   BMP &GFR Recent Labs  Lab 07/05/20 0351 07/06/20 0235 07/08/20 0302 07/09/20 0104 07/10/20 0531  NA 139 139 141 140 141  K 3.8 4.3 4.2 4.0 3.7  CL 93* 93* 93* 93* 92*  CO2 38* 35* 37* 36* 38*  GLUCOSE 242* 238* 204* 181* 229*  BUN 11 14 18 17 16   CREATININE 0.40* 0.49 <0.30* 0.32* 0.40*  CALCIUM 8.7* 8.3* 8.3* 8.3* 8.5*  MG  --   --   --  1.7 1.8  PHOS  --   --   --  2.9 3.9   Estimated Creatinine Clearance: 82.1 mL/min (A) (by C-G formula based on SCr of 0.4 mg/dL (L)). Liver & Pancreas: Recent Labs  Lab 07/08/20 0302 07/09/20 0104 07/10/20 0531  AST 20  --   --   ALT 22  --   --   ALKPHOS 47  --   --   BILITOT 0.8  --   --   PROT 4.9*  --   --   ALBUMIN 2.4* 2.3* 2.5*   No results for input(s): LIPASE, AMYLASE in the last 168 hours. Recent Labs  Lab 07/09/20 0104  AMMONIA 34   Diabetic: No results for input(s): HGBA1C in the last 72 hours. Recent Labs  Lab 07/09/20 1248 07/09/20 1714 07/09/20 2141 07/10/20 0732 07/10/20 1139  GLUCAP 148* 221* 107* 164* 140*   Cardiac Enzymes: No results for input(s): CKTOTAL, CKMB, CKMBINDEX, TROPONINI in the last 168 hours. No results for input(s): PROBNP in the last 8760 hours. Coagulation Profile: No results for input(s): INR, PROTIME in the last 168 hours. Thyroid Function Tests: No results for input(s): TSH, T4TOTAL, FREET4, T3FREE, THYROIDAB in the last 72 hours. Lipid Profile: No results for input(s): CHOL, HDL, LDLCALC, TRIG, CHOLHDL, LDLDIRECT in the last 72 hours. Anemia Panel: Recent Labs    07/09/20 0104  VITAMINB12 591   Urine analysis:    Component Value Date/Time   COLORURINE YELLOW 06/30/2020 1211   APPEARANCEUR TURBID (A) 06/30/2020 1211   LABSPEC 1.023 06/30/2020 1211   PHURINE 5.0 06/30/2020 1211   GLUCOSEU NEGATIVE 06/30/2020 1211   HGBUR NEGATIVE 06/30/2020 1211   BILIRUBINUR NEGATIVE 06/30/2020 1211   KETONESUR 5 (A) 06/30/2020 1211   PROTEINUR 100 (A)  06/30/2020 1211   NITRITE POSITIVE (A) 06/30/2020 1211   LEUKOCYTESUR LARGE (A) 06/30/2020 1211   Sepsis Labs: Invalid input(s): PROCALCITONIN, LACTICIDVEN  Microbiology:  Recent Results (from the past 240 hour(s))  Resp Panel by RT-PCR (Flu A&B, Covid) Nasopharyngeal Swab     Status: None   Collection Time: 06/30/20  3:57 PM   Specimen: Nasopharyngeal Swab; Nasopharyngeal(NP) swabs in vial transport medium  Result Value Ref Range Status   SARS Coronavirus 2 by RT PCR NEGATIVE NEGATIVE Final    Comment: (NOTE) SARS-CoV-2 target nucleic acids are NOT DETECTED.  The SARS-CoV-2 RNA is generally detectable in upper respiratory specimens during the acute phase of infection. The lowest concentration of SARS-CoV-2 viral copies this assay can detect is 138 copies/mL. A negative result does not preclude SARS-Cov-2 infection and should not be used as the sole basis for treatment or other patient management decisions. A negative result may occur with  improper specimen collection/handling, submission of specimen other than nasopharyngeal swab, presence of viral mutation(s) within the areas targeted by this assay, and inadequate number of viral copies(<138 copies/mL). A negative result must be combined with clinical observations, patient history, and epidemiological information. The expected result is Negative.  Fact Sheet for Patients:  BloggerCourse.com  Fact Sheet for Healthcare Providers:  SeriousBroker.it  This test is no t yet approved or cleared by the [place] FDA and  has been authorized for detection and/or diagnosis of SARS-CoV-2 by FDA under an Emergency Use Authorization (EUA). This EUA will remain  in effect (meaning this test can be used) for the duration of the COVID-19 declaration under Section 564(b)(1) of the Act, 21 U.S.C.section 360bbb-3(b)(1), unless the authorization is terminated  or revoked sooner.        Influenza A by PCR NEGATIVE NEGATIVE Final   Influenza B by PCR NEGATIVE NEGATIVE Final    Comment: (NOTE) The Xpert Xpress SARS-CoV-2/FLU/RSV plus assay is intended as an aid in the diagnosis of influenza from Nasopharyngeal swab specimens and should not be used as a sole basis for treatment. Nasal washings and aspirates are unacceptable for Xpert Xpress SARS-CoV-2/FLU/RSV testing.  Fact Sheet for Patients: BloggerCourse.com  Fact Sheet for Healthcare Providers: SeriousBroker.it  This test is not yet approved or cleared by the [place] FDA and has been authorized for detection and/or diagnosis of SARS-CoV-2 by FDA under an Emergency Use Authorization (EUA). This EUA will remain in effect (meaning this test can be used) for the duration of the COVID-19 declaration under Section 564(b)(1) of the Act, 21 U.S.C. section 360bbb-3(b)(1), unless the authorization is terminated or revoked.  Performed at [facility], [address] [place], [place] [address]   SARS CORONAVIRUS 2 (TAT 6-24 HRS) Nasopharyngeal Nasopharyngeal Swab     Status: None   Collection Time: 07/09/20  3:06 PM   Specimen: Nasopharyngeal Swab  Result Value Ref Range Status   SARS Coronavirus 2 NEGATIVE NEGATIVE Final    Comment: (NOTE) SARS-CoV-2 target nucleic acids are NOT DETECTED.  The SARS-CoV-2 RNA is generally detectable in upper and lower respiratory specimens during the acute phase of infection. Negative results do not preclude SARS-CoV-2 infection, do not rule out co-infections with other pathogens, and should not be used as the sole basis for treatment or other patient management decisions. Negative results must be combined with clinical observations, patient history, and epidemiological information. The expected result is Negative.  Fact Sheet for Patients: HairSlick.no  Fact Sheet for Healthcare  Providers: quierodirigir.com  This test is not yet approved or cleared by the [place] FDA and  has been authorized for detection and/or diagnosis of SARS-CoV-2 by FDA under an Emergency  Use Authorization (EUA). This EUA will remain  in effect (meaning this test can be used) for the duration of the COVID-19 declaration under Se ction 564(b)(1) of the Act, 21 U.S.C. section 360bbb-3(b)(1), unless the authorization is terminated or revoked sooner.  Performed at [facility], [address] [place], [place] [address]     Radiology Studies: No results found.    Zohaib Heeney T. Gilberte Gorley [facility]  If 7PM-7AM, please contact night-coverage [email or web address] 07/10/2020, 2:16 PM

## 2020-07-10 NOTE — Progress Notes (Signed)
Pt pulled out IV, MD made aware. And said it was ok for no IV access

## 2020-07-10 NOTE — Consult Note (Addendum)
[place]  [phone] [phone]    [place]  Referring MD:  Dr. Wendee Beavers  Reason for Referral: Thrombocytopenia  HPI: Ms. Panther is a 65 year old female with a past medical history significant for chronic hypoxic and hypercapnic respiratory failure secondary to COPD, recent diagnosis of DVT, morbid obesity, insulin-dependent diabetes mellitus, dementia, bipolar disorder.  She was sent from SNF with altered mentation.  The patient recently had a lengthy hospitalization at [facility] from 05/07/2020 through 06/12/2020 for small bowel obstruction, metabolic encephalopathy, and acute DVT of the right upper extremity diagnosed 05/19/2020.  She was started on Eliquis 5 mg twice daily for the DVT.  Small bowel suction was initially treated medically but she underwent exploratory laparotomy and subsequently developed respiratory failure postoperatively with hypotension requiring ICU admission with pressors and it appears that she was intubated.  Her hospital course was further complicated with pneumonia and UTI and she was treated with Zosyn.  She had positive blood cultures for gram-positive cocci which is likely contaminant.  The last CBC with differential performed in the hospital at [facility] prior to discharge was on 06/09/2020 which showed a WBC of 5.5, hemoglobin 9.9, and platelet count of 217,000.  Her MCV was elevated at 101.  On admission to our hospital system on 06/30/2020 her WBC was 4.0, hemoglobin 12.0, and platelet count 209,000.  Her platelets have remained normal during this hospitalization up until 07/08/2020 when her platelet count was 111,000.  It has now drifted down to 87,000 this morning her WBC remains normal and she has very mild anemia this morning with a hemoglobin of 11.2.  The patient remains on Eliquis 5 mg twice a day. She received cephalexin 500 mg every 8 hours from 07/03/2020 through 07/07/2020, doxycycline 100 mg  twice daily from 06/30/2020 through 07/05/2020. She has been receiving Depakote.  Dose has been adjusted this hospitalization and she is currently receiving 500 mg every 8 hours which was started on 07/06/2020.  It appears as though she was on this medication prior to admission but was only taking 1000 mg at bedtime. She also receiving Loxitane 20 mg daily. This was started on 06/30/2020 and it appears she was on this prior to admission. I do not see she has received any heparin or low molecular weight heparin.  I saw Ms. Kiernan in her hospital room today.  The patient is lying in the bed sleeping.  She opens her eyes but talks minimally.  No family at the bedside.  The patient was able to tell me that she is "sleepy."  I asked her she had any pain she denied this.  No bleeding was reported.  Unable to obtain a full history review of systems from the patient.  Hematology was asked see the patient for recommendations regarding her thrombocytopenia.   Past Medical History:  Diagnosis Date  . Arthritis   . Barrett esophagus   . Bipolar 1 disorder ([place])   . COPD (chronic obstructive pulmonary disease) ([place])   . Dementia ([place])   . Diabetes mellitus without complication ([place])   . GERD (gastroesophageal reflux disease)   :    Past Surgical History:  Procedure Laterality Date  . BREAST CYST EXCISION    . BREAST SURGERY    :   CURRENT MEDS: Current Facility-Administered Medications  Medication Dose Route Frequency Provider Last Rate Last Admin  . acetaminophen (TYLENOL) tablet 650 mg  650 mg Oral Q6H PRN Bonnielee Haff, MD  650 mg at 07/09/20 0921  . albuterol (PROVENTIL) (2.5 MG/3ML) 0.083% nebulizer solution 2.5 mg  2.5 mg Nebulization Q4H PRN Wynetta Fines T, MD      . apixaban Arne Cleveland) tablet 5 mg  5 mg Oral BID Wynetta Fines T, MD   5 mg at 07/09/20 2213  . atorvastatin (LIPITOR) tablet 20 mg  20 mg Oral QHS Wynetta Fines T, MD   20 mg at 07/09/20 2214  . bethanechol (URECHOLINE) tablet 10 mg   10 mg Oral TID Wendee Beavers T, MD   10 mg at 07/09/20 2214  . Chlorhexidine Gluconate Cloth 2 % PADS 6 each  6 each Topical Daily Mercy Riding, MD   6 each at 07/09/20 [phone]  . clonazePAM (KLONOPIN) disintegrating tablet 0.25 mg  0.25 mg Oral BID PRN Wendee Beavers T, MD   0.25 mg at 07/09/20 2212  . cloNIDine (CATAPRES) tablet 0.2 mg  0.2 mg Oral BID Gonfa, Taye T, MD      . diltiazem (CARDIZEM CD) 24 hr capsule 180 mg  180 mg Oral Daily Gonfa, Taye T, MD      . diltiazem (CARDIZEM) tablet 30 mg  30 mg Oral Q6H PRN Wendee Beavers T, MD   30 mg at 07/10/20 0016  . divalproex (DEPAKOTE SPRINKLE) capsule 500 mg  500 mg Oral Q8H Bonnielee Haff, MD   500 mg at 07/10/20 [phone]  . fluticasone furoate-vilanterol (BREO ELLIPTA) 100-25 MCG/INH 1 puff  1 puff Inhalation Daily Wynetta Fines T, MD   1 puff at 07/09/20 0931  . haloperidol lactate (HALDOL) injection 1-2 mg  1-2 mg Intravenous Q6H PRN Bonnielee Haff, MD      . insulin aspart (novoLOG) injection 0-20 Units  0-20 Units Subcutaneous TID WC Bonnielee Haff, MD   7 Units at 07/09/20 1736  . insulin aspart (novoLOG) injection 0-5 Units  0-5 Units Subcutaneous QHS Lequita Halt, MD   2 Units at 07/05/20 2123  . insulin detemir (LEVEMIR) injection 25 Units  25 Units Subcutaneous Daily Bonnielee Haff, MD   25 Units at 07/09/20 [phone]  . loxapine (LOXITANE) capsule 20 mg  20 mg Oral QHS Wynetta Fines T, MD   20 mg at 07/09/20 2216  . melatonin tablet 5 mg  5 mg Oral QHS Bonnielee Haff, MD   5 mg at 07/09/20 2212  . pantoprazole (PROTONIX) EC tablet 40 mg  40 mg Oral Daily Wynetta Fines T, MD   40 mg at 07/09/20 8119  . polyvinyl alcohol (LIQUIFILM TEARS) 1.4 % ophthalmic solution 1 drop  1 drop Both Eyes Q breakfast Lequita Halt, MD   1 drop at 07/09/20 [phone]  . Resource ThickenUp Clear   Oral PRN Wynetta Fines T, MD      . traMADol Veatrice Bourbon) tablet 50 mg  50 mg Oral Q8H PRN Mercy Riding, MD          Allergies  Allergen Reactions  . Ciprofloxacin Hives, Itching and  Rash  . Nitrofurantoin Hives, Itching and Rash  . Dextromethorphan-Quinidine Other (See Comments)  . Ziprasidone Hcl Other (See Comments)  :  Family History  Problem Relation Age of Onset  . Dementia Other   . Diabetes Other   :  Social History   Socioeconomic History  . Marital status: Widowed    Spouse name: Not on file  . Number of children: Not on file  . Years of education: Not on file  . Highest education level: Not on file  Occupational History  . Not on file  Tobacco Use  . Smoking status: Former Research scientist (life sciences)  . Smokeless tobacco: Never Used  Substance and Sexual Activity  . Alcohol use: No  . Drug use: No  . Sexual activity: Not on file  Other Topics Concern  . Not on file  Social History Narrative  . Not on file   Social Determinants of Health   Financial Resource Strain: Not on file  Food Insecurity: Not on file  Transportation Needs: Not on file  Physical Activity: Not on file  Stress: Not on file  Social Connections: Not on file  Intimate Partner Violence: Not on file  :  REVIEW OF SYSTEMS: Unable to obtain.  Exam: Patient Vitals for the past 24 hrs:  BP Temp Temp src Pulse Resp SpO2  07/10/20 0726 (!) 177/76 97.8 F (36.6 C) Axillary -- (!) 21 100 %  07/10/20 0500 (!) 184/115 99.3 F (37.4 C) Axillary 78 17 100 %  07/09/20 2144 (!) 185/66 99.1 F (37.3 C) Oral 77 14 100 %  07/09/20 1717 (!) 175/67 (!) 97 F (36.1 C) Oral 69 19 100 %  07/09/20 1520 (!) 174/62 -- -- -- -- --  07/09/20 1400 (!) 184/68 (!) 97.5 F (36.4 C) Oral 80 -- 90 %  07/09/20 1100 99/83 -- -- 80 18 100 %  07/09/20 0931 -- -- -- -- -- 100 %    General: Chronically ill-appearing female, no distress Eyes:  no scleral icterus.   ENT: Oral mucosa dry, no thrush or mucositis  Lymphatics:  Negative cervical, supraclavicular or axillary adenopathy.   Respiratory: Diminished breath sounds, poor inspiratory effort. Cardiovascular:  Regular rate and rhythm.  There was no pedal  edema.   GI:  abdomen was soft, flat, nontender, nondistended, without organomegaly.     Skin: No petechiae, multiple bruises noted on her bilateral arms Neuro: Opens eyes and can answer a few questions, oriented to self.  LABS:  Lab Results  Component Value Date   WBC 5.5 07/10/2020   HGB 11.2 (L) 07/10/2020   HCT 37.2 07/10/2020   PLT 87 (L) 07/10/2020   GLUCOSE 229 (H) 07/10/2020   ALT 22 07/08/2020   AST 20 07/08/2020   NA 141 07/10/2020   K 3.7 07/10/2020   CL 92 (L) 07/10/2020   CREATININE 0.40 (L) 07/10/2020   BUN 16 07/10/2020   CO2 38 (H) 07/10/2020   HGBA1C 7.2 (H) 06/30/2020    DG Chest 1 View  Result Date: 06/30/2020 CLINICAL DATA:  Altered level of consciousness, hypotension EXAM: CHEST  1 VIEW COMPARISON:  06/13/2016 FINDINGS: Single frontal view of the chest demonstrates an enlarged cardiac silhouette. There is right middle lobe consolidation compatible with airspace disease. Pleural effusion left lateral costophrenic angle. Diffuse interstitial prominence compatible with chronic scarring. There is increased central vascular congestion. No pneumothorax. IMPRESSION: 1. Right middle lobe consolidation most compatible with pneumonia. 2. Small left pleural effusion. 3. Central vascular congestion. Electronically Signed   By: Randa Ngo M.D.   On: 06/30/2020 16:30   CT Head Wo Contrast  Result Date: 06/30/2020 CLINICAL DATA:  Altered mental status. EXAM: CT HEAD WITHOUT CONTRAST TECHNIQUE: Contiguous axial images were obtained from the base of the skull through the vertex without intravenous contrast. COMPARISON:  CT head dated 02/03/2016. FINDINGS: Brain: No evidence of acute infarction, hemorrhage, hydrocephalus, extra-axial collection or mass lesion/mass effect. There is mild cerebral volume loss with associated ex vacuo dilatation. Periventricular white matter hypoattenuation likely represents  chronic small vessel ischemic disease. Vascular: No hyperdense vessel or  unexpected calcification. Skull: Normal. Negative for fracture or focal lesion. Sinuses/Orbits: Bilateral mastoid effusions, right greater than left, are noted. The paranasal sinuses are clear. The orbits appear normal. Other: None. IMPRESSION: 1. No acute intracranial process. 2. Bilateral mastoid effusions. Electronically Signed   By: Zerita Boers M.D.   On: 06/30/2020 13:21   DG Swallowing Func-Speech Pathology  Result Date: 07/02/2020 Objective Swallowing Evaluation: Type of Study: MBS-Modified Barium Swallow Study  Patient Details Name: Jaleea Alesi MRN: [id number] Date of Birth: 04/22/1956 Today's Date: 07/02/2020 Time: SLP Start Time (ACUTE ONLY): 3428 -SLP Stop Time (ACUTE ONLY): 1250 SLP Time Calculation (min) (ACUTE ONLY): 15 min Past Medical History: Past Medical History: Diagnosis Date . Arthritis  . Barrett esophagus  . Bipolar 1 disorder ([place])  . COPD (chronic obstructive pulmonary disease) ([place])  . Dementia ([place])  . Diabetes mellitus without complication ([place])  . GERD (gastroesophageal reflux disease)  Past Surgical History: Past Surgical History: Procedure Laterality Date . BREAST CYST EXCISION   . BREAST SURGERY   HPI: Pt is a 65 y.o. female with medical history significant of chronic hypoxic and hypercapnic respiratory failure secondary COPD, on Trelegy PRN +2 L PRN, HTN, recent lydiagnosed DVT, morbid obesity, IDDM, Dementia, bipolar disorder, who presented with altered mentation. CT head was negative. CXR 2/15: Right middle lobe consolidation most compatible with pneumonia. MD considered this to be residue imaging changes from a PNA pt had last month, vs a new infection and coverage was expanded for aspiration. Per EMR, pt's daughter has reported that pt has chronic dysphagia on pureed diet, with strict instruction of sitting up 30 min before feed.  No data recorded Assessment / Plan / Recommendation CHL IP CLINICAL IMPRESSIONS 07/02/2020 Clinical Impression Pt presented with advanced  degenerative changes in the cervical spine with bulky anterior endplate osteophytosis (as was noted and described by radiologist following maxillofacial CT on 02/03/16). This limited epiglottic inversion and reduced airway protection. Airway protection was inconsistently achieved by approximation of the arytenoids to the laryngeal surface of the epiglottis. Oropharyngeal dysphagia was noted characterized by reduced bolus cohesion, reduced lingual retraction, reduced pharyngeal constriction, a pharyngeal delay, reduced hyolaryngeal elevation, and reduced anterior laryngeal movement. Pt demonstrated premature spillage to the valleculae and pyriform sinuses, vallecular residue, posterior pharyngeal wall residue, penetration (PAS 3) of nectar thick liquids and aspiration (PAS 7,8) of thin and nectar thick liquids. Laryngeal invasion was due to impairments in timing of the swallow and due to impairments in timing. Aspiration inconsistently resulted in coughing, but a wet vocal quality was observed following other some other instances of aspiration. Pt was unable to follow the commands necessary for implementation of any compensatory strategies. Pt was prompted to cough, but was unable to follow this command and began laughing thereafter. Pt independently used secondary swallows which reduced pharyngeal residue to a functional level. Reduced bolus sizes reduced, but did not eliminate, laryngeal invasion. A dysphagia 2 diet with honey thick liquids is recommended at this time. SLP will follow for treatment. SLP Visit Diagnosis Dysphagia, unspecified (R13.10) Attention and concentration deficit following -- Frontal lobe and executive function deficit following -- Impact on safety and function Mild aspiration risk;Moderate aspiration risk   CHL IP TREATMENT RECOMMENDATION 07/02/2020 Treatment Recommendations Therapy as outlined in treatment plan below   Prognosis 07/02/2020 Prognosis for Safe Diet Advancement Good Barriers to  Reach Goals Time post onset;Cognitive deficits;Severity of deficits Barriers/Prognosis Comment -- CHL IP DIET  RECOMMENDATION 07/02/2020 SLP Diet Recommendations Dysphagia 2 (Fine chop) solids;Honey thick liquids Liquid Administration via Cup;Straw Medication Administration Crushed with puree Compensations Small sips/bites;Slow rate;Multiple dry swallows after each bite/sip Postural Changes Seated upright at 90 degrees   CHL IP OTHER RECOMMENDATIONS 07/02/2020 Recommended Consults -- Oral Care Recommendations Oral care BID Other Recommendations Order thickener from pharmacy   CHL IP FOLLOW UP RECOMMENDATIONS 07/02/2020 Follow up Recommendations Skilled Nursing facility   [facility] IP FREQUENCY AND DURATION 07/02/2020 Speech Therapy Frequency (ACUTE ONLY) min 2x/week Treatment Duration 2 weeks      CHL IP ORAL PHASE 07/02/2020 Oral Phase Impaired Oral - Pudding Teaspoon -- Oral - Pudding Cup -- Oral - Honey Teaspoon -- Oral - Honey Cup Decreased bolus cohesion Oral - Nectar Teaspoon -- Oral - Nectar Cup Decreased bolus cohesion Oral - Nectar Straw Decreased bolus cohesion;Premature spillage Oral - Thin Teaspoon -- Oral - Thin Cup Decreased bolus cohesion;Premature spillage Oral - Thin Straw Decreased bolus cohesion;Premature spillage Oral - Puree Decreased bolus cohesion;Premature spillage Oral - Mech Soft Decreased bolus cohesion;Impaired mastication;Premature spillage Oral - Regular -- Oral - Multi-Consistency -- Oral - Pill -- Oral Phase - Comment --  CHL IP PHARYNGEAL PHASE 07/02/2020 Pharyngeal Phase Impaired Pharyngeal- Pudding Teaspoon -- Pharyngeal -- Pharyngeal- Pudding Cup -- Pharyngeal -- Pharyngeal- Honey Teaspoon -- Pharyngeal -- Pharyngeal- Honey Cup Pharyngeal residue - valleculae;Pharyngeal residue - pyriform;Reduced pharyngeal peristalsis;Reduced epiglottic inversion;Reduced anterior laryngeal mobility;Reduced laryngeal elevation;Reduced airway/laryngeal closure;Reduced tongue base retraction Pharyngeal --  Pharyngeal- Nectar Teaspoon -- Pharyngeal -- Pharyngeal- Nectar Cup Pharyngeal residue - valleculae;Pharyngeal residue - pyriform;Reduced pharyngeal peristalsis;Reduced epiglottic inversion;Reduced anterior laryngeal mobility;Reduced laryngeal elevation;Reduced airway/laryngeal closure;Reduced tongue base retraction;Penetration/Aspiration during swallow;Penetration/Apiration after swallow Pharyngeal Material enters airway, remains ABOVE vocal cords and not ejected out;Material enters airway, passes BELOW cords and not ejected out despite cough attempt by patient;Material enters airway, passes BELOW cords without attempt by patient to eject out (silent aspiration) Pharyngeal- Nectar Straw Pharyngeal residue - valleculae;Pharyngeal residue - pyriform;Reduced pharyngeal peristalsis;Reduced epiglottic inversion;Reduced anterior laryngeal mobility;Reduced laryngeal elevation;Reduced airway/laryngeal closure;Reduced tongue base retraction;Penetration/Aspiration during swallow;Penetration/Aspiration before swallow Pharyngeal Material enters airway, passes BELOW cords without attempt by patient to eject out (silent aspiration);Material enters airway, passes BELOW cords and not ejected out despite cough attempt by patient Pharyngeal- Thin Teaspoon -- Pharyngeal -- Pharyngeal- Thin Cup Pharyngeal residue - valleculae;Pharyngeal residue - pyriform;Reduced pharyngeal peristalsis;Reduced epiglottic inversion;Reduced anterior laryngeal mobility;Reduced laryngeal elevation;Reduced airway/laryngeal closure;Reduced tongue base retraction;Penetration/Aspiration during swallow;Penetration/Apiration after swallow Pharyngeal Material enters airway, passes BELOW cords without attempt by patient to eject out (silent aspiration) Pharyngeal- Thin Straw Pharyngeal residue - valleculae;Pharyngeal residue - pyriform;Reduced pharyngeal peristalsis;Reduced epiglottic inversion;Reduced anterior laryngeal mobility;Reduced laryngeal  elevation;Reduced airway/laryngeal closure;Reduced tongue base retraction;Penetration/Aspiration during swallow Pharyngeal Material enters airway, passes BELOW cords without attempt by patient to eject out (silent aspiration) Pharyngeal- Puree Pharyngeal residue - valleculae;Pharyngeal residue - pyriform;Reduced pharyngeal peristalsis;Reduced epiglottic inversion;Reduced anterior laryngeal mobility;Reduced laryngeal elevation;Reduced airway/laryngeal closure;Reduced tongue base retraction Pharyngeal -- Pharyngeal- Mechanical Soft Pharyngeal residue - valleculae;Pharyngeal residue - pyriform;Reduced pharyngeal peristalsis;Reduced epiglottic inversion;Reduced anterior laryngeal mobility;Reduced laryngeal elevation;Reduced airway/laryngeal closure;Reduced tongue base retraction Pharyngeal -- Pharyngeal- Regular -- Pharyngeal -- Pharyngeal- Multi-consistency -- Pharyngeal -- Pharyngeal- Pill -- Pharyngeal -- Pharyngeal Comment --  Shanika I. Hardin Negus, [place], [place] Office number [phone] Pager [place] 07/02/2020, 2:11 PM              ECHOCARDIOGRAM COMPLETE  Result Date: 07/01/2020    ECHOCARDIOGRAM REPORT   Patient Name:   [facility] Date of Exam: 07/01/2020 Medical Rec #:  [id number]       Height:       62.0 in Accession #:    [id number]      Weight:       239.2 lb Date of Birth:  Oct 20, 1955       BSA:          2.063 m Patient Age:    84 years        BP:           141/52 mmHg Patient Gender: F               HR:           74 bpm. Exam Location:  Inpatient Procedure: 2D Echo, Cardiac Doppler and Color Doppler Indications:    I50.40* Unspecified combined systolic (congestive) and diastolic                 (congestive) heart failure  History:        Patient has no prior history of Echocardiogram examinations.                 COPD, Signs/Symptoms:Alzheimer's, Altered Mental Status, Dyspnea                 and Shortness of Breath; Risk Factors:Diabetes.   Sonographer:    Roseanna Rainbow RDCS Referring Phys: [id number] Lequita Halt  Sonographer Comments: Technically difficult study due to poor echo windows and patient is morbidly obese. Image acquisition challenging due to patient body habitus. Study delayed to put patient on bipap. Oxygen stats were 71 when I entered the room. Patient grabbed my probe during exam, but I was able to finish study. Patient could not follow directions. IMPRESSIONS  1. Left ventricular ejection fraction, by estimation, is 65 to 70%. The left ventricle has normal function. The left ventricle has no regional wall motion abnormalities. There is mild left ventricular hypertrophy. Left ventricular diastolic parameters are indeterminate.  2. Right ventricular systolic function is normal. The right ventricular size is normal. Tricuspid regurgitation signal is inadequate for assessing PA pressure.  3. The mitral valve is normal in structure. Trivial mitral valve regurgitation.  4. The aortic valve was not well visualized. Aortic valve regurgitation is not visualized. No aortic stenosis is present.  5. The inferior vena cava is normal in size with greater than 50% respiratory variability, suggesting right atrial pressure of 3 mmHg. FINDINGS  Left Ventricle: Left ventricular ejection fraction, by estimation, is 65 to 70%. The left ventricle has normal function. The left ventricle has no regional wall motion abnormalities. The left ventricular internal cavity size was small. There is mild left ventricular hypertrophy. Left ventricular diastolic parameters are indeterminate. Right Ventricle: The right ventricular size is normal. No increase in right ventricular wall thickness. Right ventricular systolic function is normal. Tricuspid regurgitation signal is inadequate for assessing PA pressure. Left Atrium: Left atrial size was normal in size. Right Atrium: Right atrial size was normal in size. Pericardium: There is no evidence of pericardial effusion. Mitral  Valve: The mitral valve is normal in structure. Trivial mitral valve regurgitation. Tricuspid Valve: The tricuspid valve is normal in structure. Tricuspid valve regurgitation is not demonstrated. Aortic Valve: The aortic valve was not well visualized. Aortic valve regurgitation is not visualized. No aortic stenosis is present. Pulmonic Valve: The pulmonic valve was not well visualized. Pulmonic valve regurgitation is not visualized. Aorta: The aortic root and ascending aorta are structurally normal, with  no evidence of dilitation. Venous: The inferior vena cava is normal in size with greater than 50% respiratory variability, suggesting right atrial pressure of 3 mmHg. IAS/Shunts: The interatrial septum was not well visualized.  LEFT VENTRICLE PLAX 2D LVIDd:         3.30 cm     Diastology LVIDs:         2.20 cm     LV e' medial:    5.33 cm/s LV PW:         1.20 cm     LV E/e' medial:  19.5 LV IVS:        1.50 cm     LV e' lateral:   8.05 cm/s LVOT diam:     1.70 cm     LV E/e' lateral: 12.9 LV SV:         70 LV SV Index:   34 LVOT Area:     2.27 cm  LV Volumes (MOD) LV vol d, MOD A2C: 78.5 ml LV vol d, MOD A4C: 57.6 ml LV vol s, MOD A2C: 31.2 ml LV vol s, MOD A4C: 14.8 ml LV SV MOD A2C:     47.3 ml LV SV MOD A4C:     57.6 ml LV SV MOD BP:      44.2 ml RIGHT VENTRICLE             IVC RV S prime:     16.30 cm/s  IVC diam: 1.90 cm TAPSE (M-mode): 2.6 cm LEFT ATRIUM             Index       RIGHT ATRIUM           Index LA diam:        3.80 cm 1.84 cm/m  RA Area:     12.90 cm LA Vol (A2C):   25.4 ml 12.31 ml/m RA Volume:   31.00 ml  15.03 ml/m LA Vol (A4C):   21.5 ml 10.42 ml/m LA Biplane Vol: 24.5 ml 11.88 ml/m  AORTIC VALVE LVOT Vmax:   147.00 cm/s LVOT Vmean:  95.400 cm/s LVOT VTI:    0.308 m  AORTA Ao Root diam: 2.80 cm Ao Asc diam:  3.20 cm MITRAL VALVE MV Area (PHT): 4.46 cm     SHUNTS MV Decel Time: 170 msec     Systemic VTI:  0.31 m MV E velocity: 104.00 cm/s  Systemic Diam: 1.70 cm MV A velocity: 110.00  cm/s MV E/A ratio:  0.95 Oswaldo Milian MD Electronically signed by Oswaldo Milian MD Signature Date/Time: 07/01/2020/3:20:57 PM    Final      ASSESSMENT AND PLAN:  This is a 65 year old female with 1.  Thrombocytopenia 2.  Acute metabolic encephalopathy 3.  Acute on chronic hypoxic and hypercapnic respiratory failure secondary to COPD and pneumonia 4.  Atrial fibrillation with RVR 5.  Recent UTI 6.  Dysphagia 7.  Hypertension 8.  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  Bipolar disorder 10.  Recent diagnosis of DVT on Eliquis 11.  Morbid obesity  -The patient has progressive thrombocytopenia during this hospitalization.  Her platelet count was completely normal on admission.  Suspect thrombocytopenia due to acute illness and recent infection with pneumonia/possible UTI or medications (? valproic acid, loxitane, and antibiotics).  I do not see any recent use of heparin so HIT unlikely.  We will review a peripheral blood smear and make further recommendations pending review of the peripheral blood smear. -The patient is on Eliquis  for recent diagnosis of DVT.  Eliquis may be continued but would recommend holding if platelet count drops below 50,000. -Recommend close monitoring of platelets and transfuse platelets if platelet count is less than 20,000 or active bleeding. -Management of other medical conditions per hospitalist.  Thank you for this referral.  Mikey Bussing, DNP, AGPCNP-BC, AOCNP  ADDENDUM: I saw and examined Ms.Dilone.  She was a bit tired.  I did look at her blood smear.  She did have some platelet clumps.  As such, she may have pseudothrombocytopenia.  I would definitely try to get a platelet count with citrate.  I do not see anything on the blood smear that look like an underlying malignancy.  She had several large platelets.  As such, she may have platelet destruction or platelet sequestration.  Looks like she may have had a  recent urinary tract infection.  No culture was taken so I do not know if this was a gram-negative rod in the urine.  These organisms typically can cause transient thrombocytopenia.  Again, this thrombocytopenia is likely iatrogenic.  She came in with a normal platelet count.  Para she does have her issues medically and mentally.  She is on Eliquis.  I do not see any problems with her being on Eliquis given her platelet count right now.  I have to believe that the platelet count is going to come back up.  Again the blood smear looks relatively benign.  I did not see any immature myeloid or lymphoid forms.  There are no nucleated red blood cells.  Hopefully, time will just get the platelet count better.  There really is nothing on her physical exam.  She has no obvious cirrhosis.  There is no splenomegaly.  Again, I would try to minimize her medications if possible.  She had been on antibiotics so it is possible that the antibiotics could have led to some thrombocytopenia.  We will follow along.  I do not see an indication for any invasive studies (i.e. bone marrow biopsy) at the present time.  I know that she will get outstanding care from all staff of [facility].  I know the staff is incredibly compassionate and professional.  Lattie Haw, MD  Psalms 125:1

## 2020-07-10 NOTE — Plan of Care (Signed)

## 2020-07-11 ENCOUNTER — Inpatient Hospital Stay (HOSPITAL_COMMUNITY): Payer: Medicare Other

## 2020-07-11 DIAGNOSIS — F319 Bipolar disorder, unspecified: Secondary | ICD-10-CM | POA: Diagnosis not present

## 2020-07-11 DIAGNOSIS — E1165 Type 2 diabetes mellitus with hyperglycemia: Secondary | ICD-10-CM | POA: Diagnosis not present

## 2020-07-11 DIAGNOSIS — I10 Essential (primary) hypertension: Secondary | ICD-10-CM | POA: Diagnosis not present

## 2020-07-11 LAB — CBC WITH DIFFERENTIAL/PLATELET
Abs Immature Granulocytes: 0.01 10*3/uL (ref 0.00–0.07)
Basophils Absolute: 0 10*3/uL (ref 0.0–0.1)
Basophils Relative: 0 %
Eosinophils Absolute: 0 10*3/uL (ref 0.0–0.5)
Eosinophils Relative: 1 %
HCT: 37.2 % (ref 36.0–46.0)
Hemoglobin: 12 g/dL (ref 12.0–15.0)
Immature Granulocytes: 0 %
Lymphocytes Relative: 19 %
Lymphs Abs: 0.9 10*3/uL (ref 0.7–4.0)
MCH: 31.5 pg (ref 26.0–34.0)
MCHC: 32.3 g/dL (ref 30.0–36.0)
MCV: 97.6 fL (ref 80.0–100.0)
Monocytes Absolute: 0.6 10*3/uL (ref 0.1–1.0)
Monocytes Relative: 13 %
Neutro Abs: 3 10*3/uL (ref 1.7–7.7)
Neutrophils Relative %: 67 %
Platelets: 93 10*3/uL — ABNORMAL LOW (ref 150–400)
RBC: 3.81 MIL/uL — ABNORMAL LOW (ref 3.87–5.11)
RDW: 14.6 % (ref 11.5–15.5)
WBC: 4.4 10*3/uL (ref 4.0–10.5)
nRBC: 0 % (ref 0.0–0.2)

## 2020-07-11 LAB — GLUCOSE, CAPILLARY
Glucose-Capillary: 120 mg/dL — ABNORMAL HIGH (ref 70–99)
Glucose-Capillary: 135 mg/dL — ABNORMAL HIGH (ref 70–99)
Glucose-Capillary: 143 mg/dL — ABNORMAL HIGH (ref 70–99)
Glucose-Capillary: 205 mg/dL — ABNORMAL HIGH (ref 70–99)
Glucose-Capillary: 95 mg/dL (ref 70–99)

## 2020-07-11 LAB — HEPARIN INDUCED PLATELET AB (HIT ANTIBODY): Heparin Induced Plt Ab: 0.086 OD (ref 0.000–0.400)

## 2020-07-11 LAB — PLATELET BY CITRATE

## 2020-07-11 NOTE — Progress Notes (Signed)
PROGRESS NOTE  Stephanie Rice EHM:[id number] DOB: Sep 29, 1955   PCP: [name], PA-C  Patient is from: SNF  DOA: 06/30/2020 LOS: 11  Chief complaints: Altered mental status  Brief Narrative / Interim history: 65 year old F with PMH of dementia, intellectual disability, COPD/chronic RF on trilogy vent and 2 L oxygen as needed, recent DVT, morbid obesity, IDDM-2, bipolar disorder and HTN brought to ED from SNF with altered mental status and admitted for acute metabolic encephalopathy in the setting of hypercapnia from acute on chronic respiratory failure and COPD exacerbation, and urinary tract infection.  Treated with steroid, antibiotics and BiPAP with improvement in ABG but waxing and waning mental status and some concern about behavioral issue.  Psychiatry consulted and gave recommendation.  Hospital course also complicated by acute thrombocytopenia and acute urinary retention.  Hematology consulted for thrombocytopenia.  Psychiatry also adjusted psych medications which could potentially contribute to thrombocytopenia.  She has indwelling Foley catheter for acute urinary retention   Subjective: Seen and examined earlier this morning.  No major events overnight of this morning.  She is a sleepy but wakes to voice.  She is upset that she was awoken.  Denies pain.  Objective: Vitals:   07/10/20 2111 07/11/20 0549 07/11/20 0815 07/11/20 1038  BP: (!) 135/59 137/63  (!) 157/69  Pulse: 70 69 77   Resp: 20 17 20    Temp: 98.3 F (36.8 C) 98.5 F (36.9 C)    TempSrc: Oral Oral    SpO2: 91% 98% 100%   Weight:      Height:        Intake/Output Summary (Last 24 hours) at 07/11/2020 1142 Last data filed at 07/11/2020 0700 Gross per 24 hour  Intake --  Output 675 ml  Net -675 ml   Filed Weights   07/07/20 1731  Weight: 104.9 kg    Examination: GENERAL: No apparent distress.  Nontoxic. HEENT: MMM.  Vision and hearing grossly intact.  NECK: Supple.  No apparent JVD.  RESP:  94% on 1 L.  No IWOB.  Fair aeration but limited exam due to body habitus. CVS:  RRR. Heart sounds normal.  ABD/GI/GU: BS+. Abd soft, NTND.  MSK/EXT:  Moves extremities. No apparent deformity. No edema.  SKIN: no apparent skin lesion or wound NEURO: Sleepy but wakes to voice.  Oriented to self.  Follows some commands.  No apparent focal neuro deficit but limited exam. PSYCH: Calm. Normal affect.  Procedures:  None  Microbiology summarized: COVID-19 and influenza PCR nonreactive.  Assessment & Plan: Acute metabolic encephalopathy/delirium-multifactorial including hypercapnia, UTI, CAP, underlying dementia, bipolar disorder and intellectual disability.  Reportedly waxing or waning suggesting delirium.  She was also on steroids for COPD exacerbation which could have contributed.   CT head, RPR, B12 and TSH without acute finding. Per daughter, she always say no to any question.  She is awake and alert.  Oriented to self and "hospital".  Follows some commands. -Treat treatable causes -Reorientation and delirium precautions -Minimize sedating meds. -Minimal oxygen to keep sat above 88%. -Psychiatry decrease Depakote to 500 mg twice daily in the setting of acute thrombocytopenia  Acute on chronic hypoxic and hypercapnic respiratory failure due to COPD exacerbation and CAP -Patient is on trilogy vent and oxygen 2 L as needed at baseline -ABG improved with BiPAP.  Minimal oxygen to keep saturation above 88%.  Discussed with RN. -Completed 5 days of doxycycline and 3 days of ceftriaxone -Completed course of systemic steroid -Apparently, SNF does not take patients  with trilogy vent.  So BiPAP on discharge  Atrial fibrillation with RVR: RVR resolved.  TTE without significant finding. -Changed metoprolol to Cardizem given underlying COPD -Continue Eliquis-was on this for DVT -Discontinue as needed hydralazine  Dysphagia: -Dysphagia 2 diet per SLP. -Continue aspiration  precautions  UTI-completed antibiotic course with ceftriaxone and Keflex  Oropharyngeal dysphagia -SLP recommended dysphagia 2 diet  Mild hyperkalemia -Corrected with Lokelma.    Uncontrolled hypertension: Improved. -Continue clonidine 0.2 mg twice daily -Continue Cardizem CD 180 mg daily -Discontinued as needed hydralazine  Uncontrolled IDDM-2 with hyperglycemia and neuropathy: A1c 7.2%. Recent Labs  Lab 07/10/20 0732 07/10/20 1139 07/10/20 1619 07/11/20 0041 07/11/20 0740  GLUCAP 164* 140* 114* 135* 120*  -Continue current insulin regimen  History of bipolar disorder -Psychiatry decreased Depakote to 500 mg twice daily (home dose) in the setting of thrombocytopenia. -Patient is also on loxapine 20 mg daily.  Since she is on 10 mg daily SNF.  Psychiatry to review.  History of DVT -Continue Eliquis.  Acute urinary retention: Review of a flow chart shows about 700 cc urine output on I&O cath over the last few days despite bethanechol. -Indwelling Foley catheter  -Continue p.o. bethanechol -Renal ultrasound  Thrombocytopenia-thought to be seizure thrombocytopenia from platelet clumping based on blood smear per hematology.  Also on psych medications which could potentially contribute.  Recent Labs  Lab 07/08/20 0302 07/09/20 0104 07/10/20 0531  PLT 111* 96* 87*  -Follow platelet bicitrate and CBC with differential from this morning -Hematology following   Morbid obesity Body mass index is 38.48 kg/m. Nutrition Problem: Increased nutrient needs Etiology: acute illness Signs/Symptoms: estimated needs Interventions: Refer to RD note for recommendations   DVT prophylaxis:   apixaban (ELIQUIS) tablet 5 mg  Code Status: DNR/DNI Family Communication: Updated patient's daughter over the phone Level of care: Telemetry Medical Status is: Inpatient  Remains inpatient appropriate because:Ongoing diagnostic testing needed not appropriate for outpatient work up and  Unsafe d/c plan   Dispo: The patient is from: SNF              Anticipated d/c is to: SNF              Anticipated d/c date is: 2 days              Patient currently is not medically stable to d/c.   Difficult to place patient No       Consultants:  Psychiatry Hematology-following   Sch Meds:  Scheduled Meds: . apixaban  5 mg Oral BID  . atorvastatin  20 mg Oral QHS  . bethanechol  25 mg Oral TID  . Chlorhexidine Gluconate Cloth  6 each Topical Daily  . cloNIDine  0.2 mg Oral BID  . diltiazem  180 mg Oral Daily  . divalproex  500 mg Oral Q12H  . fluticasone furoate-vilanterol  1 puff Inhalation Daily  . insulin aspart  0-20 Units Subcutaneous TID WC  . insulin aspart  0-5 Units Subcutaneous QHS  . insulin detemir  25 Units Subcutaneous Daily  . loxapine  20 mg Oral QHS  . melatonin  5 mg Oral QHS  . pantoprazole  40 mg Oral Daily  . polyvinyl alcohol  1 drop Both Eyes Q breakfast   Continuous Infusions: PRN Meds:.acetaminophen, albuterol, clonazepam, diltiazem, polyethylene glycol, Resource ThickenUp Clear, senna-docusate, traMADol  Antimicrobials: Anti-infectives (From admission, onward)   Start     Dose/Rate Route Frequency Ordered Stop   07/03/20 1400  cephALEXin (KEFLEX) capsule  500 mg        500 mg Oral Every 8 hours 07/03/20 0954 07/07/20 1146   06/30/20 2200  doxycycline (VIBRA-TABS) tablet 100 mg        100 mg Oral Every 12 hours 06/30/20 1612 07/05/20 2159   06/30/20 2030  cefTRIAXone (ROCEPHIN) 2 g in sodium chloride 0.9 % 100 mL IVPB  Status:  Discontinued        2 g 200 mL/hr over 30 Minutes Intravenous Daily at bedtime 06/30/20 2006 07/03/20 0954   06/30/20 1800  Ampicillin-Sulbactam (UNASYN) 3 g in sodium chloride 0.9 % 100 mL IVPB  Status:  Discontinued        3 g 200 mL/hr over 30 Minutes Intravenous Every 6 hours 06/30/20 1703 06/30/20 1954       I have personally reviewed the following labs and images: CBC: Recent Labs  Lab 07/08/20 0302  07/09/20 0104 07/10/20 0531  WBC 4.9 4.2 5.5  HGB 11.4* 11.4* 11.2*  HCT 38.4 37.7 37.2  MCV 101.3* 99.7 98.9  PLT 111* 96* 87*   BMP &GFR Recent Labs  Lab 07/05/20 0351 07/06/20 0235 07/08/20 0302 07/09/20 0104 07/10/20 0531  NA 139 139 141 140 141  K 3.8 4.3 4.2 4.0 3.7  CL 93* 93* 93* 93* 92*  CO2 38* 35* 37* 36* 38*  GLUCOSE 242* 238* 204* 181* 229*  BUN 11 14 18 17 16   CREATININE 0.40* 0.49 <0.30* 0.32* 0.40*  CALCIUM 8.7* 8.3* 8.3* 8.3* 8.5*  MG  --   --   --  1.7 1.8  PHOS  --   --   --  2.9 3.9   Estimated Creatinine Clearance: 82.1 mL/min (A) (by C-G formula based on SCr of 0.4 mg/dL (L)). Liver & Pancreas: Recent Labs  Lab 07/08/20 0302 07/09/20 0104 07/10/20 0531  AST 20  --   --   ALT 22  --   --   ALKPHOS 47  --   --   BILITOT 0.8  --   --   PROT 4.9*  --   --   ALBUMIN 2.4* 2.3* 2.5*   No results for input(s): LIPASE, AMYLASE in the last 168 hours. Recent Labs  Lab 07/09/20 0104  AMMONIA 34   Diabetic: No results for input(s): HGBA1C in the last 72 hours. Recent Labs  Lab 07/10/20 0732 07/10/20 1139 07/10/20 1619 07/11/20 0041 07/11/20 0740  GLUCAP 164* 140* 114* 135* 120*   Cardiac Enzymes: No results for input(s): CKTOTAL, CKMB, CKMBINDEX, TROPONINI in the last 168 hours. No results for input(s): PROBNP in the last 8760 hours. Coagulation Profile: No results for input(s): INR, PROTIME in the last 168 hours. Thyroid Function Tests: No results for input(s): TSH, T4TOTAL, FREET4, T3FREE, THYROIDAB in the last 72 hours. Lipid Profile: No results for input(s): CHOL, HDL, LDLCALC, TRIG, CHOLHDL, LDLDIRECT in the last 72 hours. Anemia Panel: Recent Labs    07/09/20 0104  VITAMINB12 591   Urine analysis:    Component Value Date/Time   COLORURINE YELLOW 06/30/2020 1211   APPEARANCEUR TURBID (A) 06/30/2020 1211   LABSPEC 1.023 06/30/2020 1211   PHURINE 5.0 06/30/2020 1211   GLUCOSEU NEGATIVE 06/30/2020 1211   HGBUR NEGATIVE  06/30/2020 1211   BILIRUBINUR NEGATIVE 06/30/2020 1211   KETONESUR 5 (A) 06/30/2020 1211   PROTEINUR 100 (A) 06/30/2020 1211   NITRITE POSITIVE (A) 06/30/2020 1211   LEUKOCYTESUR LARGE (A) 06/30/2020 1211   Sepsis Labs: Invalid input(s): PROCALCITONIN, LACTICIDVEN  Microbiology: Recent  Results (from the past 240 hour(s))  SARS CORONAVIRUS 2 (TAT 6-24 HRS) Nasopharyngeal Nasopharyngeal Swab     Status: None   Collection Time: 07/09/20  3:06 PM   Specimen: Nasopharyngeal Swab  Result Value Ref Range Status   SARS Coronavirus 2 NEGATIVE NEGATIVE Final    Comment: (NOTE) SARS-CoV-2 target nucleic acids are NOT DETECTED.  The SARS-CoV-2 RNA is generally detectable in upper and lower respiratory specimens during the acute phase of infection. Negative results do not preclude SARS-CoV-2 infection, do not rule out co-infections with other pathogens, and should not be used as the sole basis for treatment or other patient management decisions. Negative results must be combined with clinical observations, patient history, and epidemiological information. The expected result is Negative.  Fact Sheet for Patients: HairSlick.no  Fact Sheet for Healthcare Providers: quierodirigir.com  This test is not yet approved or cleared by the [place] FDA and  has been authorized for detection and/or diagnosis of SARS-CoV-2 by FDA under an Emergency Use Authorization (EUA). This EUA will remain  in effect (meaning this test can be used) for the duration of the COVID-19 declaration under Se ction 564(b)(1) of the Act, 21 U.S.C. section 360bbb-3(b)(1), unless the authorization is terminated or revoked sooner.  Performed at [facility], [address], [place], [place] [address]     Radiology Studies: No results found.    Prem Coykendall T. Annastacia Duba [facility]  If 7PM-7AM, please contact night-coverage [email or web address] 07/11/2020,  11:42 AM

## 2020-07-11 NOTE — Progress Notes (Signed)
No bipap for tonight, due to pt wearing restraints.

## 2020-07-11 NOTE — Progress Notes (Signed)
Return call to [place] at [facility] [phone]. Requested update if patient was going to be discharged this weekend. Informed no updates or notifications made at this time of discharge.

## 2020-07-11 NOTE — Progress Notes (Addendum)
Texted Dr Rachael Darby w/ results of bladder scan. Ordered to strait cath.   0700  Strait cath resulted in of amber colored urine.

## 2020-07-12 DIAGNOSIS — J441 Chronic obstructive pulmonary disease with (acute) exacerbation: Secondary | ICD-10-CM | POA: Diagnosis not present

## 2020-07-12 DIAGNOSIS — D696 Thrombocytopenia, unspecified: Secondary | ICD-10-CM | POA: Diagnosis not present

## 2020-07-12 DIAGNOSIS — R5383 Other fatigue: Secondary | ICD-10-CM | POA: Diagnosis not present

## 2020-07-12 LAB — CBC WITH DIFFERENTIAL/PLATELET
Abs Immature Granulocytes: 0.01 10*3/uL (ref 0.00–0.07)
Basophils Absolute: 0 10*3/uL (ref 0.0–0.1)
Basophils Relative: 0 %
Eosinophils Absolute: 0 10*3/uL (ref 0.0–0.5)
Eosinophils Relative: 0 %
HCT: 36.9 % (ref 36.0–46.0)
Hemoglobin: 11.3 g/dL — ABNORMAL LOW (ref 12.0–15.0)
Immature Granulocytes: 0 %
Lymphocytes Relative: 19 %
Lymphs Abs: 0.9 10*3/uL (ref 0.7–4.0)
MCH: 30.1 pg (ref 26.0–34.0)
MCHC: 30.6 g/dL (ref 30.0–36.0)
MCV: 98.4 fL (ref 80.0–100.0)
Monocytes Absolute: 0.7 10*3/uL (ref 0.1–1.0)
Monocytes Relative: 14 %
Neutro Abs: 3.3 10*3/uL (ref 1.7–7.7)
Neutrophils Relative %: 67 %
Platelets: 91 10*3/uL — ABNORMAL LOW (ref 150–400)
RBC: 3.75 MIL/uL — ABNORMAL LOW (ref 3.87–5.11)
RDW: 14.6 % (ref 11.5–15.5)
WBC: 5 10*3/uL (ref 4.0–10.5)
nRBC: 0 % (ref 0.0–0.2)

## 2020-07-12 LAB — GLUCOSE, CAPILLARY
Glucose-Capillary: 80 mg/dL (ref 70–99)
Glucose-Capillary: 122 mg/dL — ABNORMAL HIGH (ref 70–99)
Glucose-Capillary: 213 mg/dL — ABNORMAL HIGH (ref 70–99)
Glucose-Capillary: 214 mg/dL — ABNORMAL HIGH (ref 70–99)

## 2020-07-12 LAB — PLATELET BY CITRATE: Platelet CT in Citrate: 78

## 2020-07-12 NOTE — TOC Progression Note (Signed)
Transition of Care [facility]) - Progression Note    Patient Details  Name: Stephanie Rice MRN: [id number] Date of Birth: 11-09-1955  [facility] CM/SW Contact  Carley Hammed, [place] Phone Number: 07/12/2020, 10:37 AM  Clinical Narrative:    CSW followed up with [employer] about the skilled nursing request for this pt to go to [facility]. [employer] rep noted it was still pending and they would be reaching out to set up a peer to peer. SW will continue to follow for DC planning.   Expected Discharge Plan: Skilled Nursing Facility Barriers to Discharge: Continued Medical Work up  Expected Discharge Plan and Services Expected Discharge Plan: Skilled Nursing Facility     Post Acute Care Choice:  (daughter requesting LTAC) Living arrangements for the past 2 months: Skilled Nursing Facility (was hospitalized at [facility] for 30+ days prior to SNF)                                       Social Determinants of Health (SDOH) Interventions    Readmission Risk Interventions No flowsheet data found.

## 2020-07-12 NOTE — Progress Notes (Signed)
PROGRESS NOTE  Stephanie Rice XNT:[id number] DOB: Oct 07, 1955   PCP: [name], PA-C  Patient is from: SNF  DOA: 06/30/2020 LOS: 12  Chief complaints: Altered mental status  Brief Narrative / Interim history: 65 year old F with PMH of dementia, intellectual disability, COPD/chronic RF on trilogy vent and 2 L oxygen as needed, recent DVT, morbid obesity, IDDM-2, bipolar disorder and HTN brought to ED from SNF with altered mental status and admitted for acute metabolic encephalopathy in the setting of hypercapnia from acute on chronic respiratory failure and COPD exacerbation, and urinary tract infection.  Treated with steroid, antibiotics and BiPAP with improvement in ABG but waxing and waning mental status and some concern about behavioral issue.  Psychiatry consulted and gave recommendation.  Hospital course also complicated by acute thrombocytopenia and acute urinary retention.  Hematology consulted for thrombocytopenia.  Psychiatry also adjusted psych medications which could potentially contribute to thrombocytopenia.  She has indwelling Foley catheter for acute urinary retention   Subjective: Seen and examined.  She is sleepy but wakes to voice and is trying to talk as well.  She seems to be disoriented.  But comfortable.  No complaints.  Objective: Vitals:   07/11/20 2329 07/12/20 0406 07/12/20 0941 07/12/20 1230  BP: (!) 132/47 135/61 (!) 140/54 (!) 136/42  Pulse: 77 69 72 73  Resp: 20 19 16  (!) 22  Temp: 99.2 F (37.3 C) 98.2 F (36.8 C) 98.5 F (36.9 C) 98.3 F (36.8 C)  TempSrc: Axillary Oral Oral Axillary  SpO2: 98% 99% 99% 100%  Weight:      Height:        Intake/Output Summary (Last 24 hours) at 07/12/2020 1426 Last data filed at 07/12/2020 0600 Gross per 24 hour  Intake --  Output 800 ml  Net -800 ml   Filed Weights   07/07/20 1731  Weight: 104.9 kg    Examination:  General exam: Appears calm and comfortable  Respiratory system: Clear to  auscultation. Respiratory effort normal. Cardiovascular system: S1 & S2 heard, RRR. No JVD, murmurs, rubs, gallops or clicks. No pedal edema. Gastrointestinal system: Abdomen is nondistended, soft and nontender. No organomegaly or masses felt. Normal bowel sounds heard. Central nervous system: Sleepy and disoriented.  No focal deficit.  Procedures:  None  Microbiology summarized: COVID-19 and influenza PCR nonreactive.  Assessment & Plan: Acute metabolic encephalopathy/delirium-multifactorial including hypercapnia, UTI, CAP, underlying dementia, bipolar disorder and intellectual disability.  Reportedly waxing or waning suggesting delirium.  She was also on steroids for COPD exacerbation which could have contributed.   CT head, RPR, B12 and TSH without acute finding. Per daughter, she always say no to any question.  She is sleepy but arousable.  Not oriented. -Treat treatable causes -Reorientation and delirium precautions -Minimize sedating meds. -Minimal oxygen to keep sat above 88%. -Psychiatry decreased Depakote to 500 mg twice daily in the setting of acute thrombocytopenia  Acute on chronic hypoxic and hypercapnic respiratory failure due to COPD exacerbation and CAP -Patient is on trilogy vent and oxygen 2 L as needed at baseline -ABG improved with BiPAP.  Minimal oxygen to keep saturation above 88%.  -Completed 5 days of doxycycline and 3 days of ceftriaxone -Completed course of systemic steroid -Apparently, SNF does not take patients with trilogy vent.  So BiPAP on discharge  Atrial fibrillation with RVR: RVR resolved.  TTE without significant finding. -Changed metoprolol to Cardizem given underlying COPD -Continue Eliquis-was on this for DVT -Discontinue as needed hydralazine  Dysphagia: -Dysphagia 2 diet per  SLP. -Continue aspiration precautions  UTI-completed antibiotic course with ceftriaxone and Keflex  Oropharyngeal dysphagia -SLP recommended dysphagia 2  diet  Mild hyperkalemia -Corrected with Lokelma.    Uncontrolled hypertension: Improved. -Continue clonidine 0.2 mg twice daily -Continue Cardizem CD 180 mg daily -Discontinued as needed hydralazine  Uncontrolled IDDM-2 with hyperglycemia and neuropathy: A1c 7.2%. Recent Labs  Lab 07/11/20 1205 07/11/20 1607 07/11/20 2327 07/12/20 0643 07/12/20 1224  GLUCAP 205* 143* 95 80 122*  -Continue current insulin regimen  History of bipolar disorder -Psychiatry decreased Depakote to 500 mg twice daily (home dose) in the setting of thrombocytopenia. -Patient is also on loxapine 20 mg daily.  Since she is on 10 mg daily SNF.  Psychiatry to review.  History of DVT -Continue Eliquis.  Acute urinary retention: Continue indwelling Foley catheter -Continue p.o. bethanechol -Renal ultrasound  Thrombocytopenia-thought to be seizure thrombocytopenia from platelet clumping based on blood smear per hematology.  Also on psych medications which could potentially contribute.  Recent Labs  Lab 07/08/20 0302 07/09/20 0104 07/10/20 0531 07/11/20 1312 07/12/20 0124  PLT 111* 96* 87* 93* 91*  -Normal platelet bicitrate.  Platelets stable.  Hematology signed off.  May follow-up as outpatient with hematology if needed   Morbid obesity Body mass index is 38.48 kg/m. Nutrition Problem: Increased nutrient needs Etiology: acute illness Signs/Symptoms: estimated needs Interventions: Refer to RD note for recommendations   DVT prophylaxis:   apixaban (ELIQUIS) tablet 5 mg  Code Status: DNR/DNI Family Communication: None at bedside. Level of care: Telemetry Medical Status is: Inpatient  Remains inpatient appropriate because:Ongoing diagnostic testing needed not appropriate for outpatient work up and Unsafe d/c plan   Dispo: The patient is from: SNF              Anticipated d/c is to: SNF              Anticipated d/c date is: 2 days              Patient currently is  medically stable to  d/c.   Difficult to place patient No       Consultants:  Psychiatry Hematology-following   Sch Meds:  Scheduled Meds: . apixaban  5 mg Oral BID  . atorvastatin  20 mg Oral QHS  . bethanechol  25 mg Oral TID  . Chlorhexidine Gluconate Cloth  6 each Topical Daily  . cloNIDine  0.2 mg Oral BID  . diltiazem  180 mg Oral Daily  . divalproex  500 mg Oral Q12H  . fluticasone furoate-vilanterol  1 puff Inhalation Daily  . insulin aspart  0-20 Units Subcutaneous TID WC  . insulin aspart  0-5 Units Subcutaneous QHS  . insulin detemir  25 Units Subcutaneous Daily  . loxapine  20 mg Oral QHS  . melatonin  5 mg Oral QHS  . pantoprazole  40 mg Oral Daily  . polyvinyl alcohol  1 drop Both Eyes Q breakfast   Continuous Infusions: PRN Meds:.acetaminophen, albuterol, clonazepam, diltiazem, polyethylene glycol, Resource ThickenUp Clear, senna-docusate, traMADol  Antimicrobials: Anti-infectives (From admission, onward)   Start     Dose/Rate Route Frequency Ordered Stop   07/03/20 1400  cephALEXin (KEFLEX) capsule 500 mg        500 mg Oral Every 8 hours 07/03/20 0954 07/07/20 1146   06/30/20 2200  doxycycline (VIBRA-TABS) tablet 100 mg        100 mg Oral Every 12 hours 06/30/20 1612 07/05/20 2159   06/30/20 2030  cefTRIAXone (ROCEPHIN) 2  g in sodium chloride 0.9 % 100 mL IVPB  Status:  Discontinued        2 g 200 mL/hr over 30 Minutes Intravenous Daily at bedtime 06/30/20 2006 07/03/20 0954   06/30/20 1800  Ampicillin-Sulbactam (UNASYN) 3 g in sodium chloride 0.9 % 100 mL IVPB  Status:  Discontinued        3 g 200 mL/hr over 30 Minutes Intravenous Every 6 hours 06/30/20 1703 06/30/20 1954       I have personally reviewed the following labs and images: CBC: Recent Labs  Lab 07/08/20 0302 07/09/20 0104 07/10/20 0531 07/11/20 1312 07/12/20 0124  WBC 4.9 4.2 5.5 4.4 5.0  NEUTROABS  --   --   --  3.0 3.3  HGB 11.4* 11.4* 11.2* 12.0 11.3*  HCT 38.4 37.7 37.2 37.2 36.9  MCV 101.3*  99.7 98.9 97.6 98.4  PLT 111* 96* 87* 93* 91*   BMP &GFR Recent Labs  Lab 07/06/20 0235 07/08/20 0302 07/09/20 0104 07/10/20 0531  NA 139 141 140 141  K 4.3 4.2 4.0 3.7  CL 93* 93* 93* 92*  CO2 35* 37* 36* 38*  GLUCOSE 238* 204* 181* 229*  BUN 14 18 17 16   CREATININE 0.49 <0.30* 0.32* 0.40*  CALCIUM 8.3* 8.3* 8.3* 8.5*  MG  --   --  1.7 1.8  PHOS  --   --  2.9 3.9   Estimated Creatinine Clearance: 82.1 mL/min (A) (by C-G formula based on SCr of 0.4 mg/dL (L)). Liver & Pancreas: Recent Labs  Lab 07/08/20 0302 07/09/20 0104 07/10/20 0531  AST 20  --   --   ALT 22  --   --   ALKPHOS 47  --   --   BILITOT 0.8  --   --   PROT 4.9*  --   --   ALBUMIN 2.4* 2.3* 2.5*   No results for input(s): LIPASE, AMYLASE in the last 168 hours. Recent Labs  Lab 07/09/20 0104  AMMONIA 34   Diabetic: No results for input(s): HGBA1C in the last 72 hours. Recent Labs  Lab 07/11/20 1205 07/11/20 1607 07/11/20 2327 07/12/20 0643 07/12/20 1224  GLUCAP 205* 143* 95 80 122*   Cardiac Enzymes: No results for input(s): CKTOTAL, CKMB, CKMBINDEX, TROPONINI in the last 168 hours. No results for input(s): PROBNP in the last 8760 hours. Coagulation Profile: No results for input(s): INR, PROTIME in the last 168 hours. Thyroid Function Tests: No results for input(s): TSH, T4TOTAL, FREET4, T3FREE, THYROIDAB in the last 72 hours. Lipid Profile: No results for input(s): CHOL, HDL, LDLCALC, TRIG, CHOLHDL, LDLDIRECT in the last 72 hours. Anemia Panel: No results for input(s): VITAMINB12, FOLATE, FERRITIN, TIBC, IRON, RETICCTPCT in the last 72 hours. Urine analysis:    Component Value Date/Time   COLORURINE YELLOW 06/30/2020 1211   APPEARANCEUR TURBID (A) 06/30/2020 1211   LABSPEC 1.023 06/30/2020 1211   PHURINE 5.0 06/30/2020 1211   GLUCOSEU NEGATIVE 06/30/2020 1211   HGBUR NEGATIVE 06/30/2020 1211   BILIRUBINUR NEGATIVE 06/30/2020 1211   KETONESUR 5 (A) 06/30/2020 1211   PROTEINUR 100  (A) 06/30/2020 1211   NITRITE POSITIVE (A) 06/30/2020 1211   LEUKOCYTESUR LARGE (A) 06/30/2020 1211   Sepsis Labs: Invalid input(s): PROCALCITONIN, LACTICIDVEN  Microbiology: Recent Results (from the past 240 hour(s))  SARS CORONAVIRUS 2 (TAT 6-24 HRS) Nasopharyngeal Nasopharyngeal Swab     Status: None   Collection Time: 07/09/20  3:06 PM   Specimen: Nasopharyngeal Swab  Result Value Ref Range  Status   SARS Coronavirus 2 NEGATIVE NEGATIVE Final    Comment: (NOTE) SARS-CoV-2 target nucleic acids are NOT DETECTED.  The SARS-CoV-2 RNA is generally detectable in upper and lower respiratory specimens during the acute phase of infection. Negative results do not preclude SARS-CoV-2 infection, do not rule out co-infections with other pathogens, and should not be used as the sole basis for treatment or other patient management decisions. Negative results must be combined with clinical observations, patient history, and epidemiological information. The expected result is Negative.  Fact Sheet for Patients: HairSlick.nohttps://www.fda.gov/media/138098/download  Fact Sheet for Healthcare Providers: quierodirigir.comhttps://www.fda.gov/media/138095/download  This test is not yet approved or cleared by the [place] FDA and  has been authorized for detection and/or diagnosis of SARS-CoV-2 by FDA under an Emergency Use Authorization (EUA). This EUA will remain  in effect (meaning this test can be used) for the duration of the COVID-19 declaration under Se ction 564(b)(1) of the Act, 21 U.S.C. section 360bbb-3(b)(1), unless the authorization is terminated or revoked sooner.  Performed at [facility], [address] [place], [place] [address]     Radiology Studies: US RENAL  Result Date: 07/11/2020 CLINICAL DATA:  Acute urinary retention. EXAM: RENAL / URINARY TRACT ULTRASOUND COMPLETE COMPARISON:  February 08, 2016. FINDINGS: Right Kidney: Renal measurements: 10.3 x 5.7 x 4.8 cm = volume: 146 mL.  Echogenicity within normal limits. No mass or hydronephrosis visualized. Left Kidney: Renal measurements: 11.4 x 6.3 x 6.2 cm = volume: 235 mL. Echogenicity within normal limits. No mass or hydronephrosis visualized. Bladder: Not visualized due to persistent patient motion. Other: None. IMPRESSION: Normal renal ultrasound. Electronically Signed   By: Lupita RaiderJames  Green Jr M.D.   On: 07/11/2020 17:45   Hughie Clossavi Mariadelcarmen Corella, MD [facility]  If 7PM-7AM, please contact night-coverage [email or web address] 07/12/2020, 2:26 PM

## 2020-07-12 NOTE — Progress Notes (Signed)
Overall, the platelet count is pretty stable.  Her platelet count was about 90,000.  Again, I had believe this is going to be something related to this past urinary tract infection that she had.  Could be from antibiotics.  She is not had any bleeding.  She is on Eliquis.  I just think that time will improve this.  Again her platelet count was fine when she came in the hospital.  If she is can be discharged, we can certainly follow-up as an outpatient if necessary.  Christin Bach, MD  Proverbs 27:17

## 2020-07-13 DIAGNOSIS — J441 Chronic obstructive pulmonary disease with (acute) exacerbation: Secondary | ICD-10-CM | POA: Diagnosis not present

## 2020-07-13 LAB — CBC WITH DIFFERENTIAL/PLATELET
Abs Immature Granulocytes: 0.02 10*3/uL (ref 0.00–0.07)
Basophils Absolute: 0 10*3/uL (ref 0.0–0.1)
Basophils Relative: 0 %
Eosinophils Absolute: 0 10*3/uL (ref 0.0–0.5)
Eosinophils Relative: 1 %
HCT: 36.3 % (ref 36.0–46.0)
Hemoglobin: 11.3 g/dL — ABNORMAL LOW (ref 12.0–15.0)
Immature Granulocytes: 1 %
Lymphocytes Relative: 31 %
Lymphs Abs: 1.2 10*3/uL (ref 0.7–4.0)
MCH: 30.1 pg (ref 26.0–34.0)
MCHC: 31.1 g/dL (ref 30.0–36.0)
MCV: 96.5 fL (ref 80.0–100.0)
Monocytes Absolute: 0.5 10*3/uL (ref 0.1–1.0)
Monocytes Relative: 13 %
Neutro Abs: 2.1 10*3/uL (ref 1.7–7.7)
Neutrophils Relative %: 54 %
Platelets: 88 10*3/uL — ABNORMAL LOW (ref 150–400)
RBC: 3.76 MIL/uL — ABNORMAL LOW (ref 3.87–5.11)
RDW: 14.6 % (ref 11.5–15.5)
WBC: 3.8 10*3/uL — ABNORMAL LOW (ref 4.0–10.5)
nRBC: 0 % (ref 0.0–0.2)

## 2020-07-13 LAB — PLATELET BY CITRATE: Platelet CT in Citrate: 75.9

## 2020-07-13 LAB — GLUCOSE, CAPILLARY
Glucose-Capillary: 138 mg/dL — ABNORMAL HIGH (ref 70–99)
Glucose-Capillary: 160 mg/dL — ABNORMAL HIGH (ref 70–99)
Glucose-Capillary: 214 mg/dL — ABNORMAL HIGH (ref 70–99)

## 2020-07-13 MED ORDER — TAMSULOSIN HCL 0.4 MG PO CAPS
0.4000 mg | ORAL_CAPSULE | Freq: Every day | ORAL | Status: DC
  Administered 2020-07-13 – 2020-07-21 (×9): 0.4 mg via ORAL
  Filled 2020-07-13 (×10): qty 1

## 2020-07-13 NOTE — Progress Notes (Signed)
PROGRESS NOTE  Stephanie Rice ZOX:[id number] DOB: 1955-12-11   PCP: Coralee Ruduran, Michael R, PA-C  Patient is from: SNF  DOA: 06/30/2020 LOS: 13  Chief complaints: Altered mental status  Brief Narrative / Interim history: 65 year old F with PMH of dementia, intellectual disability, COPD/chronic RF on trilogy vent and 2 L oxygen as needed, recent DVT, morbid obesity, IDDM-2, bipolar disorder and HTN brought to ED from SNF with altered mental status and admitted for acute metabolic encephalopathy in the setting of hypercapnia from acute on chronic respiratory failure and COPD exacerbation, and urinary tract infection.  Treated with steroid, antibiotics and BiPAP with improvement in ABG but waxing and waning mental status and some concern about behavioral issue.  Psychiatry consulted and gave recommendation.  Hospital course also complicated by acute thrombocytopenia and acute urinary retention.  Hematology consulted for thrombocytopenia.  Psychiatry also adjusted psych medications which could potentially contribute to thrombocytopenia.  She has indwelling Foley catheter for acute urinary retention   Subjective: Patient seen and examined.  Once again she was sleepy but easily arousable but at the same time, she was easily agitated and feisty.  She was aware that she was in the hospital but could not tell me anything else.  Did not want to have any other conversation.  Objective: Vitals:   07/12/20 2308 07/12/20 2316 07/13/20 0317 07/13/20 0755  BP: (!) 171/63     Pulse:   76   Resp:   20   Temp: 98.5 F (36.9 C)  98.3 F (36.8 C)   TempSrc: Axillary  Axillary   SpO2: 94% 92% 90% 99%  Weight:      Height:        Intake/Output Summary (Last 24 hours) at 07/13/2020 1323 Last data filed at 07/13/2020 0318 Gross per 24 hour  Intake 240 ml  Output 320 ml  Net -80 ml   Filed Weights   07/07/20 1731  Weight: 104.9 kg    Examination:  General exam: Appears calm and comfortable but  sleepy Respiratory system: Clear to auscultation. Respiratory effort normal. Cardiovascular system: S1 & S2 heard, RRR. No JVD, murmurs, rubs, gallops or clicks. No pedal edema. Gastrointestinal system: Abdomen is nondistended, soft and nontender. No organomegaly or masses felt. Normal bowel sounds heard. Central nervous system: Sleepy but oriented x1.  No focal deficit.  Just generalized weakness. Skin: No rashes, lesions or ulcers.  Psychiatry: Judgement and insight appear poor Procedures:  None  Microbiology summarized: COVID-19 and influenza PCR nonreactive.  Assessment & Plan: Acute metabolic encephalopathy/delirium-multifactorial including hypercapnia, UTI, CAP, underlying dementia, bipolar disorder and intellectual disability.  Reportedly waxing or waning suggesting delirium.  She was also on steroids for COPD exacerbation which could have contributed.   CT head, RPR, B12 and TSH without acute finding. Per daughter, she always say no to any question.  She is sleepy but arousable.  Oriented x1 only. -Treat treatable causes -Reorientation and delirium precautions -Minimize sedating meds. -Minimal oxygen to keep sat above 88%. -Psychiatry decreased Depakote to 500 mg twice daily in the setting of acute thrombocytopenia  Acute on chronic hypoxic and hypercapnic respiratory failure due to COPD exacerbation and CAP -Patient is on trilogy vent and oxygen 2 L as needed at baseline -ABG improved with BiPAP.  Minimal oxygen to keep saturation above 88%.  -Completed 5 days of doxycycline and 3 days of ceftriaxone -Completed course of systemic steroid -Apparently, SNF does not take patients with trilogy vent.  So BiPAP on discharge  Atrial fibrillation with  RVR: RVR resolved.  TTE without significant finding. -Changed metoprolol to Cardizem given underlying COPD -Continue Eliquis-was on this for DVT -Discontinue as needed hydralazine  Dysphagia: -Dysphagia 2 diet per SLP. -Continue  aspiration precautions  UTI-completed antibiotic course with ceftriaxone and Keflex  Oropharyngeal dysphagia -SLP recommended dysphagia 2 diet  Mild hyperkalemia -Corrected with Lokelma.    Uncontrolled hypertension: Improved. -Continue clonidine 0.2 mg twice daily -Continue Cardizem CD 180 mg daily -Discontinued as needed hydralazine  Uncontrolled IDDM-2 with hyperglycemia and neuropathy: A1c 7.2%. Recent Labs  Lab 07/12/20 1224 07/12/20 1615 07/12/20 2004 07/13/20 0615 07/13/20 1113  GLUCAP 122* 213* 214* 138* 160*  -Continue current insulin regimen  History of bipolar disorder -Psychiatry decreased Depakote to 500 mg twice daily (home dose) in the setting of thrombocytopenia. -Patient is also on loxapine 20 mg daily.  Since she is on 10 mg daily SNF.  Psychiatry to review.  History of DVT -Continue Eliquis.  Acute urinary retention: Continue indwelling Foley catheter -Continue p.o. bethanechol -Renal ultrasound unremarkable.  Needs to mobilize in order to have Foley removed but she is not moving much.  Starting on Flomax today.  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  Also on psych medications which could potentially contribute.  Recent Labs  Lab 07/08/20 0302 07/09/20 0104 07/10/20 0531 07/11/20 1312 07/12/20 0124 07/13/20 0345  PLT 111* 96* 87* 93* 91* 88*  -Normal platelet bicitrate.  Platelets stable.  Hematology signed off.  May follow-up as outpatient with hematology if needed   Morbid obesity Body mass index is 38.48 kg/m. Nutrition Problem: Increased nutrient needs Etiology: acute illness Signs/Symptoms: estimated needs Interventions: Refer to RD note for recommendations   DVT prophylaxis:   apixaban (ELIQUIS) tablet 5 mg  Code Status: DNR/DNI Family Communication: None at bedside.  Had extended discussion with the daughter twice today. Level of care: Telemetry  Medical Status is: Inpatient  Remains inpatient appropriate because:Ongoing diagnostic testing needed not appropriate for outpatient work up and Unsafe d/c plan   Dispo: The patient is from: SNF, insurance declined SNF.  Did peer to peer with insurance company today.              Anticipated d/c is to: SNF vs home with home health              Anticipated d/c date is: 1 to 2 days              Patient currently is  medically stable to d/c.   Difficult to place patient No       Consultants:  Psychiatry Hematology-following   Sch Meds:  Scheduled Meds: . apixaban  5 mg Oral BID  . atorvastatin  20 mg Oral QHS  . bethanechol  25 mg Oral TID  . Chlorhexidine Gluconate Cloth  6 each Topical Daily  . cloNIDine  0.2 mg Oral BID  . diltiazem  180 mg Oral Daily  . divalproex  500 mg Oral Q12H  . fluticasone furoate-vilanterol  1 puff Inhalation Daily  . insulin aspart  0-20 Units Subcutaneous TID WC  . insulin aspart  0-5 Units Subcutaneous QHS  . insulin detemir  25 Units Subcutaneous Daily  . loxapine  20 mg Oral QHS  . melatonin  5 mg Oral QHS  . pantoprazole  40 mg Oral Daily  . polyvinyl alcohol  1 drop Both Eyes Q breakfast  . tamsulosin  0.4 mg Oral QPC breakfast   Continuous  Infusions: PRN Meds:.acetaminophen, albuterol, clonazepam, diltiazem, polyethylene glycol, Resource ThickenUp Clear, senna-docusate, traMADol  Antimicrobials: Anti-infectives (From admission, onward)   Start     Dose/Rate Route Frequency Ordered Stop   07/03/20 1400  cephALEXin (KEFLEX) capsule 500 mg        500 mg Oral Every 8 hours 07/03/20 0954 07/07/20 1146   06/30/20 2200  doxycycline (VIBRA-TABS) tablet 100 mg        100 mg Oral Every 12 hours 06/30/20 1612 07/05/20 2159   06/30/20 2030  cefTRIAXone (ROCEPHIN) 2 g in sodium chloride 0.9 % 100 mL IVPB  Status:  Discontinued        2 g 200 mL/hr over 30 Minutes Intravenous Daily at bedtime 06/30/20 2006 07/03/20 0954   06/30/20 1800   Ampicillin-Sulbactam (UNASYN) 3 g in sodium chloride 0.9 % 100 mL IVPB  Status:  Discontinued        3 g 200 mL/hr over 30 Minutes Intravenous Every 6 hours 06/30/20 1703 06/30/20 1954       I have personally reviewed the following labs and images: CBC: Recent Labs  Lab 07/09/20 0104 07/10/20 0531 07/11/20 1312 07/12/20 0124 07/13/20 0345  WBC 4.2 5.5 4.4 5.0 3.8*  NEUTROABS  --   --  3.0 3.3 2.1  HGB 11.4* 11.2* 12.0 11.3* 11.3*  HCT 37.7 37.2 37.2 36.9 36.3  MCV 99.7 98.9 97.6 98.4 96.5  PLT 96* 87* 93* 91* 88*   BMP &GFR Recent Labs  Lab 07/08/20 0302 07/09/20 0104 07/10/20 0531  NA 141 140 141  K 4.2 4.0 3.7  CL 93* 93* 92*  CO2 37* 36* 38*  GLUCOSE 204* 181* 229*  BUN 18 17 16   CREATININE <0.30* 0.32* 0.40*  CALCIUM 8.3* 8.3* 8.5*  MG  --  1.7 1.8  PHOS  --  2.9 3.9   Estimated Creatinine Clearance: 82.1 mL/min (A) (by C-G formula based on SCr of 0.4 mg/dL (L)). Liver & Pancreas: Recent Labs  Lab 07/08/20 0302 07/09/20 0104 07/10/20 0531  AST 20  --   --   ALT 22  --   --   ALKPHOS 47  --   --   BILITOT 0.8  --   --   PROT 4.9*  --   --   ALBUMIN 2.4* 2.3* 2.5*   No results for input(s): LIPASE, AMYLASE in the last 168 hours. Recent Labs  Lab 07/09/20 0104  AMMONIA 34   Diabetic: No results for input(s): HGBA1C in the last 72 hours. Recent Labs  Lab 07/12/20 1224 07/12/20 1615 07/12/20 2004 07/13/20 0615 07/13/20 1113  GLUCAP 122* 213* 214* 138* 160*   Cardiac Enzymes: No results for input(s): CKTOTAL, CKMB, CKMBINDEX, TROPONINI in the last 168 hours. No results for input(s): PROBNP in the last 8760 hours. Coagulation Profile: No results for input(s): INR, PROTIME in the last 168 hours. Thyroid Function Tests: No results for input(s): TSH, T4TOTAL, FREET4, T3FREE, THYROIDAB in the last 72 hours. Lipid Profile: No results for input(s): CHOL, HDL, LDLCALC, TRIG, CHOLHDL, LDLDIRECT in the last 72 hours. Anemia Panel: No results for  input(s): VITAMINB12, FOLATE, FERRITIN, TIBC, IRON, RETICCTPCT in the last 72 hours. Urine analysis:    Component Value Date/Time   COLORURINE YELLOW 06/30/2020 1211   APPEARANCEUR TURBID (A) 06/30/2020 1211   LABSPEC 1.023 06/30/2020 1211   PHURINE 5.0 06/30/2020 1211   GLUCOSEU NEGATIVE 06/30/2020 1211   HGBUR NEGATIVE 06/30/2020 1211   BILIRUBINUR NEGATIVE 06/30/2020 1211   KETONESUR 5 (  A) 06/30/2020 1211   PROTEINUR 100 (A) 06/30/2020 1211   NITRITE POSITIVE (A) 06/30/2020 1211   LEUKOCYTESUR LARGE (A) 06/30/2020 1211   Sepsis Labs: Invalid input(s): PROCALCITONIN, LACTICIDVEN  Microbiology: Recent Results (from the past 240 hour(s))  SARS CORONAVIRUS 2 (TAT 6-24 HRS) Nasopharyngeal Nasopharyngeal Swab     Status: None   Collection Time: 07/09/20  3:06 PM   Specimen: Nasopharyngeal Swab  Result Value Ref Range Status   SARS Coronavirus 2 NEGATIVE NEGATIVE Final    Comment: (NOTE) SARS-CoV-2 target nucleic acids are NOT DETECTED.  The SARS-CoV-2 RNA is generally detectable in upper and lower respiratory specimens during the acute phase of infection. Negative results do not preclude SARS-CoV-2 infection, do not rule out co-infections with other pathogens, and should not be used as the sole basis for treatment or other patient management decisions. Negative results must be combined with clinical observations, patient history, and epidemiological information. The expected result is Negative.  Fact Sheet for Patients: HairSlick.no  Fact Sheet for Healthcare Providers: quierodirigir.com  This test is not yet approved or cleared by the [place] FDA and  has been authorized for detection and/or diagnosis of SARS-CoV-2 by FDA under an Emergency Use Authorization (EUA). This EUA will remain  in effect (meaning this test can be used) for the duration of the COVID-19 declaration under Se ction 564(b)(1) of the Act, 21  U.S.C. section 360bbb-3(b)(1), unless the authorization is terminated or revoked sooner.  Performed at [facility], [address] [place], [place] [address]     Radiology Studies: No results found.   Hughie Closs, MD [facility]  If 7PM-7AM, please contact night-coverage [email or web address] 07/13/2020, 1:23 PM

## 2020-07-13 NOTE — TOC Progression Note (Addendum)
Transition of Care [facility]) - Progression Note    Patient Details  Name: Murna Backer MRN: [id number] Date of Birth: 08-06-1955  Transition of Care [facility]) CM/SW Contact  [name], LCSW Phone Number: 07/13/2020, 9:24 AM  Clinical Narrative:  CSW received call from [employer] stating SNF auth request has been referred for peer to peer review.  Must be completed by 130pm today by calling [employer] at [phone] option 5.   1000: MD completed peer to peer, [employer] requesting new note from PT by 1230.  CSW called PT office and they will assign someone.   1200: PT note faxed to [facility].  1315: phone call from [facility] at [place].  SNF auth denied.  She did receive PT note.  Pt/family can appeal by calling [phone].  1330: CSW spoke with daughter Joyce Gross, informed her of SNF denial, gave her option to appeal.  Joyce Gross would like to focus on how to bring the pt back home.  Melissa Swink, CAP case mgr is trying to pursue [facility] aide services: [phone] or cell [phone].  Joyce Gross does need hospital bed and lift--she has a specific bed that she has looked at before and will provide contact number regarding this.  She does want to pursue HH.    1345: TC with Melissa Swink.  She will work on locating [facility] aide.  1350: TC [facility]  [phone].  They declined HH referral.   1450: CSW spoke with Efraim Kaufmann Swink who will see if there is HH aide available to assist daughter at home. Discussed potential DC tomorrow.      Expected Discharge Plan: Skilled Nursing Facility Barriers to Discharge: Continued Medical Work up  Expected Discharge Plan and Services Expected Discharge Plan: Skilled Nursing Facility     Post Acute Care Choice:  (daughter requesting LTAC) Living arrangements for the past 2 months: Skilled Nursing Facility (was hospitalized at [place] for 30+ days prior to SNF)                                       Social Determinants of Health (SDOH) Interventions     Readmission Risk Interventions No flowsheet data found.

## 2020-07-13 NOTE — Progress Notes (Signed)
18 beats SVT. Assessed patient at bedside, patient asymptomatic. Vitals stable. Pahwani secure message  paged via amion.

## 2020-07-13 NOTE — Progress Notes (Signed)
Bipap not needed at this time. RT will continue to monitor as needed. °

## 2020-07-13 NOTE — Progress Notes (Signed)
Nutrition Follow Up  DOCUMENTATION CODES:   Not applicable  INTERVENTION:   Needs feeding assistance with each meal   Magic cup TID with meals, each supplement provides 290 kcal and 9 grams of protein  MVI daily   NUTRITION DIAGNOSIS:   Increased nutrient needs related to acute illness as evidenced by estimated needs.  Ongoing  GOAL:   Patient will meet greater than or equal to 90% of their needs   Progressing   MONITOR:   PO intake,Diet advancement,Supplement acceptance,Labs,Weight trends,I & O's  REASON FOR ASSESSMENT:   Consult Assessment of nutrition requirement/status  ASSESSMENT:   Patient with PMH significant for chronic hypoxic and hypercapnic respiratory failure 2/2 to COPD, HTN, recent diagnosis of DVT, DM, dementia, and bipolar disorder. Presents this admission with COPD exacerbation.  2/17- diet advanced to DYS2, honey thick liquids  Patient unable to provide information upon follow up. Per nurse tech patient eating okay at meals. Last four meal completions charted as 100%, 25%, 50%, and 75%. Continue supplements.   Plan d/c to SNF.   Admission weight: 104.9 kg   Medications: SS novolog, levemir Labs: CBG 114-205   Diet Order:   Diet Order            DIET DYS 2 Room service appropriate? No; Fluid consistency: Honey Thick  Diet effective now                 EDUCATION NEEDS:   Not appropriate for education at this time  Skin:  Skin Assessment: Reviewed RN Assessment  Last BM:  2/27  Height:   Ht Readings from Last 1 Encounters:  07/07/20 5\' 5"  (1.651 m)    Weight:   Wt Readings from Last 1 Encounters:  07/07/20 104.9 kg    BMI:  Body mass index is 38.48 kg/m.  Estimated Nutritional Needs:   Kcal:  1800-2000 kcal  Protein:  90-105 grams  Fluid:  >/= 1.8 L/day  07/09/20 RD, LDN Clinical Nutrition Pager listed in AMION

## 2020-07-13 NOTE — Progress Notes (Signed)
Physical Therapy Treatment Patient Details Name: Stephanie Rice MRN: [id number] DOB: 04-Sep-1955 Today's Date: 07/13/2020    History of Present Illness Stephanie Rice is a 65 y.o. female with medical history significant of chronic hypoxic and hypercapnic respiratory failure secondary COPD, on Trelegy PRN +2 L PRN, HTN, recent diagnosed DVT, morbid obesity, IDDM, Dementia, bipolar disorder, presented with altered mentation.    PT Comments    Pt progressing towards goals. Was agreeable to bed mobility this session, so PT could wash back, arms and legs. Required max A for bed mobility. Min to mod A for sitting balance this session. Poor sitting balance with dynamic tasks. Pt quickly falling asleep at end of session. Current recommendations appropriate. Will continue to follow acutely.     Follow Up Recommendations  SNF;Supervision/Assistance - 24 hour     Equipment Recommendations  Wheelchair (measurements PT);Wheelchair cushion (measurements PT);Hospital bed;Other (comment) (hoyer with hoyer pad)    Recommendations for Other Services       Precautions / Restrictions Precautions Precautions: Fall;Other (comment) Precaution Comments: monitor O2, delirium Restrictions Weight Bearing Restrictions: No    Mobility  Bed Mobility Overal bed mobility: Needs Assistance Bed Mobility: Supine to Sit;Sit to Supine     Supine to sit: Max assist;HOB elevated Sit to supine: Mod assist   General bed mobility comments: Required assist for BLE and trunk to come to sitting. Multimodal cues for sequencing. Requiring at least min A to sit at EOB. Was agreeable to PT washing back and under arms in sitting. Poor dynamic balance in sitting.    Transfers                 General transfer comment: unable to progress beyond EOB  Ambulation/Gait                 Stairs             Wheelchair Mobility    Modified Rankin (Stroke Patients Only)       Balance Overall balance  assessment: Needs assistance Sitting-balance support: No upper extremity supported;Feet supported Sitting balance-Leahy Scale: Poor Sitting balance - Comments: Reliant on at least min up to mod A for sitting balance. Poor dynamic balance when lifting arms.                                    Cognition Arousal/Alertness: Awake/alert Behavior During Therapy: Flat affect Overall Cognitive Status: No family/caregiver present to determine baseline cognitive functioning Area of Impairment: Orientation;Attention;Following commands;Safety/judgement;Awareness;Problem solving;Memory                 Orientation Level: Disoriented to;Place;Time;Situation Current Attention Level: Focused Memory: Decreased short-term memory Following Commands: Follows one step commands inconsistently Safety/Judgement: Decreased awareness of safety;Decreased awareness of deficits Awareness: Intellectual Problem Solving: Slow processing;Difficulty sequencing;Requires verbal cues;Requires tactile cues General Comments: Inconsistent following of commands, poor safety awareness. Can get agitated quickly      Exercises      General Comments General comments (skin integrity, edema, etc.): Pt quickly falling asleep after returning to supine. VSS throughout.      Pertinent Vitals/Pain Pain Assessment: Faces Faces Pain Scale: Hurts little more Pain Location: BLE with mobility Pain Descriptors / Indicators: Grimacing;Guarding;Discomfort Pain Intervention(s): Limited activity within patient's tolerance;Monitored during session;Repositioned    Home Living                      Prior Function  PT Goals (current goals can now be found in the care plan section) Acute Rehab PT Goals Patient Stated Goal: unable to state PT Goal Formulation: Patient unable to participate in goal setting Time For Goal Achievement: 07/15/20 Progress towards PT goals: Progressing toward goals     Frequency    Min 2X/week      PT Plan Equipment recommendations need to be updated    Co-evaluation              AM-PAC PT "6 Clicks" Mobility   Outcome Measure  Help needed turning from your back to your side while in a flat bed without using bedrails?: A Lot Help needed moving from lying on your back to sitting on the side of a flat bed without using bedrails?: A Lot Help needed moving to and from a bed to a chair (including a wheelchair)?: Total Help needed standing up from a chair using your arms (e.g., wheelchair or bedside chair)?: Total Help needed to walk in hospital room?: Total Help needed climbing 3-5 steps with a railing? : Total 6 Click Score: 8    End of Session Equipment Utilized During Treatment: Oxygen Activity Tolerance: Other (comment) (limited by cognition.) Patient left: in bed;with call bell/phone within reach;with bed alarm set Nurse Communication: Mobility status PT Visit Diagnosis: Other abnormalities of gait and mobility (R26.89)     Time: 2297-9892 PT Time Calculation (min) (ACUTE ONLY): 16 min  Charges:  $Therapeutic Activity: 8-22 mins                     Cindee Salt, DPT  Acute Rehabilitation Services  Pager: [phone] Office: [phone]    Lehman Prom 07/13/2020, 11:20 AM

## 2020-07-14 DIAGNOSIS — D696 Thrombocytopenia, unspecified: Secondary | ICD-10-CM | POA: Diagnosis not present

## 2020-07-14 DIAGNOSIS — J441 Chronic obstructive pulmonary disease with (acute) exacerbation: Secondary | ICD-10-CM | POA: Diagnosis not present

## 2020-07-14 LAB — BASIC METABOLIC PANEL
Anion gap: 10 (ref 5–15)
BUN: 26 mg/dL — ABNORMAL HIGH (ref 8–23)
CO2: 39 mmol/L — ABNORMAL HIGH (ref 22–32)
Calcium: 8.4 mg/dL — ABNORMAL LOW (ref 8.9–10.3)
Chloride: 91 mmol/L — ABNORMAL LOW (ref 98–111)
Creatinine, Ser: 0.48 mg/dL (ref 0.44–1.00)
GFR, Estimated: >60 mL/min (ref >60)
Glucose, Bld: 172 mg/dL — ABNORMAL HIGH (ref 70–99)
Potassium: 3.9 mmol/L (ref 3.5–5.1)
Sodium: 140 mmol/L (ref 135–145)

## 2020-07-14 LAB — CBC WITH DIFFERENTIAL/PLATELET
Abs Immature Granulocytes: 0.04 10*3/uL (ref 0.00–0.07)
Basophils Absolute: 0 10*3/uL (ref 0.0–0.1)
Basophils Relative: 0 %
Eosinophils Absolute: 0 10*3/uL (ref 0.0–0.5)
Eosinophils Relative: 1 %
HCT: 35.6 % — ABNORMAL LOW (ref 36.0–46.0)
Hemoglobin: 10.8 g/dL — ABNORMAL LOW (ref 12.0–15.0)
Immature Granulocytes: 1 %
Lymphocytes Relative: 25 %
Lymphs Abs: 1 10*3/uL (ref 0.7–4.0)
MCH: 30.2 pg (ref 26.0–34.0)
MCHC: 30.3 g/dL (ref 30.0–36.0)
MCV: 99.4 fL (ref 80.0–100.0)
Monocytes Absolute: 0.6 10*3/uL (ref 0.1–1.0)
Monocytes Relative: 14 %
Neutro Abs: 2.3 10*3/uL (ref 1.7–7.7)
Neutrophils Relative %: 59 %
Platelets: 77 10*3/uL — ABNORMAL LOW (ref 150–400)
RBC: 3.58 MIL/uL — ABNORMAL LOW (ref 3.87–5.11)
RDW: 14.4 % (ref 11.5–15.5)
WBC: 3.9 10*3/uL — ABNORMAL LOW (ref 4.0–10.5)
nRBC: 0 % (ref 0.0–0.2)

## 2020-07-14 LAB — GLUCOSE, CAPILLARY
Glucose-Capillary: 134 mg/dL — ABNORMAL HIGH (ref 70–99)
Glucose-Capillary: 202 mg/dL — ABNORMAL HIGH (ref 70–99)
Glucose-Capillary: 182 mg/dL — ABNORMAL HIGH (ref 70–99)

## 2020-07-14 LAB — VALPROIC ACID LEVEL
Valproic Acid Lvl: 67 ug/mL (ref 50.0–100.0)
Valproic Acid Lvl: 58 ug/mL (ref 50.0–100.0)

## 2020-07-14 LAB — PLATELET BY CITRATE

## 2020-07-14 MED ORDER — LORAZEPAM 2 MG/ML IJ SOLN
1.0000 mg | Freq: Once | INTRAMUSCULAR | Status: DC
  Filled 2020-07-14: qty 1

## 2020-07-14 NOTE — Progress Notes (Signed)
PROGRESS NOTE  Stephanie Rice WUJ:[id number] DOB: [birth year]   PCP: Coralee Ruduran, Michael R, PA-C  Patient is from: SNF  DOA: 06/30/2020 LOS: 14  Chief complaints: Altered mental status  Brief Narrative / Interim history: 65 year old F with PMH of dementia, intellectual disability, COPD/chronic RF on trilogy vent and 2 L oxygen as needed, recent DVT, morbid obesity, IDDM-2, bipolar disorder and HTN brought to ED from SNF with altered mental status and admitted for acute metabolic encephalopathy in the setting of hypercapnia from acute on chronic respiratory failure and COPD exacerbation, and urinary tract infection.  Treated with steroid, antibiotics and BiPAP with improvement in ABG but waxing and waning mental status and some concern about behavioral issue.  Psychiatry consulted and gave recommendation.  Hospital course also complicated by acute thrombocytopenia and acute urinary retention.  Hematology consulted for thrombocytopenia.  Psychiatry also adjusted psych medications which could potentially contribute to thrombocytopenia.  She has indwelling Foley catheter for acute urinary retention   Subjective: Patient seen and examined.  Patient sleepy today.  Not able to talk but she is able to answer the questions by nodding her head.  Denies any complaints.  Looks comfortable.  Objective: Vitals:   07/13/20 1200 07/13/20 1244 07/13/20 2015 07/14/20 0730  BP: (!) 124/52 (!) 111/52 132/66 (!) 138/58  Pulse: 78 73 78 72  Resp: 17 20 19 17   Temp:   98.1 F (36.7 C) 98.2 F (36.8 C)  TempSrc:   Oral Axillary  SpO2: 100% 100% 95% 100%  Weight:      Height:        Intake/Output Summary (Last 24 hours) at 07/14/2020 1346 Last data filed at 07/13/2020 2100 Gross per 24 hour  Intake 240 ml  Output --  Net 240 ml   Filed Weights   07/07/20 1731  Weight: 104.9 kg    Examination:  General exam: Appears calm and comfortable, morbidly obese and sleepy Respiratory system: Diminished breath  sounds. Respiratory effort normal. Cardiovascular system: S1 & S2 heard, RRR. No JVD, murmurs, rubs, gallops or clicks. No pedal edema. Gastrointestinal system: Abdomen is nondistended, soft and nontender. No organomegaly or masses felt. Normal bowel sounds heard. Central nervous system: Lethargic and not oriented.  No focal deficit. Extremities: Symmetric 5 x 5 power. Skin: No rashes, lesions or ulcers.  Psychiatry: Judgement and insight appear poor Procedures:  None  Microbiology summarized: COVID-19 and influenza PCR nonreactive.  Assessment & Plan: Acute metabolic encephalopathy/delirium-multifactorial including hypercapnia, UTI, CAP, underlying dementia, bipolar disorder and intellectual disability.  Reportedly waxing or waning suggesting delirium.  She was also on steroids for COPD exacerbation which could have contributed.   CT head, RPR, B12 and TSH without acute finding. Per daughter, she always say no to any question.  She is sleepy but arousable. -Treat treatable causes -Reorientation and delirium precautions -Minimize sedating meds. -Minimal oxygen to keep sat above 88%. -Psychiatry decreased Depakote to 500 mg twice daily in the setting of acute thrombocytopenia.  Patient often remains very sleepy.  Although CT head has been done 2 weeks ago which was unremarkable but I will proceed with MRI brain to rule out a stroke or other unknown structural pathology.  I am also concerned if her Depakote dose is high for her.  Will check Depakote level.  Acute on chronic hypoxic and hypercapnic respiratory failure due to COPD exacerbation and CAP -Patient is on trilogy vent and oxygen 2 L as needed at baseline -ABG improved with BiPAP.  Minimal oxygen to keep  saturation above 88%.  -Completed 5 days of doxycycline and 3 days of ceftriaxone -Completed course of systemic steroid -Apparently, SNF does not take patients with trilogy vent.  So BiPAP on discharge  Atrial fibrillation with  RVR: RVR resolved.  TTE without significant finding. -Changed metoprolol to Cardizem given underlying COPD -Continue Eliquis-was on this for DVT -Discontinued as needed hydralazine  Dysphagia: -Dysphagia 2 diet per SLP. -Continue aspiration precautions  UTI-completed antibiotic course with ceftriaxone and Keflex  Oropharyngeal dysphagia -SLP recommended dysphagia 2 diet  Mild hyperkalemia -Corrected with Lokelma.    Uncontrolled hypertension: Improved. -Continue clonidine 0.2 mg twice daily -Continue Cardizem CD 180 mg daily -Discontinued as needed hydralazine  Uncontrolled IDDM-2 with hyperglycemia and neuropathy: A1c 7.2%. Recent Labs  Lab 07/12/20 2004 07/13/20 0615 07/13/20 1113 07/13/20 1945 07/14/20 0631  GLUCAP 214* 138* 160* 214* 134*  -Continue current insulin regimen  History of bipolar disorder -Psychiatry decreased Depakote to 500 mg twice daily (home dose) in the setting of thrombocytopenia. -Patient is also on loxapine 20 mg daily.  Since she is on 10 mg daily SNF.   History of DVT -Continue Eliquis.  Acute urinary retention: Continue indwelling Foley catheter -Continue p.o. bethanechol -Renal ultrasound unremarkable.  Needs to mobilize in order to have Foley removed but she is not moving much.  Starting on Flomax today.  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  Also on psych medications which could potentially contribute.  Recent Labs  Lab 07/08/20 0302 07/09/20 0104 07/10/20 0531 07/11/20 1312 07/12/20 0124 07/13/20 0345 07/14/20 0551  PLT 111* 96* 87* 93* 91* 88* 77*  -Normal platelet bicitrate.  Platelets stable.  Hematology signed off.  May follow-up as outpatient with hematology if needed   Morbid obesity Body mass index is 38.48 kg/m. Nutrition Problem: Increased nutrient needs Etiology: acute illness Signs/Symptoms: estimated needs Interventions: Refer  to RD note for recommendations   DVT prophylaxis:   apixaban (ELIQUIS) tablet 5 mg  Code Status: DNR/DNI Family Communication: None at bedside.  Level of care: Telemetry Medical Status is: Inpatient  Remains inpatient appropriate because:Ongoing diagnostic testing needed not appropriate for outpatient work up and Unsafe d/c plan   Dispo: The patient is from: SNF, insurance declined SNF.  Did peer to peer with insurance company 07/13/2020.  Daughter appealing SNF declination.              Anticipated d/c is to: SNF vs home with home health              Anticipated d/c date is: 1 to 2 days              Patient currently is  medically stable to d/c.   Difficult to place patient No       Consultants:  Psychiatry Hematology-following   Sch Meds:  Scheduled Meds: . apixaban  5 mg Oral BID  . atorvastatin  20 mg Oral QHS  . bethanechol  25 mg Oral TID  . Chlorhexidine Gluconate Cloth  6 each Topical Daily  . cloNIDine  0.2 mg Oral BID  . diltiazem  180 mg Oral Daily  . divalproex  500 mg Oral Q12H  . fluticasone furoate-vilanterol  1 puff Inhalation Daily  . insulin aspart  0-20 Units Subcutaneous TID WC  . insulin aspart  0-5 Units Subcutaneous QHS  . insulin detemir  25 Units Subcutaneous Daily  . loxapine  20 mg Oral QHS  . melatonin  5  mg Oral QHS  . pantoprazole  40 mg Oral Daily  . polyvinyl alcohol  1 drop Both Eyes Q breakfast  . tamsulosin  0.4 mg Oral QPC breakfast   Continuous Infusions: PRN Meds:.acetaminophen, albuterol, clonazepam, diltiazem, polyethylene glycol, Resource ThickenUp Clear, senna-docusate, traMADol  Antimicrobials: Anti-infectives (From admission, onward)   Start     Dose/Rate Route Frequency Ordered Stop   07/03/20 1400  cephALEXin (KEFLEX) capsule 500 mg        500 mg Oral Every 8 hours 07/03/20 0954 07/07/20 1146   06/30/20 2200  doxycycline (VIBRA-TABS) tablet 100 mg        100 mg Oral Every 12 hours 06/30/20 1612 07/05/20 2159    06/30/20 2030  cefTRIAXone (ROCEPHIN) 2 g in sodium chloride 0.9 % 100 mL IVPB  Status:  Discontinued        2 g 200 mL/hr over 30 Minutes Intravenous Daily at bedtime 06/30/20 2006 07/03/20 0954   06/30/20 1800  Ampicillin-Sulbactam (UNASYN) 3 g in sodium chloride 0.9 % 100 mL IVPB  Status:  Discontinued        3 g 200 mL/hr over 30 Minutes Intravenous Every 6 hours 06/30/20 1703 06/30/20 1954       I have personally reviewed the following labs and images: CBC: Recent Labs  Lab 07/10/20 0531 07/11/20 1312 07/12/20 0124 07/13/20 0345 07/14/20 0551  WBC 5.5 4.4 5.0 3.8* 3.9*  NEUTROABS  --  3.0 3.3 2.1 2.3  HGB 11.2* 12.0 11.3* 11.3* 10.8*  HCT 37.2 37.2 36.9 36.3 35.6*  MCV 98.9 97.6 98.4 96.5 99.4  PLT 87* 93* 91* 88* 77*   BMP &GFR Recent Labs  Lab 07/08/20 0302 07/09/20 0104 07/10/20 0531 07/14/20 0551  NA 141 140 141 140  K 4.2 4.0 3.7 3.9  CL 93* 93* 92* 91*  CO2 37* 36* 38* 39*  GLUCOSE 204* 181* 229* 172*  BUN 18 17 16  26*  CREATININE <0.30* 0.32* 0.40* 0.48  CALCIUM 8.3* 8.3* 8.5* 8.4*  MG  --  1.7 1.8  --   PHOS  --  2.9 3.9  --    Estimated Creatinine Clearance: 82.1 mL/min (by C-G formula based on SCr of 0.48 mg/dL). Liver & Pancreas: Recent Labs  Lab 07/08/20 0302 07/09/20 0104 07/10/20 0531  AST 20  --   --   ALT 22  --   --   ALKPHOS 47  --   --   BILITOT 0.8  --   --   PROT 4.9*  --   --   ALBUMIN 2.4* 2.3* 2.5*   No results for input(s): LIPASE, AMYLASE in the last 168 hours. Recent Labs  Lab 07/09/20 0104  AMMONIA 34   Diabetic: No results for input(s): HGBA1C in the last 72 hours. Recent Labs  Lab 07/12/20 2004 07/13/20 0615 07/13/20 1113 07/13/20 1945 07/14/20 0631  GLUCAP 214* 138* 160* 214* 134*   Cardiac Enzymes: No results for input(s): CKTOTAL, CKMB, CKMBINDEX, TROPONINI in the last 168 hours. No results for input(s): PROBNP in the last 8760 hours. Coagulation Profile: No results for input(s): INR, PROTIME in the  last 168 hours. Thyroid Function Tests: No results for input(s): TSH, T4TOTAL, FREET4, T3FREE, THYROIDAB in the last 72 hours. Lipid Profile: No results for input(s): CHOL, HDL, LDLCALC, TRIG, CHOLHDL, LDLDIRECT in the last 72 hours. Anemia Panel: No results for input(s): VITAMINB12, FOLATE, FERRITIN, TIBC, IRON, RETICCTPCT in the last 72 hours. Urine analysis:    Component Value Date/Time  COLORURINE YELLOW 06/30/2020 1211   APPEARANCEUR TURBID (A) 06/30/2020 1211   LABSPEC 1.023 06/30/2020 1211   PHURINE 5.0 06/30/2020 1211   GLUCOSEU NEGATIVE 06/30/2020 1211   HGBUR NEGATIVE 06/30/2020 1211   BILIRUBINUR NEGATIVE 06/30/2020 1211   KETONESUR 5 (A) 06/30/2020 1211   PROTEINUR 100 (A) 06/30/2020 1211   NITRITE POSITIVE (A) 06/30/2020 1211   LEUKOCYTESUR LARGE (A) 06/30/2020 1211   Sepsis Labs: Invalid input(s): PROCALCITONIN, LACTICIDVEN  Microbiology: Recent Results (from the past 240 hour(s))  SARS CORONAVIRUS 2 (TAT 6-24 HRS) Nasopharyngeal Nasopharyngeal Swab     Status: None   Collection Time: 07/09/20  3:06 PM   Specimen: Nasopharyngeal Swab  Result Value Ref Range Status   SARS Coronavirus 2 NEGATIVE NEGATIVE Final    Comment: (NOTE) SARS-CoV-2 target nucleic acids are NOT DETECTED.  The SARS-CoV-2 RNA is generally detectable in upper and lower respiratory specimens during the acute phase of infection. Negative results do not preclude SARS-CoV-2 infection, do not rule out co-infections with other pathogens, and should not be used as the sole basis for treatment or other patient management decisions. Negative results must be combined with clinical observations, patient history, and epidemiological information. The expected result is Negative.  Fact Sheet for Patients: HairSlick.no  Fact Sheet for Healthcare Providers: quierodirigir.com  This test is not yet approved or cleared by the [place] FDA and   has been authorized for detection and/or diagnosis of SARS-CoV-2 by FDA under an Emergency Use Authorization (EUA). This EUA will remain  in effect (meaning this test can be used) for the duration of the COVID-19 declaration under Se ction 564(b)(1) of the Act, 21 U.S.C. section 360bbb-3(b)(1), unless the authorization is terminated or revoked sooner.  Performed at [facility], [address] [place], [place] [address]   Urine Culture     Status: Abnormal (Preliminary result)   Collection Time: 07/11/20  4:14 PM   Specimen: Urine, Catheterized  Result Value Ref Range Status   Specimen Description URINE, CATHETERIZED  Final   Special Requests NONE  Final   Culture (A)  Final    >=100,000 COLONIES/mL ENTEROCOCCUS FAECALIS SUSCEPTIBILITIES TO FOLLOW Performed at [facility], [address], [place], [place] [address]    Report Status PENDING  Incomplete    Radiology Studies: No results found.   Hughie Closs, MD [facility]  If 7PM-7AM, please contact night-coverage [email or web address] 07/14/2020, 1:46 PM

## 2020-07-14 NOTE — TOC Progression Note (Addendum)
Transition of Care [facility]) - Progression Note    Patient Details  Name: Stephanie Rice MRN: [id number] Date of Birth: 1955/09/07  [facility]) CM/SW Contact  [name], LCSW Phone Number: 07/14/2020, 9:53 AM  Clinical Narrative:   CSW attempted to contact daughter Joyce Gross by phone and text.  No response yet.  CSW spoke with Darlyn Read, CAP services.  HH aide will require a new evaluation, which she will initiate.  She is also looking at a possible home RN through medicaid to assist with the trilogy.   1015: CSW spoke with [facility].  Pt received a high/low bed 12 months ago and insurance will not pay for another.  Insurance also only covers one bed and will not pay for an upgraded bed.  1040: [facility] declines referral for HH.  CSW has made several more attempts to reach daughter, who is not responding.  1055: DME orders faxed to [facility], [phone].  Fax: [phone]  1100: CSW spoke with daughter Joyce Gross who reports that she has decided to appeal the SNF denial.  Appeal phone number again provided. Discussed progress on setting up [facility] and DME.  Discussed that CSW has been told that insurance will not pay for another bed (she received one 12 months ago) and insurance will not pay for an upgraded bed. Joyce Gross reports she does not need a wheelchair, she does need the hoyer lift.  Joyce Gross also again requesting prescription for pureed food and CSW told her that we would need something more specific--she said she will contact the insurance navigator who can assist.  Joyce Gross agreed to call back with next steps after speaking to [place] about SNF auth appeal.    1300: [facility] Keen accepts [facility] referral for [facility].          Expected Discharge Plan: Skilled Nursing Facility Barriers to Discharge: Continued Medical Work up  Expected Discharge Plan and Services Expected Discharge Plan: Skilled Nursing Facility     Post Acute Care Choice:  (daughter requesting LTAC) Living arrangements  for the past 2 months: Skilled Nursing Facility (was hospitalized at [facility] for 30+ days prior to SNF)                                       Social Determinants of Health (SDOH) Interventions    Readmission Risk Interventions No flowsheet data found.

## 2020-07-14 NOTE — Progress Notes (Signed)
Occupational Therapy Treatment Patient Details Name: Stephanie Rice MRN: [id number] DOB: [birth year] Today's Date: 07/14/2020    History of present illness Karalina Tift is a 65 y.o. female with medical history significant of chronic hypoxic and hypercapnic respiratory failure secondary COPD, on Trelegy PRN +2 L PRN, HTN, recent diagnosed DVT, morbid obesity, IDDM, Dementia, bipolar disorder, presented with altered mentation.   OT comments  Pt pleasant this afternoon and noted with improved command following today though still A&OX1. Pt agreeable to sit EOB, heavy Max A for trunk/LE advancement. Pt only sat EOB for 10 seconds before spontaneously returning self to supine. Pt asking for water, setup for part of lunch with Min A at most for self feeding upright in bed. With repositioning in bed, decreased spillage and improved independence noted with self feeding though does require consistent cues for slow pacing and small bites. Renewed OT goals for an additional week to assess for potential continued progress. Will monitor and update OT POC as appropriate.     Follow Up Recommendations  SNF;Supervision/Assistance - 24 hour    Equipment Recommendations  Wheelchair (measurements OT);Wheelchair cushion (measurements OT);Hospital bed    Recommendations for Other Services      Precautions / Restrictions Precautions Precautions: Fall;Other (comment) Precaution Comments: monitor O2, delirium Restrictions Weight Bearing Restrictions: No       Mobility Bed Mobility Overal bed mobility: Needs Assistance Bed Mobility: Supine to Sit;Sit to Supine     Supine to sit: Max assist;HOB elevated Sit to supine: Min assist   General bed mobility comments: Heavy Max A for trunk LE to EOB due to difficulty sequencing, Min A at most for spontaneous return to supine    Transfers                 General transfer comment: unable to progress beyond EOB    Balance Overall balance assessment:  Needs assistance Sitting-balance support: No upper extremity supported;Feet supported Sitting balance-Leahy Scale: Poor Sitting balance - Comments: reliant on at least Min A for sitting EOB                                   ADL either performed or assessed with clinical judgement   ADL Overall ADL's : Needs assistance/impaired Eating/Feeding: Minimal assistance;Bed level Eating/Feeding Details (indicate cue type and reason): Min A at most for self feeding, some spillage that improved with repositioning efforts. Overall supervision with cues needed for slow pace, small bites, etc.                                   General ADL Comments: Improved following of commands, pleasant today     Vision   Vision Assessment?: No apparent visual deficits   Perception     Praxis      Cognition Arousal/Alertness: Awake/alert Behavior During Therapy: Impulsive;Flat affect Overall Cognitive Status: No family/caregiver present to determine baseline cognitive functioning Area of Impairment: Orientation;Attention;Following commands;Safety/judgement;Awareness;Problem solving;Memory                 Orientation Level: Disoriented to;Place;Time;Situation Current Attention Level: Sustained Memory: Decreased short-term memory Following Commands: Follows one step commands inconsistently;Follows one step commands with increased time Safety/Judgement: Decreased awareness of safety;Decreased awareness of deficits Awareness: Intellectual Problem Solving: Slow processing;Difficulty sequencing;Requires verbal cues;Requires tactile cues General Comments: improved following of commands today, impulsive with bites for  food (cues to slow down and small bites). Poor awareness of deficits, though not agitated today        Exercises     Shoulder Instructions       General Comments VSS on 4 L O2    Pertinent Vitals/ Pain       Pain Assessment: Faces Faces Pain Scale: No  hurt Pain Intervention(s): Monitored during session  Home Living                                          Prior Functioning/Environment              Frequency  Min 2X/week        Progress Toward Goals  OT Goals(current goals can now be found in the care plan section)  Progress towards OT goals: Progressing toward goals  Acute Rehab OT Goals Patient Stated Goal: unable to state OT Goal Formulation: Patient unable to participate in goal setting Time For Goal Achievement: 07/21/20 Potential to Achieve Goals: Fair ADL Goals Pt Will Perform Grooming: with min assist;sitting;bed level Pt Will Perform Upper Body Bathing: with mod assist;bed level Pt Will Perform Upper Body Dressing: with mod assist;bed level Additional ADL Goal #1: Pt to demonstrate ability to follow one step commands >50% of the time Additional ADL Goal #2: Pt to be A&Ox2 with use of external cues Additional ADL Goal #3: Pt to demo bed mobility at Min A in prep for ADL transfers  Plan Discharge plan remains appropriate    Co-evaluation                 AM-PAC OT "6 Clicks" Daily Activity     Outcome Measure   Help from another person eating meals?: A Little Help from another person taking care of personal grooming?: A Lot Help from another person toileting, which includes using toliet, bedpan, or urinal?: Total Help from another person bathing (including washing, rinsing, drying)?: Total Help from another person to put on and taking off regular upper body clothing?: Total Help from another person to put on and taking off regular lower body clothing?: Total 6 Click Score: 9    End of Session Equipment Utilized During Treatment: Oxygen  OT Visit Diagnosis: Unsteadiness on feet (R26.81);Other abnormalities of gait and mobility (R26.89);Muscle weakness (generalized) (M62.81);Other symptoms and signs involving cognitive function;Other (comment)   Activity Tolerance Patient  tolerated treatment well   Patient Left in bed;with call bell/phone within reach;with bed alarm set   Nurse Communication Mobility status;Other (comment) (assist with meals)        Time: 0354-6568 OT Time Calculation (min): 31 min  Charges: OT General Charges $OT Visit: 1 Visit OT Treatments $Self Care/Home Management : 23-37 mins  Bradd Canary, OTR/L Acute Rehab Services Office: [phone]   Lorre Munroe 07/14/2020, 2:57 PM

## 2020-07-14 NOTE — TOC Progression Note (Signed)
Transition of Care [facility]) - Progression Note    Patient Details  Name: Stephanie Rice MRN: [id number] Date of Birth: July 27, 1955  [facility]) CM/SW Contact  Beckie Busing, RN Phone Number: [phone]  07/14/2020, 10:31 AM  Clinical Narrative:    Patient suffers from weakness which impairs their ability to perform daily activities like ADLs in the home.  A walking aid will not resolve  issue with performing activities of daily living. A wheelchair will allow patient to safely perform daily activities. Patient is not able to propel themselves in the home using a standard weight wheelchair due to weakness. Patient can self propel in the lightweight wheelchair. Length of need lifetime. Accessories: elevating leg rests (ELRs), wheel locks, extensions and anti-tippers.   Expected Discharge Plan: Skilled Nursing Facility Barriers to Discharge: Continued Medical Work up  Expected Discharge Plan and Services Expected Discharge Plan: Skilled Nursing Facility     Post Acute Care Choice:  (daughter requesting LTAC) Living arrangements for the past 2 months: Skilled Nursing Facility (was hospitalized at [facility] for 30+ days prior to SNF)                                       Social Determinants of Health (SDOH) Interventions    Readmission Risk Interventions No flowsheet data found.

## 2020-07-14 NOTE — Progress Notes (Signed)
Overall, the platelet count is still holding relatively steady.  It is 77,000.  Again, I have to believe this is something that will eventually improve.  Again, the medications that she is taking can certainly contribute.  However, at the current level, there should be no problems with bleeding.  I know she is on Eliquis but this works in a different part of the coagulation system.  I am not sure what the discharge plans are for her.  I do not see need for any invasive procedures as of yet.  I still think that we does have to be patient and let the platelet count eventually come up.  Christin Bach, MD  1 John 4:7

## 2020-07-15 ENCOUNTER — Inpatient Hospital Stay (HOSPITAL_COMMUNITY): Payer: Medicare Other

## 2020-07-15 DIAGNOSIS — J441 Chronic obstructive pulmonary disease with (acute) exacerbation: Secondary | ICD-10-CM | POA: Diagnosis not present

## 2020-07-15 LAB — CBC WITH DIFFERENTIAL/PLATELET
Abs Immature Granulocytes: 0.01 10*3/uL (ref 0.00–0.07)
Basophils Absolute: 0 10*3/uL (ref 0.0–0.1)
Basophils Relative: 0 %
Eosinophils Absolute: 0 10*3/uL (ref 0.0–0.5)
Eosinophils Relative: 1 %
HCT: 38.5 % (ref 36.0–46.0)
Hemoglobin: 12.2 g/dL (ref 12.0–15.0)
Immature Granulocytes: 0 %
Lymphocytes Relative: 22 %
Lymphs Abs: 0.8 10*3/uL (ref 0.7–4.0)
MCH: 31 pg (ref 26.0–34.0)
MCHC: 31.7 g/dL (ref 30.0–36.0)
MCV: 98 fL (ref 80.0–100.0)
Monocytes Absolute: 0.6 10*3/uL (ref 0.1–1.0)
Monocytes Relative: 16 %
Neutro Abs: 2.1 10*3/uL (ref 1.7–7.7)
Neutrophils Relative %: 61 %
Platelets: 75 10*3/uL — ABNORMAL LOW (ref 150–400)
RBC: 3.93 MIL/uL (ref 3.87–5.11)
RDW: 14.1 % (ref 11.5–15.5)
WBC: 3.9 10*3/uL — ABNORMAL LOW (ref 4.0–10.5)
nRBC: 0 % (ref 0.0–0.2)

## 2020-07-15 LAB — URINE CULTURE: Culture: >=100000 — AB

## 2020-07-15 LAB — GLUCOSE, CAPILLARY
Glucose-Capillary: 141 mg/dL — ABNORMAL HIGH (ref 70–99)
Glucose-Capillary: 156 mg/dL — ABNORMAL HIGH (ref 70–99)
Glucose-Capillary: 103 mg/dL — ABNORMAL HIGH (ref 70–99)
Glucose-Capillary: 145 mg/dL — ABNORMAL HIGH (ref 70–99)

## 2020-07-15 LAB — PLATELET BY CITRATE: Platelet CT in Citrate: 66

## 2020-07-15 MED ORDER — LORAZEPAM 1 MG PO TABS
1.0000 mg | ORAL_TABLET | Freq: Once | ORAL | Status: AC
  Administered 2020-07-15: 1 mg via ORAL
  Filled 2020-07-15: qty 1

## 2020-07-15 NOTE — Progress Notes (Signed)
PROGRESS NOTE  Stephanie Rice ZOX:[id number] DOB: 22-Mar-1956   PCP: [name], PA-C  Patient is from: SNF  DOA: 06/30/2020 LOS: 15  Chief complaints: Altered mental status  Brief Narrative / Interim history: 65 year old F with PMH of dementia, intellectual disability, COPD/chronic RF on trilogy vent and 2 L oxygen as needed, recent DVT, morbid obesity, IDDM-2, bipolar disorder and HTN brought to ED from SNF with altered mental status and admitted for acute metabolic encephalopathy in the setting of hypercapnia from acute on chronic respiratory failure and COPD exacerbation, and urinary tract infection.  Treated with steroid, antibiotics and BiPAP with improvement in ABG but waxing and waning mental status and some concern about behavioral issue.  Psychiatry consulted and gave recommendation.  Hospital course also complicated by acute thrombocytopenia and acute urinary retention.  Hematology consulted for thrombocytopenia.  Psychiatry also adjusted psych medications which could potentially contribute to thrombocytopenia.  She has indwelling Foley catheter for acute urinary retention   Subjective: Seen and examined.  Patient remains lethargic but easily arousable.  Denies any complaint, communicates with nodding her head only.  Seems to be disoriented.  Objective: Vitals:   07/15/20 0000 07/15/20 0400 07/15/20 0744 07/15/20 0848  BP: (!) 138/54 (!) 123/50 (!) 134/51   Pulse: 76 71 77   Resp: 13 18 18    Temp:      TempSrc:      SpO2: 100% 100% 100% 95%  Weight:      Height:        Intake/Output Summary (Last 24 hours) at 07/15/2020 1325 Last data filed at 07/14/2020 2300 Gross per 24 hour  Intake 100 ml  Output 650 ml  Net -550 ml   Filed Weights   07/07/20 1731  Weight: 104.9 kg    Examination:  General exam: Appears calm and comfortable, sleepy and morbidly obese Respiratory system: Diminished breath sounds bilaterally. Respiratory effort normal. Cardiovascular  system: S1 & S2 heard, RRR. No JVD, murmurs, rubs, gallops or clicks.  +1-2 pitting edema bilateral lower extremity Gastrointestinal system: Abdomen is nondistended, soft and nontender. No organomegaly or masses felt. Normal bowel sounds heard. Central nervous system: Alert and oriented. No focal neurological deficits. Extremities: Symmetric 5 x 5 power. Skin: No rashes, lesions or ulcers.  Psychiatry: Judgement and insight appear poor  Procedures:  None  Microbiology summarized: COVID-19 and influenza PCR nonreactive.  Assessment & Plan: Acute metabolic encephalopathy/delirium-multifactorial including hypercapnia, UTI, CAP, underlying dementia, bipolar disorder and intellectual disability.  Reportedly waxing or waning suggesting delirium.  She was also on steroids for COPD exacerbation which could have contributed.   CT head, RPR, B12 and TSH without acute finding. Per daughter, she always say no to any question.  She is sleepy but arousable. -Treat treatable causes -Reorientation and delirium precautions -Minimize sedating meds. -Minimal oxygen to keep sat above 88%. -Psychiatry decreased Depakote to 500 mg twice daily in the setting of acute thrombocytopenia.  Patient often remains very sleepy.  Although CT head has been done 2 weeks ago which was unremarkable, I had ordered MRI yesterday which is just completed, results pending.  Her Depakote level is within normal range.  Acute on chronic hypoxic and hypercapnic respiratory failure due to COPD exacerbation and CAP -Patient is on trilogy vent and oxygen 2 L as needed at baseline -ABG improved with BiPAP.  Minimal oxygen to keep saturation above 88%.  -Completed 5 days of doxycycline and 3 days of ceftriaxone -Completed course of systemic steroid -Apparently, SNF does not  take patients with trilogy vent.  So BiPAP on discharge  Atrial fibrillation with RVR: RVR resolved.  TTE without significant finding. -Changed metoprolol to  Cardizem given underlying COPD -Continue Eliquis-was on this for DVT -Discontinued as needed hydralazine  Dysphagia: -Dysphagia 2 diet per SLP. -Continue aspiration precautions  UTI-completed antibiotic course with ceftriaxone and Keflex  Oropharyngeal dysphagia -SLP recommended dysphagia 2 diet  Mild hyperkalemia -Corrected with Lokelma.    Uncontrolled hypertension: Improved. -Continue clonidine 0.2 mg twice daily -Continue Cardizem CD 180 mg daily -Discontinued as needed hydralazine  Uncontrolled IDDM-2 with hyperglycemia and neuropathy: A1c 7.2%. Recent Labs  Lab 07/14/20 0631 07/14/20 1648 07/14/20 2148 07/15/20 0746 07/15/20 1239  GLUCAP 134* 202* 182* 141* 156*  -Continue current insulin regimen  History of bipolar disorder -Psychiatry decreased Depakote to 500 mg twice daily (home dose) in the setting of thrombocytopenia. -Patient is also on loxapine 20 mg daily.  Since she is on 10 mg daily SNF.   History of DVT -Continue Eliquis.  Acute urinary retention: Continue indwelling Foley catheter -Continue p.o. bethanechol -Renal ultrasound unremarkable.  Needs to mobilize in order to have Foley removed but she is not moving at all.  Continue Flomax.  We will give her 24 hours.  Will attempt to remove tomorrow.  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  Also on psych medications which could potentially contribute.  Recent Labs  Lab 07/09/20 0104 07/10/20 0531 07/11/20 1312 07/12/20 0124 07/13/20 0345 07/14/20 0551  PLT 96* 87* 93* 91* 88* 77*  -Normal platelet bicitrate.  Platelets stable.  Hematology signed off.  May follow-up as outpatient with hematology if needed   Morbid obesity Body mass index is 38.48 kg/m. Nutrition Problem: Increased nutrient needs Etiology: acute illness Signs/Symptoms: estimated needs Interventions: Refer to RD note for recommendations   DVT  prophylaxis:   apixaban (ELIQUIS) tablet 5 mg  Code Status: DNR/DNI Family Communication: None at bedside.  Level of care: Telemetry Medical Status is: Inpatient  Remains inpatient appropriate because: Unsafe DC plan  Dispo: The patient is from: SNF, insurance declined SNF.  Did peer to peer with insurance company 07/13/2020.  Daughter appealing SNF declination.              Anticipated d/c is to: SNF vs home with home health              Anticipated d/c date is: 1 to 2 days              Patient currently is  medically stable to d/c.   Difficult to place patient No       Consultants:  Psychiatry Hematology-following   Sch Meds:  Scheduled Meds: . apixaban  5 mg Oral BID  . atorvastatin  20 mg Oral QHS  . bethanechol  25 mg Oral TID  . Chlorhexidine Gluconate Cloth  6 each Topical Daily  . cloNIDine  0.2 mg Oral BID  . diltiazem  180 mg Oral Daily  . divalproex  500 mg Oral Q12H  . fluticasone furoate-vilanterol  1 puff Inhalation Daily  . insulin aspart  0-20 Units Subcutaneous TID WC  . insulin aspart  0-5 Units Subcutaneous QHS  . insulin detemir  25 Units Subcutaneous Daily  . loxapine  20 mg Oral QHS  . melatonin  5 mg Oral QHS  . pantoprazole  40 mg Oral Daily  . polyvinyl alcohol  1 drop Both Eyes Q breakfast  . tamsulosin  0.4 mg Oral QPC breakfast   Continuous Infusions: PRN Meds:.acetaminophen, albuterol, clonazepam, diltiazem, polyethylene glycol, Resource ThickenUp Clear, senna-docusate, traMADol  Antimicrobials: Anti-infectives (From admission, onward)   Start     Dose/Rate Route Frequency Ordered Stop   07/03/20 1400  cephALEXin (KEFLEX) capsule 500 mg        500 mg Oral Every 8 hours 07/03/20 0954 07/07/20 1146   06/30/20 2200  doxycycline (VIBRA-TABS) tablet 100 mg        100 mg Oral Every 12 hours 06/30/20 1612 07/05/20 2159   06/30/20 2030  cefTRIAXone (ROCEPHIN) 2 g in sodium chloride 0.9 % 100 mL IVPB  Status:  Discontinued        2 g 200  mL/hr over 30 Minutes Intravenous Daily at bedtime 06/30/20 2006 07/03/20 0954   06/30/20 1800  Ampicillin-Sulbactam (UNASYN) 3 g in sodium chloride 0.9 % 100 mL IVPB  Status:  Discontinued        3 g 200 mL/hr over 30 Minutes Intravenous Every 6 hours 06/30/20 1703 06/30/20 1954       I have personally reviewed the following labs and images: CBC: Recent Labs  Lab 07/10/20 0531 07/11/20 1312 07/12/20 0124 07/13/20 0345 07/14/20 0551  WBC 5.5 4.4 5.0 3.8* 3.9*  NEUTROABS  --  3.0 3.3 2.1 2.3  HGB 11.2* 12.0 11.3* 11.3* 10.8*  HCT 37.2 37.2 36.9 36.3 35.6*  MCV 98.9 97.6 98.4 96.5 99.4  PLT 87* 93* 91* 88* 77*   BMP &GFR Recent Labs  Lab 07/09/20 0104 07/10/20 0531 07/14/20 0551  NA 140 141 140  K 4.0 3.7 3.9  CL 93* 92* 91*  CO2 36* 38* 39*  GLUCOSE 181* 229* 172*  BUN 17 16 26*  CREATININE 0.32* 0.40* 0.48  CALCIUM 8.3* 8.5* 8.4*  MG 1.7 1.8  --   PHOS 2.9 3.9  --    Estimated Creatinine Clearance: 82.1 mL/min (by C-G formula based on SCr of 0.48 mg/dL). Liver & Pancreas: Recent Labs  Lab 07/09/20 0104 07/10/20 0531  ALBUMIN 2.3* 2.5*   No results for input(s): LIPASE, AMYLASE in the last 168 hours. Recent Labs  Lab 07/09/20 0104  AMMONIA 34   Diabetic: No results for input(s): HGBA1C in the last 72 hours. Recent Labs  Lab 07/14/20 0631 07/14/20 1648 07/14/20 2148 07/15/20 0746 07/15/20 1239  GLUCAP 134* 202* 182* 141* 156*   Cardiac Enzymes: No results for input(s): CKTOTAL, CKMB, CKMBINDEX, TROPONINI in the last 168 hours. No results for input(s): PROBNP in the last 8760 hours. Coagulation Profile: No results for input(s): INR, PROTIME in the last 168 hours. Thyroid Function Tests: No results for input(s): TSH, T4TOTAL, FREET4, T3FREE, THYROIDAB in the last 72 hours. Lipid Profile: No results for input(s): CHOL, HDL, LDLCALC, TRIG, CHOLHDL, LDLDIRECT in the last 72 hours. Anemia Panel: No results for input(s): VITAMINB12, FOLATE, FERRITIN,  TIBC, IRON, RETICCTPCT in the last 72 hours. Urine analysis:    Component Value Date/Time   COLORURINE YELLOW 06/30/2020 1211   APPEARANCEUR TURBID (A) 06/30/2020 1211   LABSPEC 1.023 06/30/2020 1211   PHURINE 5.0 06/30/2020 1211   GLUCOSEU NEGATIVE 06/30/2020 1211   HGBUR NEGATIVE 06/30/2020 1211   BILIRUBINUR NEGATIVE 06/30/2020 1211   KETONESUR 5 (A) 06/30/2020 1211   PROTEINUR 100 (A) 06/30/2020 1211   NITRITE POSITIVE (A) 06/30/2020 1211   LEUKOCYTESUR LARGE (A) 06/30/2020 1211   Sepsis Labs: Invalid input(s): PROCALCITONIN, LACTICIDVEN  Microbiology: Recent Results (from the past 240 hour(s))  SARS CORONAVIRUS  2 (TAT 6-24 HRS) Nasopharyngeal Nasopharyngeal Swab     Status: None   Collection Time: 07/09/20  3:06 PM   Specimen: Nasopharyngeal Swab  Result Value Ref Range Status   SARS Coronavirus 2 NEGATIVE NEGATIVE Final    Comment: (NOTE) SARS-CoV-2 target nucleic acids are NOT DETECTED.  The SARS-CoV-2 RNA is generally detectable in upper and lower respiratory specimens during the acute phase of infection. Negative results do not preclude SARS-CoV-2 infection, do not rule out co-infections with other pathogens, and should not be used as the sole basis for treatment or other patient management decisions. Negative results must be combined with clinical observations, patient history, and epidemiological information. The expected result is Negative.  Fact Sheet for Patients: HairSlick.nohttps://www.fda.gov/media/138098/download  Fact Sheet for Healthcare Providers: quierodirigir.comhttps://www.fda.gov/media/138095/download  This test is not yet approved or cleared by the [place] FDA and  has been authorized for detection and/or diagnosis of SARS-CoV-2 by FDA under an Emergency Use Authorization (EUA). This EUA will remain  in effect (meaning this test can be used) for the duration of the COVID-19 declaration under Se ction 564(b)(1) of the Act, 21 U.S.C. section 360bbb-3(b)(1), unless  the authorization is terminated or revoked sooner.  Performed at [facility], [address] [place], [place] [address]   Urine Culture     Status: Abnormal   Collection Time: 07/11/20  4:14 PM   Specimen: Urine, Catheterized  Result Value Ref Range Status   Specimen Description URINE, CATHETERIZED  Final   Special Requests   Final    NONE Performed at [facility], [address] [place], [place] [address]    Culture >=100,000 COLONIES/mL ENTEROCOCCUS FAECALIS (A)  Final   Report Status 07/15/2020 FINAL  Final   Organism ID, Bacteria ENTEROCOCCUS FAECALIS (A)  Final      Susceptibility   Enterococcus faecalis - MIC*    AMPICILLIN <=2 SENSITIVE Sensitive     NITROFURANTOIN <=16 SENSITIVE Sensitive     VANCOMYCIN 1 SENSITIVE Sensitive     * >=100,000 COLONIES/mL ENTEROCOCCUS FAECALIS    Radiology Studies: No results found.   Hughie Clossavi Anida Deol, MD [facility]  If 7PM-7AM, please contact night-coverage [email or web address] 07/15/2020, 1:25 PM

## 2020-07-15 NOTE — TOC Progression Note (Signed)
Transition of Care [facility]) - Progression Note    Patient Details  Name: Olinda Nola MRN: [id number] Date of Birth: [birth year]  Transition of Care [facility]) CM/SW Contact  [name], LCSW Phone Number: 07/15/2020, 10:41 AM  Clinical Narrative:   CSW spoke with Sunny Schlein at [facility] med DME company.  [phone].  Cancelled wheel chair order.  Felicia confirmed that pt will not be eligible for another bed and they do not have other models available.  Reviewed hoyer lift order--specific language added to the hoyer lift order.  Informed her that family appealed SNF denial and she will hold orders until pt disposition is determined.   CSW also spoke with Darl Householder, SLP, about the daughter request regaring puree food.  Yvone Neu reports that pt is not at that level of need for diet currently.  Yvone Neu will reach out to daughter to discuss a little more.    Expected Discharge Plan: Skilled Nursing Facility Barriers to Discharge: Continued Medical Work up  Expected Discharge Plan and Services Expected Discharge Plan: Skilled Nursing Facility     Post Acute Care Choice:  (daughter requesting LTAC) Living arrangements for the past 2 months: Skilled Nursing Facility (was hospitalized at [facility] for 30+ days prior to SNF)                           HH Arranged: PT,OT,Nurse's Aide HH Agency: [facility] Date [facility] Agency Contacted: 07/14/20 Time HH Agency Contacted: 1300 Representative spoke with at [facility] Agency: Kandee Keen   Social Determinants of Health (SDOH) Interventions    Readmission Risk Interventions No flowsheet data found.

## 2020-07-15 NOTE — TOC Progression Note (Signed)
Transition of Care [facility]) - Progression Note    Patient Details  Name: Stephanie Rice MRN: [id number] Date of Birth: 02-Aug-1955  Transition of Care [facility]) CM/SW Contact  [name], LCSW Phone Number: 07/15/2020, 9:44 AM  Clinical Narrative:   CSW called [employer] to check on SNF auth appeal status: [phone].  CSW given different number to call: [phone].  CSW was told that the appeal had been made and was with the navigator, determination will be made within 72 hours and will be communicated to the person who made the appeal.     Expected Discharge Plan: Skilled Nursing Facility Barriers to Discharge: Continued Medical Work up  Expected Discharge Plan and Services Expected Discharge Plan: Skilled Nursing Facility     Post Acute Care Choice:  (daughter requesting LTAC) Living arrangements for the past 2 months: Skilled Nursing Facility (was hospitalized at [facility] for 30+ days prior to SNF)                           HH Arranged: PT,OT,Nurse's Aide HH Agency: [facility] Date [facility] Agency Contacted: 07/14/20 Time HH Agency Contacted: 1300 Representative spoke with at [facility] Agency: Kandee Keen   Social Determinants of Health (SDOH) Interventions    Readmission Risk Interventions No flowsheet data found.

## 2020-07-16 ENCOUNTER — Inpatient Hospital Stay (HOSPITAL_COMMUNITY): Payer: Medicare Other

## 2020-07-16 DIAGNOSIS — J441 Chronic obstructive pulmonary disease with (acute) exacerbation: Secondary | ICD-10-CM | POA: Diagnosis not present

## 2020-07-16 LAB — GLUCOSE, CAPILLARY
Glucose-Capillary: 150 mg/dL — ABNORMAL HIGH (ref 70–99)
Glucose-Capillary: 235 mg/dL — ABNORMAL HIGH (ref 70–99)
Glucose-Capillary: 136 mg/dL — ABNORMAL HIGH (ref 70–99)
Glucose-Capillary: 143 mg/dL — ABNORMAL HIGH (ref 70–99)

## 2020-07-16 LAB — CBC WITH DIFFERENTIAL/PLATELET
Abs Immature Granulocytes: 0.02 10*3/uL (ref 0.00–0.07)
Basophils Absolute: 0 10*3/uL (ref 0.0–0.1)
Basophils Relative: 0 %
Eosinophils Absolute: 0 10*3/uL (ref 0.0–0.5)
Eosinophils Relative: 1 %
HCT: 32.4 % — ABNORMAL LOW (ref 36.0–46.0)
Hemoglobin: 10.3 g/dL — ABNORMAL LOW (ref 12.0–15.0)
Immature Granulocytes: 1 %
Lymphocytes Relative: 32 %
Lymphs Abs: 1.1 10*3/uL (ref 0.7–4.0)
MCH: 30.9 pg (ref 26.0–34.0)
MCHC: 31.8 g/dL (ref 30.0–36.0)
MCV: 97.3 fL (ref 80.0–100.0)
Monocytes Absolute: 0.5 10*3/uL (ref 0.1–1.0)
Monocytes Relative: 15 %
Neutro Abs: 1.8 10*3/uL (ref 1.7–7.7)
Neutrophils Relative %: 51 %
Platelets: 91 10*3/uL — ABNORMAL LOW (ref 150–400)
RBC: 3.33 MIL/uL — ABNORMAL LOW (ref 3.87–5.11)
RDW: 14.2 % (ref 11.5–15.5)
WBC: 3.6 10*3/uL — ABNORMAL LOW (ref 4.0–10.5)
nRBC: 0 % (ref 0.0–0.2)

## 2020-07-16 LAB — BASIC METABOLIC PANEL
Anion gap: 10 (ref 5–15)
BUN: 16 mg/dL (ref 8–23)
CO2: 37 mmol/L — ABNORMAL HIGH (ref 22–32)
Calcium: 8.5 mg/dL — ABNORMAL LOW (ref 8.9–10.3)
Chloride: 94 mmol/L — ABNORMAL LOW (ref 98–111)
Creatinine, Ser: 0.43 mg/dL — ABNORMAL LOW (ref 0.44–1.00)
GFR, Estimated: >60 mL/min (ref >60)
Glucose, Bld: 197 mg/dL — ABNORMAL HIGH (ref 70–99)
Potassium: 4 mmol/L (ref 3.5–5.1)
Sodium: 141 mmol/L (ref 135–145)

## 2020-07-16 NOTE — Progress Notes (Signed)
PROGRESS NOTE  Stephanie Rice XLK:[id number] DOB: August 30, 1955   PCP: [name], PA-C  Patient is from: SNF  DOA: 06/30/2020 LOS: 16  Chief complaints: Altered mental status  Brief Narrative / Interim history: 65 year old F with PMH of dementia, intellectual disability, COPD/chronic RF on trilogy vent and 2 L oxygen as needed, recent DVT, morbid obesity, IDDM-2, bipolar disorder and HTN brought to ED from SNF with altered mental status and admitted for acute metabolic encephalopathy in the setting of hypercapnia from acute on chronic respiratory failure and COPD exacerbation, and urinary tract infection.  Treated with steroid, antibiotics and BiPAP with improvement in ABG but waxing and waning mental status and some concern about behavioral issue.  Psychiatry consulted and gave recommendation.  Hospital course also complicated by acute thrombocytopenia and acute urinary retention.  Hematology consulted for thrombocytopenia.  Psychiatry also adjusted psych medications which could potentially contribute to thrombocytopenia.  She has indwelling Foley catheter for acute urinary retention   Subjective: Patient seen and examined.  For the first time, patient is significantly alert and oriented x2.  Denied any complaint.  Noted that patient is now requiring more oxygen/6 L compared to 4 L.  No shortness of breath chest x-ray shows improving infiltrates.  Objective: Vitals:   07/15/20 1559 07/15/20 1954 07/16/20 0501 07/16/20 0838  BP: (!) 148/58 (!) 140/48 135/73   Pulse: 60 75 73   Resp: 20  18   Temp: 97.6 F (36.4 C) 98.1 F (36.7 C) 97.8 F (36.6 C)   TempSrc: Axillary Axillary Axillary   SpO2: 99% 95%  96%  Weight:      Height:        Intake/Output Summary (Last 24 hours) at 07/16/2020 1334 Last data filed at 07/16/2020 0324 Gross per 24 hour  Intake -  Output 150 ml  Net -150 ml   Filed Weights   07/07/20 1731  Weight: 104.9 kg    Examination:  General exam: Appears  calm and comfortable, much more awake today Respiratory system: Diminished breath sounds bilaterlly. Respiratory effort normal. Cardiovascular system: S1 & S2 heard, RRR. No JVD, murmurs, rubs, gallops or clicks.  Trace pitting edema bilateral lower extremity. Gastrointestinal system: Abdomen is nondistended, soft and nontender. No organomegaly or masses felt. Normal bowel sounds heard. Central nervous system: Alert and oriented x1. No focal deficit. Extremities: Symmetric 5 x 5 power. Skin: No rashes, lesions or ulcers.  Psychiatry: Judgement and insight appear poor.  Procedures:  None  Microbiology summarized: COVID-19 and influenza PCR nonreactive.  Assessment & Plan: Acute metabolic encephalopathy/delirium-multifactorial including hypercapnia, UTI, CAP, underlying dementia, bipolar disorder and intellectual disability.  Reportedly waxing or waning suggesting delirium.  She was also on steroids for COPD exacerbation which could have contributed.   CT head, RPR, B12 and TSH without acute finding. Per daughter, she always say no to any question.  She is sleepy but arousable. -Treat treatable causes -Reorientation and delirium precautions -Minimize sedating meds. -Minimal oxygen to keep sat above 88%. -Psychiatry decreased Depakote to 500 mg twice daily in the setting of acute thrombocytopenia.  Patient often remains very sleepy.  CT and MRI brain unremarkable her Depakote level is within normal range.  Acute on chronic hypoxic and hypercapnic respiratory failure due to COPD exacerbation and CAP -Patient is on trilogy vent and oxygen 2 L as needed at baseline -ABG improved with BiPAP.  Minimal oxygen to keep saturation above 88%.  -Completed 5 days of doxycycline and 3 days of ceftriaxone -Completed course  of systemic steroid -Apparently, SNF does not take patients with trilogy vent.  So BiPAP on discharge.  Currently on 6 L.  Wean as able to.  Atrial fibrillation with RVR: RVR  resolved.  TTE without significant finding. -Changed metoprolol to Cardizem given underlying COPD -Continue Eliquis-was on this for DVT -Discontinued as needed hydralazine  Dysphagia: -Dysphagia 2 diet per SLP. -Continue aspiration precautions  UTI-completed antibiotic course with ceftriaxone and Keflex  Oropharyngeal dysphagia -SLP recommended dysphagia 2 diet  Mild hyperkalemia -Corrected with Lokelma.    Uncontrolled hypertension: Improved. -Continue clonidine 0.2 mg twice daily -Continue Cardizem CD 180 mg daily -Discontinued as needed hydralazine  Uncontrolled IDDM-2 with hyperglycemia and neuropathy: A1c 7.2%. Recent Labs  Lab 07/15/20 1239 07/15/20 1546 07/15/20 1958 07/16/20 0911 07/16/20 1130  GLUCAP 156* 103* 145* 150* 235*  -Continue current insulin regimen  History of bipolar disorder -Psychiatry decreased Depakote to 500 mg twice daily (home dose) in the setting of thrombocytopenia. -Patient is also on loxapine 20 mg daily.  Since she is on 10 mg daily SNF.   History of DVT -Continue Eliquis.  Acute urinary retention: Continue indwelling Foley catheter -Continue p.o. bethanechol -Renal ultrasound unremarkable.  Needs to mobilize in order to have Foley removed but she is not moving at all.  Continue Flomax.  Will discontinue Foley catheter and hope for successful voiding trial.  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  Also on psych medications which could potentially contribute.  Recent Labs  Lab 07/10/20 0531 07/11/20 1312 07/12/20 0124 07/13/20 0345 07/14/20 0551 07/15/20 1302 07/16/20 0206  PLT 87* 93* 91* 88* 77* 75* 91*  -Normal platelet bicitrate.  Platelets stable.  Hematology signed off.  May follow-up as outpatient with hematology if needed   Morbid obesity Body mass index is 38.48 kg/m. Nutrition Problem: Increased nutrient needs Etiology: acute  illness Signs/Symptoms: estimated needs Interventions: Refer to RD note for recommendations   DVT prophylaxis:   apixaban (ELIQUIS) tablet 5 mg  Code Status: DNR/DNI Family Communication: None at bedside.  Level of care: Telemetry Medical Status is: Inpatient  Remains inpatient appropriate because: Unsafe DC plan  Dispo: The patient is from: SNF, insurance declined SNF.  Did peer to peer with insurance company 07/13/2020.  Daughter appealing SNF declination.  Waiting for decision              Anticipated d/c is to: SNF vs home with home health              Anticipated d/c date is: 1 to 2 days              Patient currently is  medically stable to d/c.   Difficult to place patient No       Consultants:  Psychiatry Hematology-following   Sch Meds:  Scheduled Meds: . apixaban  5 mg Oral BID  . atorvastatin  20 mg Oral QHS  . bethanechol  25 mg Oral TID  . Chlorhexidine Gluconate Cloth  6 each Topical Daily  . cloNIDine  0.2 mg Oral BID  . diltiazem  180 mg Oral Daily  . divalproex  500 mg Oral Q12H  . fluticasone furoate-vilanterol  1 puff Inhalation Daily  . insulin aspart  0-20 Units Subcutaneous TID WC  . insulin aspart  0-5 Units Subcutaneous QHS  . insulin detemir  25 Units Subcutaneous Daily  . loxapine  20 mg Oral QHS  . melatonin  5 mg Oral QHS  .  pantoprazole  40 mg Oral Daily  . polyvinyl alcohol  1 drop Both Eyes Q breakfast  . tamsulosin  0.4 mg Oral QPC breakfast   Continuous Infusions: PRN Meds:.acetaminophen, albuterol, clonazepam, diltiazem, polyethylene glycol, Resource ThickenUp Clear, senna-docusate, traMADol  Antimicrobials: Anti-infectives (From admission, onward)   Start     Dose/Rate Route Frequency Ordered Stop   07/03/20 1400  cephALEXin (KEFLEX) capsule 500 mg        500 mg Oral Every 8 hours 07/03/20 0954 07/07/20 1146   06/30/20 2200  doxycycline (VIBRA-TABS) tablet 100 mg        100 mg Oral Every 12 hours 06/30/20 1612 07/05/20 2159    06/30/20 2030  cefTRIAXone (ROCEPHIN) 2 g in sodium chloride 0.9 % 100 mL IVPB  Status:  Discontinued        2 g 200 mL/hr over 30 Minutes Intravenous Daily at bedtime 06/30/20 2006 07/03/20 0954   06/30/20 1800  Ampicillin-Sulbactam (UNASYN) 3 g in sodium chloride 0.9 % 100 mL IVPB  Status:  Discontinued        3 g 200 mL/hr over 30 Minutes Intravenous Every 6 hours 06/30/20 1703 06/30/20 1954       I have personally reviewed the following labs and images: CBC: Recent Labs  Lab 07/12/20 0124 07/13/20 0345 07/14/20 0551 07/15/20 1302 07/16/20 0206  WBC 5.0 3.8* 3.9* 3.9* 3.6*  NEUTROABS 3.3 2.1 2.3 2.1 1.8  HGB 11.3* 11.3* 10.8* 12.2 10.3*  HCT 36.9 36.3 35.6* 38.5 32.4*  MCV 98.4 96.5 99.4 98.0 97.3  PLT 91* 88* 77* 75* 91*   BMP &GFR Recent Labs  Lab 07/10/20 0531 07/14/20 0551 07/16/20 0206  NA 141 140 141  K 3.7 3.9 4.0  CL 92* 91* 94*  CO2 38* 39* 37*  GLUCOSE 229* 172* 197*  BUN 16 26* 16  CREATININE 0.40* 0.48 0.43*  CALCIUM 8.5* 8.4* 8.5*  MG 1.8  --   --   PHOS 3.9  --   --    Estimated Creatinine Clearance: 82.1 mL/min (A) (by C-G formula based on SCr of 0.43 mg/dL (L)). Liver & Pancreas: Recent Labs  Lab 07/10/20 0531  ALBUMIN 2.5*   No results for input(s): LIPASE, AMYLASE in the last 168 hours. No results for input(s): AMMONIA in the last 168 hours. Diabetic: No results for input(s): HGBA1C in the last 72 hours. Recent Labs  Lab 07/15/20 1239 07/15/20 1546 07/15/20 1958 07/16/20 0911 07/16/20 1130  GLUCAP 156* 103* 145* 150* 235*   Cardiac Enzymes: No results for input(s): CKTOTAL, CKMB, CKMBINDEX, TROPONINI in the last 168 hours. No results for input(s): PROBNP in the last 8760 hours. Coagulation Profile: No results for input(s): INR, PROTIME in the last 168 hours. Thyroid Function Tests: No results for input(s): TSH, T4TOTAL, FREET4, T3FREE, THYROIDAB in the last 72 hours. Lipid Profile: No results for input(s): CHOL, HDL,  LDLCALC, TRIG, CHOLHDL, LDLDIRECT in the last 72 hours. Anemia Panel: No results for input(s): VITAMINB12, FOLATE, FERRITIN, TIBC, IRON, RETICCTPCT in the last 72 hours. Urine analysis:    Component Value Date/Time   COLORURINE YELLOW 06/30/2020 1211   APPEARANCEUR TURBID (A) 06/30/2020 1211   LABSPEC 1.023 06/30/2020 1211   PHURINE 5.0 06/30/2020 1211   GLUCOSEU NEGATIVE 06/30/2020 1211   HGBUR NEGATIVE 06/30/2020 1211   BILIRUBINUR NEGATIVE 06/30/2020 1211   KETONESUR 5 (A) 06/30/2020 1211   PROTEINUR 100 (A) 06/30/2020 1211   NITRITE POSITIVE (A) 06/30/2020 1211   LEUKOCYTESUR LARGE (A) 06/30/2020  1211   Sepsis Labs: Invalid input(s): PROCALCITONIN, LACTICIDVEN  Microbiology: Recent Results (from the past 240 hour(s))  SARS CORONAVIRUS 2 (TAT 6-24 HRS) Nasopharyngeal Nasopharyngeal Swab     Status: None   Collection Time: 07/09/20  3:06 PM   Specimen: Nasopharyngeal Swab  Result Value Ref Range Status   SARS Coronavirus 2 NEGATIVE NEGATIVE Final    Comment: (NOTE) SARS-CoV-2 target nucleic acids are NOT DETECTED.  The SARS-CoV-2 RNA is generally detectable in upper and lower respiratory specimens during the acute phase of infection. Negative results do not preclude SARS-CoV-2 infection, do not rule out co-infections with other pathogens, and should not be used as the sole basis for treatment or other patient management decisions. Negative results must be combined with clinical observations, patient history, and epidemiological information. The expected result is Negative.  Fact Sheet for Patients: HairSlick.nohttps://www.fda.gov/media/138098/download  Fact Sheet for Healthcare Providers: quierodirigir.comhttps://www.fda.gov/media/138095/download  This test is not yet approved or cleared by the [place] FDA and  has been authorized for detection and/or diagnosis of SARS-CoV-2 by FDA under an Emergency Use Authorization (EUA). This EUA will remain  in effect (meaning this test can be used)  for the duration of the COVID-19 declaration under Se ction 564(b)(1) of the Act, 21 U.S.C. section 360bbb-3(b)(1), unless the authorization is terminated or revoked sooner.  Performed at [facility], [address] [place], [place] [address]   Urine Culture     Status: Abnormal   Collection Time: 07/11/20  4:14 PM   Specimen: Urine, Catheterized  Result Value Ref Range Status   Specimen Description URINE, CATHETERIZED  Final   Special Requests   Final    NONE Performed at [facility], [address] [place], [place] [address]    Culture >=100,000 COLONIES/mL ENTEROCOCCUS FAECALIS (A)  Final   Report Status 07/15/2020 FINAL  Final   Organism ID, Bacteria ENTEROCOCCUS FAECALIS (A)  Final      Susceptibility   Enterococcus faecalis - MIC*    AMPICILLIN <=2 SENSITIVE Sensitive     NITROFURANTOIN <=16 SENSITIVE Sensitive     VANCOMYCIN 1 SENSITIVE Sensitive     * >=100,000 COLONIES/mL ENTEROCOCCUS FAECALIS    Radiology Studies: DG CHEST PORT 1 VIEW  Result Date: 07/16/2020 CLINICAL DATA:  Altered mental status. EXAM: PORTABLE CHEST 1 VIEW COMPARISON:  06/30/2020. FINDINGS: Patient rotated to the left. Stable cardiomegaly. Low lung volumes. Persistent but improved right base atelectasis/infiltrate. Mild left base atelectasis and infiltrate cannot be excluded. Small left pleural effusion, improved from prior exam. No pneumothorax. Degenerative change thoracic spine. Degenerative changes right shoulder. IMPRESSION: 1.  Stable cardiomegaly. 2. Low lung volumes. Persistent but improved right base atelectasis/infiltrate. Mild left base atelectasis and infiltrate cannot be excluded. Small left pleural effusion, improved from prior exam. Electronically Signed   By: Maisie Fushomas  Register   On: 07/16/2020 13:03     Hughie Clossavi Pahwani, MD [facility]  If 7PM-7AM, please contact night-coverage [email or web address] 07/16/2020, 1:34 PM

## 2020-07-16 NOTE — TOC Progression Note (Signed)
Transition of Care [facility]) - Progression Note    Patient Details  Name: Stephanie Rice MRN: [id number] Date of Birth: 17-Dec-1955  [facility]) CM/SW Contact  [name], LCSW Phone Number: 07/16/2020, 8:49 AM  Clinical Narrative:   Secure chat from Darl Householder, SLP.  She has called pt daughter several times and so far no response.     Expected Discharge Plan: Skilled Nursing Facility Barriers to Discharge: Continued Medical Work up  Expected Discharge Plan and Services Expected Discharge Plan: Skilled Nursing Facility     Post Acute Care Choice:  (daughter requesting LTAC) Living arrangements for the past 2 months: Skilled Nursing Facility (was hospitalized at [place] for 30+ days prior to SNF)                           HH Arranged: PT,OT,Nurse's Aide HH Agency: [facility] Date [facility] Agency Contacted: 07/14/20 Time HH Agency Contacted: 1300 Representative spoke with at [facility] Agency: Kandee Keen   Social Determinants of Health (SDOH) Interventions    Readmission Risk Interventions No flowsheet data found.

## 2020-07-16 NOTE — Progress Notes (Signed)
Physical Therapy Treatment Patient Details Name: Stephanie Rice MRN: [id number] DOB: [birth year] Today's Date: 07/16/2020    History of Present Illness Stephanie Rice is a 65 y.o. female with medical history significant of chronic hypoxic and hypercapnic respiratory failure secondary COPD, on Trelegy PRN +2 L PRN, HTN, recent diagnosed DVT, morbid obesity, IDDM, Dementia, bipolar disorder, presented with altered mentation.    PT Comments    Patient initially jovial and seeming to enjoy doing AROM exercises in a "follow the leader" type manner. She expressed pain in her right "toe" with attempt to add resistance to extension component of heeslide and became slightly agitated. Able to engage her to complete a few more reps of other exercise and then she stated she was done. Received word that daughter would like for PT to call her. Will call this afternoon.     Follow Up Recommendations  SNF;Supervision/Assistance - 24 hour (noted SNF denial is under appeal)     Equipment Recommendations  Other (comment) (hoyer with hoyer pad (noted daughter does not want w/c and insurance won't pay for another hospital bed))    Recommendations for Other Services       Precautions / Restrictions Precautions Precautions: Fall;Other (comment) Precaution Comments: monitor O2, delirium    Mobility  Bed Mobility Overal bed mobility: Needs Assistance Bed Mobility: Rolling Rolling: Mod assist         General bed mobility comments: pt initiates (with cues) to bend opposite leg, turn head, and reach across towards rail; assists with UEs but requires mod assist to complete rolling onto her side    Transfers                 General transfer comment: unable to progress beyond EOB  Ambulation/Gait             General Gait Details: unable due to cognition   Stairs             Wheelchair Mobility    Modified Rankin (Stroke Patients Only)       Balance                                             Cognition Arousal/Alertness: Awake/alert Behavior During Therapy: Flat affect;Restless Overall Cognitive Status: No family/caregiver present to determine baseline cognitive functioning Area of Impairment: Orientation;Attention;Following commands;Safety/judgement;Awareness;Problem solving;Memory                 Orientation Level: Disoriented to;Place;Time;Situation Current Attention Level: Sustained;Focused Memory: Decreased short-term memory Following Commands: Follows one step commands inconsistently;Follows one step commands with increased time Safety/Judgement: Decreased awareness of safety;Decreased awareness of deficits Awareness: Intellectual Problem Solving: Slow processing;Difficulty sequencing;Requires verbal cues;Requires tactile cues General Comments: following single step commands more consistently and with less delay; once she decides she has done all she will, she shuts down      Exercises General Exercises - Lower Extremity Short Arc Quad: AROM;Both;10 reps;Supine Heel Slides: AAROM;Right;Left;Supine;5 reps Other Exercises Other Exercises: shoulder flexion, unilaterally and then bilaterally x 10 reps each total Other Exercises: elbow flexion x 10 reps    General Comments General comments (skin integrity, edema, etc.): initially laughing and able to make a game of her repeating what I do      Pertinent Vitals/Pain Pain Assessment: Faces Faces Pain Scale: Hurts whole lot Pain Location: rt toe(s) (pain only with resisted hip/knee extension during heelslide;  no discoloration noted, pt cannot identify which toe(s) hurt) Pain Descriptors / Indicators: Grimacing;Guarding;Discomfort;Moaning Pain Intervention(s): Limited activity within patient's tolerance;Monitored during session    Home Living                      Prior Function            PT Goals (current goals can now be found in the care plan section)  Acute Rehab PT Goals Patient Stated Goal: unable to state PT Goal Formulation: Patient unable to participate in goal setting Time For Goal Achievement: 07/30/20 Progress towards PT goals: Goals downgraded-see care plan    Frequency    Min 2X/week      PT Plan Equipment recommendations need to be updated    Co-evaluation              AM-PAC PT "6 Clicks" Mobility   Outcome Measure  Help needed turning from your back to your side while in a flat bed without using bedrails?: A Lot Help needed moving from lying on your back to sitting on the side of a flat bed without using bedrails?: A Lot Help needed moving to and from a bed to a chair (including a wheelchair)?: Total Help needed standing up from a chair using your arms (e.g., wheelchair or bedside chair)?: Total Help needed to walk in hospital room?: Total Help needed climbing 3-5 steps with a railing? : Total 6 Click Score: 8    End of Session Equipment Utilized During Treatment: Oxygen Activity Tolerance: Other (comment) (limited by cognition.) Patient left: in bed;with call bell/phone within reach;with bed alarm set Nurse Communication: Mobility status;Need for lift equipment PT Visit Diagnosis: Other abnormalities of gait and mobility (R26.89)     Time: 9357-0177 PT Time Calculation (min) (ACUTE ONLY): 18 min  Charges:  $Therapeutic Exercise: 8-22 mins                      Jerolyn Center, PT Pager [phone]    Zena Amos 07/16/2020, 12:38 PM

## 2020-07-16 NOTE — Progress Notes (Signed)
@  0303 Pt was found with an O2 of 85%. Pt had nasal cannula out of place. I attempted to readjust and pt would cover her nose with blanket so I could not readjust and kept saying "No". I explained to pt she needed the nasal canula in place. She readjusted on her own but would not let me touch her. O2 level recovered to 100%

## 2020-07-17 DIAGNOSIS — D696 Thrombocytopenia, unspecified: Secondary | ICD-10-CM | POA: Diagnosis not present

## 2020-07-17 DIAGNOSIS — J441 Chronic obstructive pulmonary disease with (acute) exacerbation: Secondary | ICD-10-CM | POA: Diagnosis not present

## 2020-07-17 LAB — BASIC METABOLIC PANEL
Anion gap: 12 (ref 5–15)
BUN: 15 mg/dL (ref 8–23)
CO2: 35 mmol/L — ABNORMAL HIGH (ref 22–32)
Calcium: 8.5 mg/dL — ABNORMAL LOW (ref 8.9–10.3)
Chloride: 93 mmol/L — ABNORMAL LOW (ref 98–111)
Creatinine, Ser: 0.33 mg/dL — ABNORMAL LOW (ref 0.44–1.00)
GFR, Estimated: >60 mL/min (ref >60)
Glucose, Bld: 262 mg/dL — ABNORMAL HIGH (ref 70–99)
Potassium: 3.5 mmol/L (ref 3.5–5.1)
Sodium: 140 mmol/L (ref 135–145)

## 2020-07-17 LAB — GLUCOSE, CAPILLARY
Glucose-Capillary: 180 mg/dL — ABNORMAL HIGH (ref 70–99)
Glucose-Capillary: 215 mg/dL — ABNORMAL HIGH (ref 70–99)
Glucose-Capillary: 210 mg/dL — ABNORMAL HIGH (ref 70–99)
Glucose-Capillary: 193 mg/dL — ABNORMAL HIGH (ref 70–99)

## 2020-07-17 LAB — CBC WITH DIFFERENTIAL/PLATELET
Abs Immature Granulocytes: 0.03 10*3/uL (ref 0.00–0.07)
Basophils Absolute: 0 10*3/uL (ref 0.0–0.1)
Basophils Relative: 1 %
Eosinophils Absolute: 0.1 10*3/uL (ref 0.0–0.5)
Eosinophils Relative: 2 %
HCT: 33.6 % — ABNORMAL LOW (ref 36.0–46.0)
Hemoglobin: 10.9 g/dL — ABNORMAL LOW (ref 12.0–15.0)
Immature Granulocytes: 1 %
Lymphocytes Relative: 26 %
Lymphs Abs: 0.9 10*3/uL (ref 0.7–4.0)
MCH: 31.3 pg (ref 26.0–34.0)
MCHC: 32.4 g/dL (ref 30.0–36.0)
MCV: 96.6 fL (ref 80.0–100.0)
Monocytes Absolute: 0.5 10*3/uL (ref 0.1–1.0)
Monocytes Relative: 15 %
Neutro Abs: 1.8 10*3/uL (ref 1.7–7.7)
Neutrophils Relative %: 55 %
Platelets: 96 10*3/uL — ABNORMAL LOW (ref 150–400)
RBC: 3.48 MIL/uL — ABNORMAL LOW (ref 3.87–5.11)
RDW: 14 % (ref 11.5–15.5)
WBC: 3.3 10*3/uL — ABNORMAL LOW (ref 4.0–10.5)
nRBC: 0 % (ref 0.0–0.2)

## 2020-07-17 MED ORDER — INSULIN DETEMIR 100 UNIT/ML ~~LOC~~ SOLN
30.0000 [IU] | Freq: Every day | SUBCUTANEOUS | Status: DC
  Administered 2020-07-18 – 2020-07-29 (×12): 30 [IU] via SUBCUTANEOUS
  Filled 2020-07-17 (×13): qty 0.3

## 2020-07-17 NOTE — Progress Notes (Signed)
Overall, Ms. Milner platelet count is holding steady.  It is 96,000 today.  Hopefully, it will continue to slowly improve.  I noted that her white cell count is slowly trending downward.  Again, I really not to worry about this.  I have to believe that she has some possible underlying autoimmune issue.  I know that she is on quite a few medications.  I think we just need to follow her blood counts along.  I just think given all of her issues, he does have to be conservative.  She is having no symptoms.  I see that she was recently found to have an Enterococcus urinary tract infection.  I do not know if this is going to be treated or has been treated.  There is concern may be a factor with respect to her blood counts.  We will continue to follow along.  Christin Bach, MD

## 2020-07-17 NOTE — Progress Notes (Signed)
Occupational Therapy Treatment Patient Details Name: Stephanie Rice MRN: [id number] DOB: 10-08-1955 Today's Date: 07/17/2020    History of present illness Stephanie Rice is a 65 y.o. female with medical history significant of chronic hypoxic and hypercapnic respiratory failure secondary COPD, on Trelegy PRN +2 L PRN, HTN, recent diagnosed DVT, morbid obesity, IDDM, Dementia, bipolar disorder, presented with altered mentation.   OT comments  This 65 yo female seen today with focus on self feeding, grooming, and UE exercises. She was able to self feed ~25% of her meal, wash her face and hands once wash cloth presented to her and work on catch and toss at bed level. She will continue to benefit from acute OT with follow up at SNF.  Follow Up Recommendations  SNF;Supervision/Assistance - 24 hour    Equipment Recommendations  Wheelchair (measurements OT);Wheelchair cushion (measurements OT);Hospital bed       Precautions / Restrictions Precautions Precautions: Fall Precaution Comments: monitor O2, delirium Restrictions Weight Bearing Restrictions: No              ADL either performed or assessed with clinical judgement   ADL Overall ADL's : Needs assistance/impaired Eating/Feeding: Moderate assistance;Bed level Eating/Feeding Details (indicate cue type and reason): Pt would do okay for several bites and then she would drop food and get distraught needing encouragement to keep trying. Pt feed herself ~25% of her meal with setup/S and the rest fed to her Grooming: Set up;Supervision/safety;Bed level;Wash/dry face;Wash/dry hands                                       Vision Patient Visual Report: No change from baseline            Cognition Arousal/Alertness: Awake/alert Behavior During Therapy: Flat affect Overall Cognitive Status: History of cognitive impairments - at baseline Area of Impairment: Orientation;Attention;Following commands;Problem solving                  Orientation Level: Disoriented to;Place;Situation (did know it was March with increased time to answer) Current Attention Level: Sustained   Following Commands: Follows one step commands with increased time     Problem Solving: Slow processing;Decreased initiation;Difficulty sequencing;Requires verbal cues;Requires tactile cues          Exercises Other Exercises Other Exercises: Had her work on Bil UE movement post self feeding by taking a washcloth and taping into to a ball and working on her catching and tossing/throwing it back to me. She only attempted to catch it 3 out of 14 times but she did attempt to throw/toss it back to me everytime--then she said she was done.           Pertinent Vitals/ Pain       Pain Assessment: Faces Faces Pain Scale: No hurt         Frequency  Min 2X/week        Progress Toward Goals  OT Goals(current goals can now be found in the care plan section)  Progress towards OT goals: Progressing toward goals  Acute Rehab OT Goals Patient Stated Goal: unable to state OT Goal Formulation: Patient unable to participate in goal setting Time For Goal Achievement: 07/21/20 Potential to Achieve Goals: Fair  Plan Discharge plan remains appropriate       AM-PAC OT "6 Clicks" Daily Activity     Outcome Measure   Help from another person eating meals?: A Lot Help  from another person taking care of personal grooming?: A Little Help from another person toileting, which includes using toliet, bedpan, or urinal?: Total Help from another person bathing (including washing, rinsing, drying)?: Total Help from another person to put on and taking off regular upper body clothing?: Total Help from another person to put on and taking off regular lower body clothing?: Total 6 Click Score: 9    End of Session Equipment Utilized During Treatment: Oxygen  OT Visit Diagnosis: Other abnormalities of gait and mobility (R26.89);Muscle weakness  (generalized) (M62.81);Other symptoms and signs involving cognitive function   Activity Tolerance Patient tolerated treatment well   Patient Left in bed;with call bell/phone within reach;with bed alarm set (in chair position)           Time: 9449-6759 OT Time Calculation (min): 37 min  Charges: OT General Charges $OT Visit: 1 Visit OT Treatments $Self Care/Home Management : 8-22 mins $Therapeutic Activity: 8-22 mins  Ignacia Palma, OTR/L Acute [employer] Pager [phone] Office [phone]      Evette Georges 07/17/2020, 3:48 PM

## 2020-07-17 NOTE — Progress Notes (Addendum)
PROGRESS NOTE  Stephanie Rice:[id number] DOB: 06-Mar-1956   PCP: [name], PA-C  Patient is from: SNF  DOA: 06/30/2020 LOS: 17  Chief complaints: Altered mental status  Brief Narrative / Interim history: 65 year old F with PMH of dementia, intellectual disability, COPD/chronic RF on trilogy vent and 2 L oxygen as needed, recent DVT, morbid obesity, IDDM-2, bipolar disorder and HTN brought to ED from SNF with altered mental status and admitted for acute metabolic encephalopathy in the setting of hypercapnia from acute on chronic respiratory failure and COPD exacerbation, and urinary tract infection.  Treated with steroid, antibiotics and BiPAP with improvement in ABG but waxing and waning mental status and some concern about behavioral issue.  Psychiatry consulted and gave recommendation.  Hospital course also complicated by acute thrombocytopenia and acute urinary retention.  Hematology consulted for thrombocytopenia.  Psychiatry also adjusted psych medications which could potentially contribute to thrombocytopenia.  She has indwelling Foley catheter for acute urinary retention   Subjective: Patient seen and examined.  Every day now she is getting better, more awake and alert and oriented x1.  No complaints.  More talkative today.  Objective: Vitals:   07/16/20 1405 07/16/20 2152 07/17/20 0845 07/17/20 1100  BP: (!) 146/56 (!) 133/43  (!) 163/67  Pulse: 68 74    Resp: 20 20    Temp:  98.4 F (36.9 C)  98.3 F (36.8 C)  TempSrc:  Oral  Oral  SpO2: 96% 100% 96%   Weight:      Height:       No intake or output data in the 24 hours ending 07/17/20 1326 Filed Weights   07/07/20 1731  Weight: 104.9 kg    Examination:  General exam: Appears calm and comfortable  Respiratory system: Diminished breath sounds bilaterally. Respiratory effort normal. Cardiovascular system: S1 & S2 heard, RRR. No JVD, murmurs, rubs, gallops or clicks.  +1 pitting edema bilateral lower  extremity. Gastrointestinal system: Abdomen is nondistended, soft and nontender. No organomegaly or masses felt. Normal bowel sounds heard. Central nervous system: Alert and oriented. No focal neurological deficits. Extremities: Symmetric 5 x 5 power. Skin: No rashes, lesions or ulcers.  Psychiatry: Judgement and insight appear poor.  Procedures:  None  Microbiology summarized: COVID-19 and influenza PCR nonreactive.  Assessment & Plan: Acute metabolic encephalopathy/delirium-multifactorial including hypercapnia, UTI, CAP, underlying dementia, bipolar disorder and intellectual disability.  Reportedly waxing or waning suggesting delirium.  She was also on steroids for COPD exacerbation which could have contributed.   CT head, RPR, B12 and TSH without acute finding. Per daughter, she always say no to any question.  She is sleepy but arousable. -Treat treatable causes -Reorientation and delirium precautions -Minimize sedating meds. -Minimal oxygen to keep sat above 88%. -Psychiatry decreased Depakote to 500 mg twice daily in the setting of acute thrombocytopenia.  Patient often remains very sleepy.  CT and MRI brain unremarkable her Depakote level is within normal range.  Acute on chronic hypoxic and hypercapnic respiratory failure due to COPD exacerbation and CAP -Patient is on trilogy vent and oxygen 2 L as needed at baseline -ABG improved with BiPAP.  Minimal oxygen to keep saturation above 88%.  -Completed 5 days of doxycycline and 3 days of ceftriaxone -Completed course of systemic steroid -Apparently, SNF does not take patients with trilogy vent.  So BiPAP on discharge.  Currently on 6 L.  Wean as able to.  Atrial fibrillation with RVR: RVR resolved.  TTE without significant finding. -Changed metoprolol to Cardizem  given underlying COPD -Continue Eliquis-was on this for DVT -Discontinued as needed hydralazine  Dysphagia: -Dysphagia 2 diet per SLP. -Continue aspiration  precautions  UTI-completed antibiotic course with ceftriaxone and Keflex  Oropharyngeal dysphagia -SLP recommended dysphagia 2 diet  Mild hyperkalemia -Corrected with Lokelma.    Uncontrolled hypertension: Improved. -Continue clonidine 0.2 mg twice daily -Continue Cardizem CD 180 mg daily -Discontinued as needed hydralazine  Uncontrolled IDDM-2 with hyperglycemia and neuropathy: A1c 7.2%. Recent Labs  Lab 07/16/20 1130 07/16/20 1708 07/16/20 2022 07/17/20 0827 07/17/20 1154  GLUCAP 235* 136* 143* 180* 215*  -Slightly hyperglycemic.  Increase Levemir to 30 units and continue SSI  History of bipolar disorder -Psychiatry decreased Depakote to 500 mg twice daily (home dose) in the setting of thrombocytopenia. -Patient is also on loxapine 20 mg daily.  Since she is on 10 mg daily SNF.   History of DVT -Continue Eliquis.  Acute urinary retention: Continue indwelling Foley catheter -Continue p.o. bethanechol -Renal ultrasound unremarkable.  Needs to mobilize in order to have Foley removed but she is not moving at all.  Continue Flomax.  Foley has been removed and per nursing, she has been voiding without any trouble.  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  Also on psych medications which could potentially contribute.  Recent Labs  Lab 07/11/20 1312 07/12/20 0124 07/13/20 0345 07/14/20 0551 07/15/20 1302 07/16/20 0206 07/17/20 0847  PLT 93* 91* 88* 77* 75* 91* 96*  -Normal platelet bicitrate.  Platelets improving.  Hematology signed off.  May follow-up as outpatient with hematology if needed   Morbid obesity Body mass index is 38.48 kg/m. Nutrition Problem: Increased nutrient needs Etiology: acute illness Signs/Symptoms: estimated needs Interventions: Refer to RD note for recommendations   DVT prophylaxis:   apixaban (ELIQUIS) tablet 5 mg  Code Status: DNR/DNI Family Communication:  None at bedside.  Level of care: Telemetry Medical Status is: Inpatient  Remains inpatient appropriate because: Unsafe DC plan  Dispo: The patient is from: SNF, insurance declined SNF.  Did peer to peer with insurance company 07/13/2020.  Daughter appealing SNF declination.  Waiting for decision              Anticipated d/c is to: SNF vs home with home health              Anticipated d/c date is: 1 to 2 days              Patient currently is  medically stable to d/c.   Difficult to place patient No       Consultants:  Psychiatry Hematology-following   Sch Meds:  Scheduled Meds: . apixaban  5 mg Oral BID  . atorvastatin  20 mg Oral QHS  . bethanechol  25 mg Oral TID  . Chlorhexidine Gluconate Cloth  6 each Topical Daily  . cloNIDine  0.2 mg Oral BID  . diltiazem  180 mg Oral Daily  . divalproex  500 mg Oral Q12H  . fluticasone furoate-vilanterol  1 puff Inhalation Daily  . insulin aspart  0-20 Units Subcutaneous TID WC  . insulin aspart  0-5 Units Subcutaneous QHS  . insulin detemir  25 Units Subcutaneous Daily  . loxapine  20 mg Oral QHS  . melatonin  5 mg Oral QHS  . pantoprazole  40 mg Oral Daily  . polyvinyl alcohol  1 drop Both Eyes Q breakfast  . tamsulosin  0.4 mg Oral QPC breakfast   Continuous Infusions:  PRN Meds:.acetaminophen, albuterol, clonazepam, diltiazem, polyethylene glycol, Resource ThickenUp Clear, senna-docusate, traMADol  Antimicrobials: Anti-infectives (From admission, onward)   Start     Dose/Rate Route Frequency Ordered Stop   07/03/20 1400  cephALEXin (KEFLEX) capsule 500 mg        500 mg Oral Every 8 hours 07/03/20 0954 07/07/20 1146   06/30/20 2200  doxycycline (VIBRA-TABS) tablet 100 mg        100 mg Oral Every 12 hours 06/30/20 1612 07/05/20 2159   06/30/20 2030  cefTRIAXone (ROCEPHIN) 2 g in sodium chloride 0.9 % 100 mL IVPB  Status:  Discontinued        2 g 200 mL/hr over 30 Minutes Intravenous Daily at bedtime 06/30/20 2006 07/03/20  0954   06/30/20 1800  Ampicillin-Sulbactam (UNASYN) 3 g in sodium chloride 0.9 % 100 mL IVPB  Status:  Discontinued        3 g 200 mL/hr over 30 Minutes Intravenous Every 6 hours 06/30/20 1703 06/30/20 1954       I have personally reviewed the following labs and images: CBC: Recent Labs  Lab 07/13/20 0345 07/14/20 0551 07/15/20 1302 07/16/20 0206 07/17/20 0847  WBC 3.8* 3.9* 3.9* 3.6* 3.3*  NEUTROABS 2.1 2.3 2.1 1.8 1.8  HGB 11.3* 10.8* 12.2 10.3* 10.9*  HCT 36.3 35.6* 38.5 32.4* 33.6*  MCV 96.5 99.4 98.0 97.3 96.6  PLT 88* 77* 75* 91* 96*   BMP &GFR Recent Labs  Lab 07/14/20 0551 07/16/20 0206 07/17/20 0257  NA 140 141 140  K 3.9 4.0 3.5  CL 91* 94* 93*  CO2 39* 37* 35*  GLUCOSE 172* 197* 262*  BUN 26* 16 15  CREATININE 0.48 0.43* 0.33*  CALCIUM 8.4* 8.5* 8.5*   Estimated Creatinine Clearance: 82.1 mL/min (A) (by C-G formula based on SCr of 0.33 mg/dL (L)). Liver & Pancreas: No results for input(s): AST, ALT, ALKPHOS, BILITOT, PROT, ALBUMIN in the last 168 hours. No results for input(s): LIPASE, AMYLASE in the last 168 hours. No results for input(s): AMMONIA in the last 168 hours. Diabetic: No results for input(s): HGBA1C in the last 72 hours. Recent Labs  Lab 07/16/20 1130 07/16/20 1708 07/16/20 2022 07/17/20 0827 07/17/20 1154  GLUCAP 235* 136* 143* 180* 215*   Cardiac Enzymes: No results for input(s): CKTOTAL, CKMB, CKMBINDEX, TROPONINI in the last 168 hours. No results for input(s): PROBNP in the last 8760 hours. Coagulation Profile: No results for input(s): INR, PROTIME in the last 168 hours. Thyroid Function Tests: No results for input(s): TSH, T4TOTAL, FREET4, T3FREE, THYROIDAB in the last 72 hours. Lipid Profile: No results for input(s): CHOL, HDL, LDLCALC, TRIG, CHOLHDL, LDLDIRECT in the last 72 hours. Anemia Panel: No results for input(s): VITAMINB12, FOLATE, FERRITIN, TIBC, IRON, RETICCTPCT in the last 72 hours. Urine analysis:     Component Value Date/Time   COLORURINE YELLOW 06/30/2020 1211   APPEARANCEUR TURBID (A) 06/30/2020 1211   LABSPEC 1.023 06/30/2020 1211   PHURINE 5.0 06/30/2020 1211   GLUCOSEU NEGATIVE 06/30/2020 1211   HGBUR NEGATIVE 06/30/2020 1211   BILIRUBINUR NEGATIVE 06/30/2020 1211   KETONESUR 5 (A) 06/30/2020 1211   PROTEINUR 100 (A) 06/30/2020 1211   NITRITE POSITIVE (A) 06/30/2020 1211   LEUKOCYTESUR LARGE (A) 06/30/2020 1211   Sepsis Labs: Invalid input(s): PROCALCITONIN, LACTICIDVEN  Microbiology: Recent Results (from the past 240 hour(s))  SARS CORONAVIRUS 2 (TAT 6-24 HRS) Nasopharyngeal Nasopharyngeal Swab     Status: None   Collection Time: 07/09/20  3:06 PM  Specimen: Nasopharyngeal Swab  Result Value Ref Range Status   SARS Coronavirus 2 NEGATIVE NEGATIVE Final    Comment: (NOTE) SARS-CoV-2 target nucleic acids are NOT DETECTED.  The SARS-CoV-2 RNA is generally detectable in upper and lower respiratory specimens during the acute phase of infection. Negative results do not preclude SARS-CoV-2 infection, do not rule out co-infections with other pathogens, and should not be used as the sole basis for treatment or other patient management decisions. Negative results must be combined with clinical observations, patient history, and epidemiological information. The expected result is Negative.  Fact Sheet for Patients: HairSlick.nohttps://www.fda.gov/media/138098/download  Fact Sheet for Healthcare Providers: quierodirigir.comhttps://www.fda.gov/media/138095/download  This test is not yet approved or cleared by the [place] FDA and  has been authorized for detection and/or diagnosis of SARS-CoV-2 by FDA under an Emergency Use Authorization (EUA). This EUA will remain  in effect (meaning this test can be used) for the duration of the COVID-19 declaration under Se ction 564(b)(1) of the Act, 21 U.S.C. section 360bbb-3(b)(1), unless the authorization is terminated or revoked sooner.  Performed at  [facility], [address] [place], [place] [address]   Urine Culture     Status: Abnormal   Collection Time: 07/11/20  4:14 PM   Specimen: Urine, Catheterized  Result Value Ref Range Status   Specimen Description URINE, CATHETERIZED  Final   Special Requests   Final    NONE Performed at [facility], [address] [place], [place] [address]    Culture >=100,000 COLONIES/mL ENTEROCOCCUS FAECALIS (A)  Final   Report Status 07/15/2020 FINAL  Final   Organism ID, Bacteria ENTEROCOCCUS FAECALIS (A)  Final      Susceptibility   Enterococcus faecalis - MIC*    AMPICILLIN <=2 SENSITIVE Sensitive     NITROFURANTOIN <=16 SENSITIVE Sensitive     VANCOMYCIN 1 SENSITIVE Sensitive     * >=100,000 COLONIES/mL ENTEROCOCCUS FAECALIS    Radiology Studies: No results found.   Hughie Clossavi Rece Zechman, MD [facility]  If 7PM-7AM, please contact night-coverage [email or web address] 07/17/2020, 1:26 PM

## 2020-07-17 NOTE — TOC Progression Note (Addendum)
Transition of Care [facility]) - Progression Note    Patient Details  Name: Stephanie Rice MRN: [id number] Date of Birth: Sep 28, 1955  [facility] CM/SW Contact  [name], LCSW Phone Number: 07/17/2020, 1:39 PM  Clinical Narrative:  CSW called [employer] for update on SNF authorization appeal.  Representative said they have until 3pm to meet 72 hour deadline and no decision has been made.    CSW spoke with Felicia at [facility] regarding time frame on a hoyer lift delivery--they just delivered their last hoyer lift today.  No more in stock and unsure on when they will have another.  CSW spoke with Silvio Pate at [employer] regarding their ability to deliver hoyer lift to [place] after 3pm.  They may or may not be able to deliver one today, but latest delivery would be tomorrow and they can get it to [place].    CSW spoke to daughter Joyce Gross, who has not heard from [facility] about appeal either.  Joyce Gross is asking for referral to a dietician.  MD notified.  Discussed plan for SNF if appeal is successful and for home if not, probably tomorrow.  Informed her that [facility] does not have hoyer lift but [employer] does.  She is aware she has no insurance option for a new bed.    1530: CSW called [employer] again, after long hold, was informed that they are waiting for a form from pt daughter, appointment of representative form.  There is no decision on the appeal because they do not have the form.  CSW called daughter, who was not aware of this and will work on submitting today.     Expected Discharge Plan: Skilled Nursing Facility Barriers to Discharge: Continued Medical Work up  Expected Discharge Plan and Services Expected Discharge Plan: Skilled Nursing Facility     Post Acute Care Choice:  (daughter requesting LTAC) Living arrangements for the past 2 months: Skilled Nursing Facility (was hospitalized at [place] for 30+ days prior to SNF)                           HH Arranged: PT,OT,Nurse's Aide HH  Agency: [facility] Date [facility] Agency Contacted: 07/14/20 Time HH Agency Contacted: 1300 Representative spoke with at [facility] Agency: Kandee Keen   Social Determinants of Health (SDOH) Interventions    Readmission Risk Interventions No flowsheet data found.

## 2020-07-18 DIAGNOSIS — J441 Chronic obstructive pulmonary disease with (acute) exacerbation: Secondary | ICD-10-CM | POA: Diagnosis not present

## 2020-07-18 LAB — CBC WITH DIFFERENTIAL/PLATELET
Abs Immature Granulocytes: 0.04 10*3/uL (ref 0.00–0.07)
Basophils Absolute: 0 10*3/uL (ref 0.0–0.1)
Basophils Relative: 0 %
Eosinophils Absolute: 0 10*3/uL (ref 0.0–0.5)
Eosinophils Relative: 1 %
HCT: 34.3 % — ABNORMAL LOW (ref 36.0–46.0)
Hemoglobin: 11.3 g/dL — ABNORMAL LOW (ref 12.0–15.0)
Immature Granulocytes: 1 %
Lymphocytes Relative: 16 %
Lymphs Abs: 0.8 10*3/uL (ref 0.7–4.0)
MCH: 31 pg (ref 26.0–34.0)
MCHC: 32.9 g/dL (ref 30.0–36.0)
MCV: 94.2 fL (ref 80.0–100.0)
Monocytes Absolute: 0.7 10*3/uL (ref 0.1–1.0)
Monocytes Relative: 14 %
Neutro Abs: 3.6 10*3/uL (ref 1.7–7.7)
Neutrophils Relative %: 68 %
Platelets: 106 10*3/uL — ABNORMAL LOW (ref 150–400)
RBC: 3.64 MIL/uL — ABNORMAL LOW (ref 3.87–5.11)
RDW: 13.6 % (ref 11.5–15.5)
WBC: 5.2 10*3/uL (ref 4.0–10.5)
nRBC: 0 % (ref 0.0–0.2)

## 2020-07-18 LAB — GLUCOSE, CAPILLARY
Glucose-Capillary: 159 mg/dL — ABNORMAL HIGH (ref 70–99)
Glucose-Capillary: 181 mg/dL — ABNORMAL HIGH (ref 70–99)
Glucose-Capillary: 210 mg/dL — ABNORMAL HIGH (ref 70–99)
Glucose-Capillary: 215 mg/dL — ABNORMAL HIGH (ref 70–99)
Glucose-Capillary: 65 mg/dL — ABNORMAL LOW (ref 70–99)
Glucose-Capillary: 68 mg/dL — ABNORMAL LOW (ref 70–99)
Glucose-Capillary: 86 mg/dL (ref 70–99)

## 2020-07-18 LAB — VITAMIN B1: Vitamin B1 (Thiamine): 106.8 nmol/L (ref 66.5–200.0)

## 2020-07-18 NOTE — TOC Progression Note (Signed)
Transition of Care [facility]) - Progression Note    Patient Details  Name: Stephanie Rice MRN: [id number] Date of Birth: [birth year]  [facility] CM/SW Contact  Carley Hammed, [place] Phone Number: 07/18/2020, 3:51 PM  Clinical Narrative:    CSW followed up with [facility] to discuss the appeal. They noted that they had everything they needed and were waiting for it to finalize. SW will continue to follow for DC planning.   Expected Discharge Plan: Skilled Nursing Facility Barriers to Discharge: Continued Medical Work up  Expected Discharge Plan and Services Expected Discharge Plan: Skilled Nursing Facility     Post Acute Care Choice:  (daughter requesting LTAC) Living arrangements for the past 2 months: Skilled Nursing Facility (was hospitalized at [place] for 30+ days prior to SNF)                           HH Arranged: PT,OT,Nurse's Aide HH Agency: [facility] Date [facility] Agency Contacted: 07/14/20 Time HH Agency Contacted: 1300 Representative spoke with at [facility] Agency: Kandee Keen   Social Determinants of Health (SDOH) Interventions    Readmission Risk Interventions No flowsheet data found.

## 2020-07-18 NOTE — Progress Notes (Signed)
PROGRESS NOTE  Stephanie RobertDeborah Rice ZOX:[id number] DOB: 01/19/1956   PCP: Coralee Ruduran, Michael R, PA-C  Patient is from: SNF  DOA: 06/30/2020 LOS: 18  Chief complaints: Altered mental status  Brief Narrative / Interim history: 65 year old F with PMH of dementia, intellectual disability, COPD/chronic RF on trilogy vent and 2 L oxygen as needed, recent DVT, morbid obesity, IDDM-2, bipolar disorder and HTN brought to ED from SNF with altered mental status and admitted for acute metabolic encephalopathy in the setting of hypercapnia from acute on chronic respiratory failure and COPD exacerbation, and urinary tract infection.  Treated with steroid, antibiotics and BiPAP with improvement in ABG but waxing and waning mental status and some concern about behavioral issue.  Psychiatry consulted and gave recommendation.  Hospital course also complicated by acute thrombocytopenia and acute urinary retention.  Hematology consulted for thrombocytopenia.  Psychiatry also adjusted psych medications which could potentially contribute to thrombocytopenia.  She has indwelling Foley catheter for acute urinary retention   Subjective: Seen and examined.  Primary nurse at the bedside.  Patient alert and very feisty today.  Would not like to talk to me or anyone else and would not answer my questions.  Objective: Vitals:   07/18/20 0110 07/18/20 0136 07/18/20 0356 07/18/20 0528  BP:  112/78  (!) 104/56  Pulse: (!) 102 (!) 104    Resp:      Temp:    98.2 F (36.8 C)  TempSrc:    Oral  SpO2: 98% 100% (!) 73%   Weight:      Height:        Intake/Output Summary (Last 24 hours) at 07/18/2020 1202 Last data filed at 07/17/2020 1346 Gross per 24 hour  Intake --  Output 400 ml  Net -400 ml   Filed Weights   07/07/20 1731  Weight: 104.9 kg    Examination:  General exam: Appears calm and comfortable  Respiratory system: Clear to auscultation. Respiratory effort normal. Cardiovascular system: S1 & S2 heard, RRR. No  JVD, murmurs, rubs, gallops or clicks. No pedal edema. Gastrointestinal system: Abdomen is nondistended, soft and nontender. No organomegaly or masses felt. Normal bowel sounds heard. Central nervous system: Alert but unable to assess orientation. No focal neurological deficits. Extremities: Symmetric 5 x 5 power. Skin: No rashes, lesions or ulcers.  Psychiatry: Judgement and insight appear poor Procedures:  None  Microbiology summarized: COVID-19 and influenza PCR nonreactive.  Assessment & Plan: Acute metabolic encephalopathy/delirium-multifactorial including hypercapnia, UTI, CAP, underlying dementia, bipolar disorder and intellectual disability.  Reportedly waxing or waning suggesting delirium.  She was also on steroids for COPD exacerbation which could have contributed.   CT head, RPR, B12 and TSH without acute finding. Per daughter, she always say no to any question.  She is sleepy but arousable. -Treat treatable causes -Reorientation and delirium precautions -Minimize sedating meds. -Minimal oxygen to keep sat above 88%. -Psychiatry decreased Depakote to 500 mg twice daily in the setting of acute thrombocytopenia.  Patient often remains very sleepy.  CT and MRI brain unremarkable her Depakote level is within normal range.  Acute on chronic hypoxic and hypercapnic respiratory failure due to COPD exacerbation and CAP -Patient is on trilogy vent and oxygen 2 L as needed at baseline -ABG improved with BiPAP.  Minimal oxygen to keep saturation above 88%.  -Completed 5 days of doxycycline and 3 days of ceftriaxone -Completed course of systemic steroid -Apparently, SNF does not take patients with trilogy vent.  So BiPAP on discharge.  Currently on 6  L.  Wean as able to.  Atrial fibrillation with RVR: RVR resolved.  TTE without significant finding. -Changed metoprolol to Cardizem given underlying COPD -Continue Eliquis-was on this for DVT -Discontinued as needed  hydralazine  Dysphagia: -Dysphagia 2 diet per SLP. -Continue aspiration precautions  UTI-completed antibiotic course with ceftriaxone and Keflex  Oropharyngeal dysphagia -SLP recommended dysphagia 2 diet  Mild hyperkalemia -Corrected with Lokelma.    Uncontrolled hypertension: Improved. -Continue clonidine 0.2 mg twice daily -Continue Cardizem CD 180 mg daily -Discontinued as needed hydralazine  Uncontrolled IDDM-2 with hyperglycemia and neuropathy: A1c 7.2%. Recent Labs  Lab 07/17/20 1536 07/17/20 1958 07/17/20 2359 07/18/20 0738 07/18/20 1125  GLUCAP 210* 193* 210* 181* 215*  -Slightly hyperglycemic.  Increase Levemir to 30 units and continue SSI  History of bipolar disorder -Psychiatry decreased Depakote to 500 mg twice daily (home dose) in the setting of thrombocytopenia. -Patient is also on loxapine 20 mg daily.  Since she is on 10 mg daily SNF.   History of DVT -Continue Eliquis.  Acute urinary retention: Continue indwelling Foley catheter -Continue p.o. bethanechol -Renal ultrasound unremarkable.  Needs to mobilize in order to have Foley removed but she is not moving at all.  Continue Flomax.  Foley has been removed and per nursing, she has been voiding without any trouble.  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  Also on psych medications which could potentially contribute.  Recent Labs  Lab 07/11/20 1312 07/12/20 0124 07/13/20 0345 07/14/20 0551 07/15/20 1302 07/16/20 0206 07/17/20 0847 07/18/20 0132  PLT 93* 91* 88* 77* 75* 91* 96* 106*  -Normal platelet bicitrate.  Platelets improving.  Hematology signed off.  May follow-up as outpatient with hematology if needed   Morbid obesity Body mass index is 38.48 kg/m. Nutrition Problem: Increased nutrient needs Etiology: acute illness Signs/Symptoms: estimated needs Interventions: Refer to RD note for recommendations    DVT prophylaxis:   apixaban (ELIQUIS) tablet 5 mg  Code Status: DNR/DNI Family Communication: None at bedside.  Level of care: Telemetry Medical Status is: Inpatient  Remains inpatient appropriate because: Waiting for insurance decision for SNF  Dispo: The patient is from: SNF, insurance declined SNF.  Did peer to peer with insurance company 07/13/2020.  Daughter appealing SNF declination.  Waiting for decision              Anticipated d/c is to: SNF vs home with home health              Anticipated d/c date is: 1 to 2 days              Patient currently is  medically stable to d/c.   Difficult to place patient No       Consultants:  Psychiatry Hematology-following   Sch Meds:  Scheduled Meds: . apixaban  5 mg Oral BID  . atorvastatin  20 mg Oral QHS  . bethanechol  25 mg Oral TID  . Chlorhexidine Gluconate Cloth  6 each Topical Daily  . cloNIDine  0.2 mg Oral BID  . diltiazem  180 mg Oral Daily  . divalproex  500 mg Oral Q12H  . fluticasone furoate-vilanterol  1 puff Inhalation Daily  . insulin aspart  0-20 Units Subcutaneous TID WC  . insulin aspart  0-5 Units Subcutaneous QHS  . insulin detemir  30 Units Subcutaneous Daily  . loxapine  20 mg Oral QHS  . melatonin  5 mg Oral QHS  . pantoprazole  40 mg Oral Daily  . polyvinyl alcohol  1 drop Both Eyes Q breakfast  . tamsulosin  0.4 mg Oral QPC breakfast   Continuous Infusions: PRN Meds:.acetaminophen, albuterol, clonazepam, diltiazem, polyethylene glycol, Resource ThickenUp Clear, senna-docusate, traMADol  Antimicrobials: Anti-infectives (From admission, onward)   Start     Dose/Rate Route Frequency Ordered Stop   07/03/20 1400  cephALEXin (KEFLEX) capsule 500 mg        500 mg Oral Every 8 hours 07/03/20 0954 07/07/20 1146   06/30/20 2200  doxycycline (VIBRA-TABS) tablet 100 mg        100 mg Oral Every 12 hours 06/30/20 1612 07/05/20 2159   06/30/20 2030  cefTRIAXone (ROCEPHIN) 2 g in sodium chloride 0.9 % 100  mL IVPB  Status:  Discontinued        2 g 200 mL/hr over 30 Minutes Intravenous Daily at bedtime 06/30/20 2006 07/03/20 0954   06/30/20 1800  Ampicillin-Sulbactam (UNASYN) 3 g in sodium chloride 0.9 % 100 mL IVPB  Status:  Discontinued        3 g 200 mL/hr over 30 Minutes Intravenous Every 6 hours 06/30/20 1703 06/30/20 1954       I have personally reviewed the following labs and images: CBC: Recent Labs  Lab 07/14/20 0551 07/15/20 1302 07/16/20 0206 07/17/20 0847 07/18/20 0132  WBC 3.9* 3.9* 3.6* 3.3* 5.2  NEUTROABS 2.3 2.1 1.8 1.8 3.6  HGB 10.8* 12.2 10.3* 10.9* 11.3*  HCT 35.6* 38.5 32.4* 33.6* 34.3*  MCV 99.4 98.0 97.3 96.6 94.2  PLT 77* 75* 91* 96* 106*   BMP &GFR Recent Labs  Lab 07/14/20 0551 07/16/20 0206 07/17/20 0257  NA 140 141 140  K 3.9 4.0 3.5  CL 91* 94* 93*  CO2 39* 37* 35*  GLUCOSE 172* 197* 262*  BUN 26* 16 15  CREATININE 0.48 0.43* 0.33*  CALCIUM 8.4* 8.5* 8.5*   Estimated Creatinine Clearance: 82.1 mL/min (A) (by C-G formula based on SCr of 0.33 mg/dL (L)). Liver & Pancreas: No results for input(s): AST, ALT, ALKPHOS, BILITOT, PROT, ALBUMIN in the last 168 hours. No results for input(s): LIPASE, AMYLASE in the last 168 hours. No results for input(s): AMMONIA in the last 168 hours. Diabetic: No results for input(s): HGBA1C in the last 72 hours. Recent Labs  Lab 07/17/20 1536 07/17/20 1958 07/17/20 2359 07/18/20 0738 07/18/20 1125  GLUCAP 210* 193* 210* 181* 215*   Cardiac Enzymes: No results for input(s): CKTOTAL, CKMB, CKMBINDEX, TROPONINI in the last 168 hours. No results for input(s): PROBNP in the last 8760 hours. Coagulation Profile: No results for input(s): INR, PROTIME in the last 168 hours. Thyroid Function Tests: No results for input(s): TSH, T4TOTAL, FREET4, T3FREE, THYROIDAB in the last 72 hours. Lipid Profile: No results for input(s): CHOL, HDL, LDLCALC, TRIG, CHOLHDL, LDLDIRECT in the last 72 hours. Anemia Panel: No  results for input(s): VITAMINB12, FOLATE, FERRITIN, TIBC, IRON, RETICCTPCT in the last 72 hours. Urine analysis:    Component Value Date/Time   COLORURINE YELLOW 06/30/2020 1211   APPEARANCEUR TURBID (A) 06/30/2020 1211   LABSPEC 1.023 06/30/2020 1211   PHURINE 5.0 06/30/2020 1211   GLUCOSEU NEGATIVE 06/30/2020 1211   HGBUR NEGATIVE 06/30/2020 1211   BILIRUBINUR NEGATIVE 06/30/2020 1211   KETONESUR 5 (A) 06/30/2020 1211   PROTEINUR 100 (A) 06/30/2020 1211   NITRITE POSITIVE (A) 06/30/2020 1211   LEUKOCYTESUR LARGE (A) 06/30/2020 1211   Sepsis Labs: Invalid input(s): PROCALCITONIN, LACTICIDVEN  Microbiology: Recent Results (from the past  240 hour(s))  SARS CORONAVIRUS 2 (TAT 6-24 HRS) Nasopharyngeal Nasopharyngeal Swab     Status: None   Collection Time: 07/09/20  3:06 PM   Specimen: Nasopharyngeal Swab  Result Value Ref Range Status   SARS Coronavirus 2 NEGATIVE NEGATIVE Final    Comment: (NOTE) SARS-CoV-2 target nucleic acids are NOT DETECTED.  The SARS-CoV-2 RNA is generally detectable in upper and lower respiratory specimens during the acute phase of infection. Negative results do not preclude SARS-CoV-2 infection, do not rule out co-infections with other pathogens, and should not be used as the sole basis for treatment or other patient management decisions. Negative results must be combined with clinical observations, patient history, and epidemiological information. The expected result is Negative.  Fact Sheet for Patients: HairSlick.no  Fact Sheet for Healthcare Providers: quierodirigir.com  This test is not yet approved or cleared by the [place] FDA and  has been authorized for detection and/or diagnosis of SARS-CoV-2 by FDA under an Emergency Use Authorization (EUA). This EUA will remain  in effect (meaning this test can be used) for the duration of the COVID-19 declaration under Se ction 564(b)(1) of  the Act, 21 U.S.C. section 360bbb-3(b)(1), unless the authorization is terminated or revoked sooner.  Performed at [facility], [address], [place], [place] [address]   Urine Culture     Status: Abnormal   Collection Time: 07/11/20  4:14 PM   Specimen: Urine, Catheterized  Result Value Ref Range Status   Specimen Description URINE, CATHETERIZED  Final   Special Requests   Final    NONE Performed at [facility], [address] [place], [place] [address]    Culture >=100,000 COLONIES/mL ENTEROCOCCUS FAECALIS (A)  Final   Report Status 07/15/2020 FINAL  Final   Organism ID, Bacteria ENTEROCOCCUS FAECALIS (A)  Final      Susceptibility   Enterococcus faecalis - MIC*    AMPICILLIN <=2 SENSITIVE Sensitive     NITROFURANTOIN <=16 SENSITIVE Sensitive     VANCOMYCIN 1 SENSITIVE Sensitive     * >=100,000 COLONIES/mL ENTEROCOCCUS FAECALIS    Radiology Studies: No results found.   Hughie Closs, MD [facility]  If 7PM-7AM, please contact night-coverage [email or web address] 07/18/2020, 12:02 PM

## 2020-07-19 DIAGNOSIS — J441 Chronic obstructive pulmonary disease with (acute) exacerbation: Secondary | ICD-10-CM | POA: Diagnosis not present

## 2020-07-19 LAB — CBC WITH DIFFERENTIAL/PLATELET
Abs Immature Granulocytes: 0.05 10*3/uL (ref 0.00–0.07)
Basophils Absolute: 0 10*3/uL (ref 0.0–0.1)
Basophils Relative: 0 %
Eosinophils Absolute: 0 10*3/uL (ref 0.0–0.5)
Eosinophils Relative: 0 %
HCT: 37.7 % (ref 36.0–46.0)
Hemoglobin: 12.1 g/dL (ref 12.0–15.0)
Immature Granulocytes: 1 %
Lymphocytes Relative: 17 %
Lymphs Abs: 1.3 10*3/uL (ref 0.7–4.0)
MCH: 30.6 pg (ref 26.0–34.0)
MCHC: 32.1 g/dL (ref 30.0–36.0)
MCV: 95.4 fL (ref 80.0–100.0)
Monocytes Absolute: 1.1 10*3/uL — ABNORMAL HIGH (ref 0.1–1.0)
Monocytes Relative: 14 %
Neutro Abs: 5.3 10*3/uL (ref 1.7–7.7)
Neutrophils Relative %: 68 %
Platelets: 138 10*3/uL — ABNORMAL LOW (ref 150–400)
RBC: 3.95 MIL/uL (ref 3.87–5.11)
RDW: 14.2 % (ref 11.5–15.5)
WBC: 7.8 10*3/uL (ref 4.0–10.5)
nRBC: 0 % (ref 0.0–0.2)

## 2020-07-19 LAB — GLUCOSE, CAPILLARY
Glucose-Capillary: 207 mg/dL — ABNORMAL HIGH (ref 70–99)
Glucose-Capillary: 220 mg/dL — ABNORMAL HIGH (ref 70–99)
Glucose-Capillary: 170 mg/dL — ABNORMAL HIGH (ref 70–99)
Glucose-Capillary: 52 mg/dL — ABNORMAL LOW (ref 70–99)
Glucose-Capillary: 63 mg/dL — ABNORMAL LOW (ref 70–99)

## 2020-07-19 NOTE — Plan of Care (Signed)
°  Problem: Clinical Measurements: °Goal: Ability to maintain clinical measurements within normal limits will improve °Outcome: Progressing °Goal: Will remain free from infection °Outcome: Progressing °Goal: Diagnostic test results will improve °Outcome: Progressing °Goal: Respiratory complications will improve °Outcome: Progressing °  °

## 2020-07-19 NOTE — Progress Notes (Signed)
PROGRESS NOTE  Charnice Zwilling MEQ:[id number] DOB: 01/05/1956   PCP: [name], PA-C  Patient is from: SNF  DOA: 06/30/2020 LOS: 19  Chief complaints: Altered mental status  Brief Narrative / Interim history: 65 year old F with PMH of dementia, intellectual disability, COPD/chronic RF on trilogy vent and 2 L oxygen as needed, recent DVT, morbid obesity, IDDM-2, bipolar disorder and HTN brought to ED from SNF with altered mental status and admitted for acute metabolic encephalopathy in the setting of hypercapnia from acute on chronic respiratory failure and COPD exacerbation, and urinary tract infection.  Treated with steroid, antibiotics and BiPAP with improvement in ABG but waxing and waning mental status and some concern about behavioral issue.  Psychiatry consulted and gave recommendation.  Hospital course also complicated by acute thrombocytopenia and acute urinary retention.  Hematology consulted for thrombocytopenia.  Psychiatry also adjusted psych medications which could potentially contribute to thrombocytopenia.  She has indwelling Foley catheter for acute urinary retention   Subjective: Patient seen and examined.  She is more alert today.  Oriented x1.  No complaints.  Objective: Vitals:   07/18/20 2054 07/18/20 2142 07/19/20 0200 07/19/20 0625  BP: (!) 90/43   (!) 99/43  Pulse: 92 95 99 88  Resp: (!) 22 (!) 22 17 18   Temp:    98.4 F (36.9 C)  TempSrc:    Axillary  SpO2: 99% 97% 92% 100%  Weight:      Height:        Intake/Output Summary (Last 24 hours) at 07/19/2020 1331 Last data filed at 07/18/2020 2129 Gross per 24 hour  Intake -  Output 500 ml  Net -500 ml   Filed Weights   07/07/20 1731  Weight: 104.9 kg    Examination:  General exam: Appears calm and comfortable, obese Respiratory system: Diminished breath sounds. Respiratory effort normal. Cardiovascular system: S1 & S2 heard, RRR. No JVD, murmurs, rubs, gallops or clicks. No pedal  edema. Gastrointestinal system: Abdomen is nondistended, soft and nontender. No organomegaly or masses felt. Normal bowel sounds heard. Central nervous system: Alert and oriented x1. No focal neurological deficits. Extremities: Symmetric 5 x 5 power. Skin: No rashes, lesions or ulcers.  Psychiatry: Judgement and insight appear poor Procedures:  None  Microbiology summarized: COVID-19 and influenza PCR nonreactive.  Assessment & Plan: Acute metabolic encephalopathy/delirium-multifactorial including hypercapnia, UTI, CAP, underlying dementia, bipolar disorder and intellectual disability.  Reportedly waxing or waning suggesting delirium.  She was also on steroids for COPD exacerbation which could have contributed.   CT head, RPR, B12 and TSH without acute finding. Per daughter, she always say no to any question.  She is sleepy but arousable. -Treat treatable causes -Reorientation and delirium precautions -Minimize sedating meds. -Minimal oxygen to keep sat above 88%. -Psychiatry decreased Depakote to 500 mg twice daily in the setting of acute thrombocytopenia.  Patient often remains very sleepy.  CT and MRI brain unremarkable her Depakote level is within normal range.  Acute on chronic hypoxic and hypercapnic respiratory failure due to COPD exacerbation and CAP -Patient is on trilogy vent and oxygen 2 L as needed at baseline -ABG improved with BiPAP.  Minimal oxygen to keep saturation above 88%.  -Completed 5 days of doxycycline and 3 days of ceftriaxone -Completed course of systemic steroid -Apparently, SNF does not take patients with trilogy vent.  So BiPAP on discharge.  Now down to 4 L of oxygen and saturating about 94%.  Atrial fibrillation with RVR: RVR resolved.  TTE without significant  finding. -Changed metoprolol to Cardizem given underlying COPD -Continue Eliquis-was on this for DVT -Discontinued as needed hydralazine  Dysphagia: -Dysphagia 2 diet per SLP. -Continue  aspiration precautions  UTI-completed antibiotic course with ceftriaxone and Keflex  Oropharyngeal dysphagia -SLP recommended dysphagia 2 diet  Mild hyperkalemia -Corrected with Lokelma.    Uncontrolled hypertension: Improved. -Continue clonidine 0.2 mg twice daily -Continue Cardizem CD 180 mg daily -Discontinued as needed hydralazine  Uncontrolled IDDM-2 with hyperglycemia and neuropathy: A1c 7.2%. Recent Labs  Lab 07/18/20 1813 07/18/20 1856 07/18/20 2202 07/19/20 0819 07/19/20 1146  GLUCAP 68* 86 159* 207* 220*  She did have some hypoglycemia last evening but hyperglycemic again today.  Continue Levemir to 30 units and continue SSI  History of bipolar disorder -Psychiatry decreased Depakote to 500 mg twice daily (home dose) in the setting of thrombocytopenia. -Patient is also on loxapine 20 mg daily.  Since she is on 10 mg daily SNF.   History of DVT -Continue Eliquis.  Acute urinary retention: Continue indwelling Foley catheter -Continue p.o. bethanechol -Renal ultrasound unremarkable.  Needs to mobilize in order to have Foley removed but she is not moving at all.  Continue Flomax.  Foley has been removed and per nursing, she has been voiding without any trouble.  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  Also on psych medications which could potentially contribute.  Recent Labs  Lab 07/13/20 0345 07/14/20 0551 07/15/20 1302 07/16/20 0206 07/17/20 0847 07/18/20 0132 07/19/20 0128  PLT 88* 77* 75* 91* 96* 106* 138*  -Normal platelet bicitrate.  Platelets improving.  Hematology signed off.  May follow-up as outpatient with hematology if needed   Morbid obesity Body mass index is 38.48 kg/m. Nutrition Problem: Increased nutrient needs Etiology: acute illness Signs/Symptoms: estimated needs Interventions: Refer to RD note for recommendations   DVT prophylaxis:   apixaban  (ELIQUIS) tablet 5 mg  Code Status: DNR/DNI Family Communication: None at bedside.  Level of care: Telemetry Medical Status is: Inpatient  Remains inpatient appropriate because: Waiting for insurance decision for SNF  Dispo: The patient is from: SNF, insurance declined SNF.  Did peer to peer with insurance company 07/13/2020.  Daughter appealing SNF declination.  Waiting for decision              Anticipated d/c is to: SNF vs home with home health              Anticipated d/c date is: 1 to 2 days              Patient currently is  medically stable to d/c.   Difficult to place patient No       Consultants:  Psychiatry Hematology-following   Sch Meds:  Scheduled Meds: . apixaban  5 mg Oral BID  . atorvastatin  20 mg Oral QHS  . bethanechol  25 mg Oral TID  . Chlorhexidine Gluconate Cloth  6 each Topical Daily  . cloNIDine  0.2 mg Oral BID  . diltiazem  180 mg Oral Daily  . divalproex  500 mg Oral Q12H  . fluticasone furoate-vilanterol  1 puff Inhalation Daily  . insulin aspart  0-20 Units Subcutaneous TID WC  . insulin aspart  0-5 Units Subcutaneous QHS  . insulin detemir  30 Units Subcutaneous Daily  . loxapine  20 mg Oral QHS  . melatonin  5 mg Oral QHS  . pantoprazole  40 mg Oral Daily  . polyvinyl alcohol  1 drop  Both Eyes Q breakfast  . tamsulosin  0.4 mg Oral QPC breakfast   Continuous Infusions: PRN Meds:.acetaminophen, albuterol, clonazepam, diltiazem, polyethylene glycol, Resource ThickenUp Clear, senna-docusate, traMADol  Antimicrobials: Anti-infectives (From admission, onward)   Start     Dose/Rate Route Frequency Ordered Stop   07/03/20 1400  cephALEXin (KEFLEX) capsule 500 mg        500 mg Oral Every 8 hours 07/03/20 0954 07/07/20 1146   06/30/20 2200  doxycycline (VIBRA-TABS) tablet 100 mg        100 mg Oral Every 12 hours 06/30/20 1612 07/05/20 2159   06/30/20 2030  cefTRIAXone (ROCEPHIN) 2 g in sodium chloride 0.9 % 100 mL IVPB  Status:  Discontinued         2 g 200 mL/hr over 30 Minutes Intravenous Daily at bedtime 06/30/20 2006 07/03/20 0954   06/30/20 1800  Ampicillin-Sulbactam (UNASYN) 3 g in sodium chloride 0.9 % 100 mL IVPB  Status:  Discontinued        3 g 200 mL/hr over 30 Minutes Intravenous Every 6 hours 06/30/20 1703 06/30/20 1954       I have personally reviewed the following labs and images: CBC: Recent Labs  Lab 07/15/20 1302 07/16/20 0206 07/17/20 0847 07/18/20 0132 07/19/20 0128  WBC 3.9* 3.6* 3.3* 5.2 7.8  NEUTROABS 2.1 1.8 1.8 3.6 5.3  HGB 12.2 10.3* 10.9* 11.3* 12.1  HCT 38.5 32.4* 33.6* 34.3* 37.7  MCV 98.0 97.3 96.6 94.2 95.4  PLT 75* 91* 96* 106* 138*   BMP &GFR Recent Labs  Lab 07/14/20 0551 07/16/20 0206 07/17/20 0257  NA 140 141 140  K 3.9 4.0 3.5  CL 91* 94* 93*  CO2 39* 37* 35*  GLUCOSE 172* 197* 262*  BUN 26* 16 15  CREATININE 0.48 0.43* 0.33*  CALCIUM 8.4* 8.5* 8.5*   Estimated Creatinine Clearance: 82.1 mL/min (A) (by C-G formula based on SCr of 0.33 mg/dL (L)). Liver & Pancreas: No results for input(s): AST, ALT, ALKPHOS, BILITOT, PROT, ALBUMIN in the last 168 hours. No results for input(s): LIPASE, AMYLASE in the last 168 hours. No results for input(s): AMMONIA in the last 168 hours. Diabetic: No results for input(s): HGBA1C in the last 72 hours. Recent Labs  Lab 07/18/20 1813 07/18/20 1856 07/18/20 2202 07/19/20 0819 07/19/20 1146  GLUCAP 68* 86 159* 207* 220*   Cardiac Enzymes: No results for input(s): CKTOTAL, CKMB, CKMBINDEX, TROPONINI in the last 168 hours. No results for input(s): PROBNP in the last 8760 hours. Coagulation Profile: No results for input(s): INR, PROTIME in the last 168 hours. Thyroid Function Tests: No results for input(s): TSH, T4TOTAL, FREET4, T3FREE, THYROIDAB in the last 72 hours. Lipid Profile: No results for input(s): CHOL, HDL, LDLCALC, TRIG, CHOLHDL, LDLDIRECT in the last 72 hours. Anemia Panel: No results for input(s): VITAMINB12,  FOLATE, FERRITIN, TIBC, IRON, RETICCTPCT in the last 72 hours. Urine analysis:    Component Value Date/Time   COLORURINE YELLOW 06/30/2020 1211   APPEARANCEUR TURBID (A) 06/30/2020 1211   LABSPEC 1.023 06/30/2020 1211   PHURINE 5.0 06/30/2020 1211   GLUCOSEU NEGATIVE 06/30/2020 1211   HGBUR NEGATIVE 06/30/2020 1211   BILIRUBINUR NEGATIVE 06/30/2020 1211   KETONESUR 5 (A) 06/30/2020 1211   PROTEINUR 100 (A) 06/30/2020 1211   NITRITE POSITIVE (A) 06/30/2020 1211   LEUKOCYTESUR LARGE (A) 06/30/2020 1211   Sepsis Labs: Invalid input(s): PROCALCITONIN, LACTICIDVEN  Microbiology: Recent Results (from the past 240 hour(s))  SARS CORONAVIRUS 2 (TAT 6-24 HRS) Nasopharyngeal Nasopharyngeal  Swab     Status: None   Collection Time: 07/09/20  3:06 PM   Specimen: Nasopharyngeal Swab  Result Value Ref Range Status   SARS Coronavirus 2 NEGATIVE NEGATIVE Final    Comment: (NOTE) SARS-CoV-2 target nucleic acids are NOT DETECTED.  The SARS-CoV-2 RNA is generally detectable in upper and lower respiratory specimens during the acute phase of infection. Negative results do not preclude SARS-CoV-2 infection, do not rule out co-infections with other pathogens, and should not be used as the sole basis for treatment or other patient management decisions. Negative results must be combined with clinical observations, patient history, and epidemiological information. The expected result is Negative.  Fact Sheet for Patients: HairSlick.nohttps://www.fda.gov/media/138098/download  Fact Sheet for Healthcare Providers: quierodirigir.comhttps://www.fda.gov/media/138095/download  This test is not yet approved or cleared by the [place] FDA and  has been authorized for detection and/or diagnosis of SARS-CoV-2 by FDA under an Emergency Use Authorization (EUA). This EUA will remain  in effect (meaning this test can be used) for the duration of the COVID-19 declaration under Se ction 564(b)(1) of the Act, 21 U.S.C. section  360bbb-3(b)(1), unless the authorization is terminated or revoked sooner.  Performed at [facility], [address] [place], [place] [address]   Urine Culture     Status: Abnormal   Collection Time: 07/11/20  4:14 PM   Specimen: Urine, Catheterized  Result Value Ref Range Status   Specimen Description URINE, CATHETERIZED  Final   Special Requests   Final    NONE Performed at [facility], [address] [place], [place] [address]    Culture >=100,000 COLONIES/mL ENTEROCOCCUS FAECALIS (A)  Final   Report Status 07/15/2020 FINAL  Final   Organism ID, Bacteria ENTEROCOCCUS FAECALIS (A)  Final      Susceptibility   Enterococcus faecalis - MIC*    AMPICILLIN <=2 SENSITIVE Sensitive     NITROFURANTOIN <=16 SENSITIVE Sensitive     VANCOMYCIN 1 SENSITIVE Sensitive     * >=100,000 COLONIES/mL ENTEROCOCCUS FAECALIS    Radiology Studies: No results found.   Hughie Clossavi Pahwani, MD [facility]  If 7PM-7AM, please contact night-coverage [email or web address] 07/19/2020, 1:31 PM

## 2020-07-20 DIAGNOSIS — D696 Thrombocytopenia, unspecified: Secondary | ICD-10-CM | POA: Diagnosis not present

## 2020-07-20 DIAGNOSIS — J441 Chronic obstructive pulmonary disease with (acute) exacerbation: Secondary | ICD-10-CM | POA: Diagnosis not present

## 2020-07-20 LAB — CBC WITH DIFFERENTIAL/PLATELET
Abs Immature Granulocytes: 0.03 10*3/uL (ref 0.00–0.07)
Basophils Absolute: 0 10*3/uL (ref 0.0–0.1)
Basophils Relative: 0 %
Eosinophils Absolute: 0.1 10*3/uL (ref 0.0–0.5)
Eosinophils Relative: 1 %
HCT: 33.4 % — ABNORMAL LOW (ref 36.0–46.0)
Hemoglobin: 10.3 g/dL — ABNORMAL LOW (ref 12.0–15.0)
Immature Granulocytes: 1 %
Lymphocytes Relative: 19 %
Lymphs Abs: 1 10*3/uL (ref 0.7–4.0)
MCH: 29.9 pg (ref 26.0–34.0)
MCHC: 30.8 g/dL (ref 30.0–36.0)
MCV: 97.1 fL (ref 80.0–100.0)
Monocytes Absolute: 0.8 10*3/uL (ref 0.1–1.0)
Monocytes Relative: 15 %
Neutro Abs: 3.5 10*3/uL (ref 1.7–7.7)
Neutrophils Relative %: 64 %
Platelets: 113 10*3/uL — ABNORMAL LOW (ref 150–400)
RBC: 3.44 MIL/uL — ABNORMAL LOW (ref 3.87–5.11)
RDW: 13.9 % (ref 11.5–15.5)
WBC: 5.3 10*3/uL (ref 4.0–10.5)
nRBC: 0 % (ref 0.0–0.2)

## 2020-07-20 LAB — GLUCOSE, CAPILLARY
Glucose-Capillary: 114 mg/dL — ABNORMAL HIGH (ref 70–99)
Glucose-Capillary: 147 mg/dL — ABNORMAL HIGH (ref 70–99)
Glucose-Capillary: 159 mg/dL — ABNORMAL HIGH (ref 70–99)
Glucose-Capillary: 166 mg/dL — ABNORMAL HIGH (ref 70–99)
Glucose-Capillary: 84 mg/dL (ref 70–99)

## 2020-07-20 MED ORDER — SODIUM CHLORIDE 0.9 % IV SOLN: INTRAVENOUS | Status: DC

## 2020-07-20 NOTE — Progress Notes (Signed)
PROGRESS NOTE  Stephanie Rice WYO:[id number] DOB: 04-13-1956   PCP: [name], PA-C  Patient is from: SNF  DOA: 06/30/2020 LOS: 20  Chief complaints: Altered mental status  Brief Narrative / Interim history: 65 year old F with PMH of dementia, intellectual disability, COPD/chronic RF on trilogy vent and 2 L oxygen as needed, recent DVT, morbid obesity, IDDM-2, bipolar disorder and HTN brought to ED from SNF with altered mental status and admitted for acute metabolic encephalopathy in the setting of hypercapnia from acute on chronic respiratory failure and COPD exacerbation, and urinary tract infection.  Treated with steroid, antibiotics and BiPAP with improvement in ABG but waxing and waning mental status and some concern about behavioral issue.  Psychiatry consulted and gave recommendation.  Hospital course also complicated by acute thrombocytopenia and acute urinary retention.  Hematology consulted for thrombocytopenia.  Psychiatry also adjusted psych medications which could potentially contribute to thrombocytopenia.  She has indwelling Foley catheter for acute urinary retention   Subjective: Seen and examined.  She is alert and oriented x1 as usual.  No complaints.  Objective: Vitals:   07/20/20 0200 07/20/20 0348 07/20/20 0800 07/20/20 1200  BP:  137/61  (!) 109/41  Pulse: 79 82  91  Resp: (!) 23 20  17   Temp:  98 F (36.7 C)  98.6 F (37 C)  TempSrc:  Oral  Oral  SpO2: 96% 98% 98% 100%  Weight:      Height:        Intake/Output Summary (Last 24 hours) at 07/20/2020 1251 Last data filed at 07/20/2020 0600 Gross per 24 hour  Intake 103.86 ml  Output --  Net 103.86 ml   Filed Weights   07/07/20 1731  Weight: 104.9 kg    Examination: General exam: Appears calm and comfortable, obese Respiratory system: Diminished breath sounds bilaterally. Respiratory effort normal. Cardiovascular system: S1 & S2 heard, RRR. No JVD, murmurs, rubs, gallops or clicks.  Trace  pitting edema bilateral lower extremity. Gastrointestinal system: Abdomen is nondistended, soft and nontender. No organomegaly or masses felt. Normal bowel sounds heard. Central nervous system: Alert and oriented. No focal neurological deficits. Extremities: Symmetric 5 x 5 power. Skin: No rashes, lesions or ulcers.  Psychiatry: Judgement and insight appear normal. Mood & affect appropriate.   Procedures:  None  Microbiology summarized: COVID-19 and influenza PCR nonreactive.  Assessment & Plan: Acute metabolic encephalopathy/delirium-multifactorial including hypercapnia, UTI, CAP, underlying dementia, bipolar disorder and intellectual disability.  Reportedly waxing or waning suggesting delirium.  She was also on steroids for COPD exacerbation which could have contributed.   CT head, RPR, B12 and TSH without acute finding. Per daughter, she always say no to any question.  She is sleepy but arousable. -Treat treatable causes -Reorientation and delirium precautions -Minimize sedating meds. -Minimal oxygen to keep sat above 88%. -Psychiatry decreased Depakote to 500 mg twice daily in the setting of acute thrombocytopenia.  Patient often remains very sleepy.  CT and MRI brain unremarkable her Depakote level is within normal range.  Acute on chronic hypoxic and hypercapnic respiratory failure due to COPD exacerbation and CAP -Patient is on trilogy vent and oxygen 2 L as needed at baseline -ABG improved with BiPAP.  Minimal oxygen to keep saturation above 88%.  -Completed 5 days of doxycycline and 3 days of ceftriaxone -Completed course of systemic steroid -Apparently, SNF does not take patients with trilogy vent.  So BiPAP on discharge.  Now down to 4 L of oxygen and saturating about 94%.  Atrial  fibrillation with RVR: RVR resolved.  TTE without significant finding. -Changed metoprolol to Cardizem given underlying COPD -Continue Eliquis-was on this for DVT -Discontinued as needed  hydralazine  Dysphagia: -Dysphagia 2 diet per SLP. -Continue aspiration precautions  UTI-completed antibiotic course with ceftriaxone and Keflex  Oropharyngeal dysphagia -SLP recommended dysphagia 2 diet  Mild hyperkalemia -Corrected with Lokelma.    Uncontrolled hypertension: Fluctuating and much better today. -  Due to low blood pressure, hold clonidine was discontinued by nocturnist last night. -Continue Cardizem CD 180 mg daily  Uncontrolled IDDM-2 with hyperglycemia and neuropathy: A1c 7.2%. Recent Labs  Lab 07/19/20 2300 07/20/20 0055 07/20/20 0414 07/20/20 0832 07/20/20 1231  GLUCAP 63* 114* 166* 147* 159*  She did have some hypoglycemia last evening but hyperglycemic again today.  Continue Levemir to 30 units and continue SSI  History of bipolar disorder -Psychiatry decreased Depakote to 500 mg twice daily (home dose) in the setting of thrombocytopenia. -Patient is also on loxapine 20 mg daily.  Since she is on 10 mg daily SNF.   History of DVT -Continue Eliquis.  Acute urinary retention: Continue indwelling Foley catheter -Continue p.o. bethanechol -Renal ultrasound unremarkable.  Needs to mobilize in order to have Foley removed but she is not moving at all.  Continue Flomax.  Foley has been removed and per nursing, she has been voiding without any trouble.  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  Also on psych medications which could potentially contribute.  Recent Labs  Lab 07/14/20 0551 07/15/20 1302 07/16/20 0206 07/17/20 0847 07/18/20 0132 07/19/20 0128 07/20/20 0228  PLT 77* 75* 91* 96* 106* 138* 113*  -Normal platelet bicitrate.  Platelets improving.  Hematology signed off.  May follow-up as outpatient with hematology if needed   Morbid obesity Body mass index is 38.48 kg/m. Nutrition Problem: Increased nutrient needs Etiology: acute illness Signs/Symptoms:  estimated needs Interventions: Refer to RD note for recommendations   DVT prophylaxis:   apixaban (ELIQUIS) tablet 5 mg  Code Status: DNR/DNI Family Communication: None at bedside.  Level of care: Telemetry Medical Status is: Inpatient  Remains inpatient appropriate because: Waiting for insurance decision for SNF  Dispo: The patient is from: SNF, insurance declined SNF.  Did peer to peer with insurance company 07/13/2020.  Daughter appealing SNF declination.  Waiting for decision              Anticipated d/c is to: SNF vs home with home health              Anticipated d/c date is: 1 to 2 days              Patient currently is  medically stable to d/c.   Difficult to place patient No       Consultants:  Psychiatry Hematology-following   Sch Meds:  Scheduled Meds: . apixaban  5 mg Oral BID  . atorvastatin  20 mg Oral QHS  . bethanechol  25 mg Oral TID  . Chlorhexidine Gluconate Cloth  6 each Topical Daily  . diltiazem  180 mg Oral Daily  . divalproex  500 mg Oral Q12H  . fluticasone furoate-vilanterol  1 puff Inhalation Daily  . insulin aspart  0-20 Units Subcutaneous TID WC  . insulin aspart  0-5 Units Subcutaneous QHS  . insulin detemir  30 Units Subcutaneous Daily  . loxapine  20 mg Oral QHS  . melatonin  5 mg Oral QHS  . pantoprazole  40  mg Oral Daily  . polyvinyl alcohol  1 drop Both Eyes Q breakfast  . tamsulosin  0.4 mg Oral QPC breakfast   Continuous Infusions: . sodium chloride 125 mL/hr at 07/20/20 0510   PRN Meds:.acetaminophen, albuterol, clonazepam, diltiazem, polyethylene glycol, Resource ThickenUp Clear, senna-docusate, traMADol  Antimicrobials: Anti-infectives (From admission, onward)   Start     Dose/Rate Route Frequency Ordered Stop   07/03/20 1400  cephALEXin (KEFLEX) capsule 500 mg        500 mg Oral Every 8 hours 07/03/20 0954 07/07/20 1146   06/30/20 2200  doxycycline (VIBRA-TABS) tablet 100 mg        100 mg Oral Every 12 hours 06/30/20  1612 07/05/20 2159   06/30/20 2030  cefTRIAXone (ROCEPHIN) 2 g in sodium chloride 0.9 % 100 mL IVPB  Status:  Discontinued        2 g 200 mL/hr over 30 Minutes Intravenous Daily at bedtime 06/30/20 2006 07/03/20 0954   06/30/20 1800  Ampicillin-Sulbactam (UNASYN) 3 g in sodium chloride 0.9 % 100 mL IVPB  Status:  Discontinued        3 g 200 mL/hr over 30 Minutes Intravenous Every 6 hours 06/30/20 1703 06/30/20 1954       I have personally reviewed the following labs and images: CBC: Recent Labs  Lab 07/16/20 0206 07/17/20 0847 07/18/20 0132 07/19/20 0128 07/20/20 0228  WBC 3.6* 3.3* 5.2 7.8 5.3  NEUTROABS 1.8 1.8 3.6 5.3 3.5  HGB 10.3* 10.9* 11.3* 12.1 10.3*  HCT 32.4* 33.6* 34.3* 37.7 33.4*  MCV 97.3 96.6 94.2 95.4 97.1  PLT 91* 96* 106* 138* 113*   BMP &GFR Recent Labs  Lab 07/14/20 0551 07/16/20 0206 07/17/20 0257  NA 140 141 140  K 3.9 4.0 3.5  CL 91* 94* 93*  CO2 39* 37* 35*  GLUCOSE 172* 197* 262*  BUN 26* 16 15  CREATININE 0.48 0.43* 0.33*  CALCIUM 8.4* 8.5* 8.5*   Estimated Creatinine Clearance: 82.1 mL/min (A) (by C-G formula based on SCr of 0.33 mg/dL (L)). Liver & Pancreas: No results for input(s): AST, ALT, ALKPHOS, BILITOT, PROT, ALBUMIN in the last 168 hours. No results for input(s): LIPASE, AMYLASE in the last 168 hours. No results for input(s): AMMONIA in the last 168 hours. Diabetic: No results for input(s): HGBA1C in the last 72 hours. Recent Labs  Lab 07/19/20 2300 07/20/20 0055 07/20/20 0414 07/20/20 0832 07/20/20 1231  GLUCAP 63* 114* 166* 147* 159*   Cardiac Enzymes: No results for input(s): CKTOTAL, CKMB, CKMBINDEX, TROPONINI in the last 168 hours. No results for input(s): PROBNP in the last 8760 hours. Coagulation Profile: No results for input(s): INR, PROTIME in the last 168 hours. Thyroid Function Tests: No results for input(s): TSH, T4TOTAL, FREET4, T3FREE, THYROIDAB in the last 72 hours. Lipid Profile: No results for  input(s): CHOL, HDL, LDLCALC, TRIG, CHOLHDL, LDLDIRECT in the last 72 hours. Anemia Panel: No results for input(s): VITAMINB12, FOLATE, FERRITIN, TIBC, IRON, RETICCTPCT in the last 72 hours. Urine analysis:    Component Value Date/Time   COLORURINE YELLOW 06/30/2020 1211   APPEARANCEUR TURBID (A) 06/30/2020 1211   LABSPEC 1.023 06/30/2020 1211   PHURINE 5.0 06/30/2020 1211   GLUCOSEU NEGATIVE 06/30/2020 1211   HGBUR NEGATIVE 06/30/2020 1211   BILIRUBINUR NEGATIVE 06/30/2020 1211   KETONESUR 5 (A) 06/30/2020 1211   PROTEINUR 100 (A) 06/30/2020 1211   NITRITE POSITIVE (A) 06/30/2020 1211   LEUKOCYTESUR LARGE (A) 06/30/2020 1211   Sepsis Labs: Invalid input(s):  PROCALCITONIN, LACTICIDVEN  Microbiology: Recent Results (from the past 240 hour(s))  Urine Culture     Status: Abnormal   Collection Time: 07/11/20  4:14 PM   Specimen: Urine, Catheterized  Result Value Ref Range Status   Specimen Description URINE, CATHETERIZED  Final   Special Requests   Final    NONE Performed at [facility], [address], [place], [place] [address]    Culture >=100,000 COLONIES/mL ENTEROCOCCUS FAECALIS (A)  Final   Report Status 07/15/2020 FINAL  Final   Organism ID, Bacteria ENTEROCOCCUS FAECALIS (A)  Final      Susceptibility   Enterococcus faecalis - MIC*    AMPICILLIN <=2 SENSITIVE Sensitive     NITROFURANTOIN <=16 SENSITIVE Sensitive     VANCOMYCIN 1 SENSITIVE Sensitive     * >=100,000 COLONIES/mL ENTEROCOCCUS FAECALIS    Radiology Studies: No results found.   Hughie Closs, MD [facility]  If 7PM-7AM, please contact night-coverage [email or web address] 07/20/2020, 12:51 PM

## 2020-07-20 NOTE — Plan of Care (Signed)

## 2020-07-20 NOTE — Progress Notes (Signed)
Reached to on call MD d/t patient's soft blood pressure in the past 2 days. Obtained new orders for IVF and to d'c clonidine.

## 2020-07-20 NOTE — Progress Notes (Signed)
Physical Therapy Treatment Patient Details Name: Stephanie Rice MRN: [id number] DOB: October 26, 1955 Today's Date: 07/20/2020    History of Present Illness Stephanie Rice is a 65 y.o. female with medical history significant of chronic hypoxic and hypercapnic respiratory failure secondary COPD, on Trelegy PRN +2 L PRN, HTN, recent diagnosed DVT, morbid obesity, IDDM, Dementia, bipolar disorder, presented with altered mentation.    PT Comments    Patient in cheerful mood on arrival. Reporting dry mouth and noted she is npo for a procedure per orders. Obtained mouth care kit and able to use this to motivate pt to sit up on EOB and perform mouth care. Pt able to use RUE to use swab in her mouth x 3 reps (moistened with mouth rinse and mouth gel). Worked on rt wt-shifting as pt with left lean and using LUE for support throughot time at EOB. Pt progressed to midline and able to have no UE support for up to 30 seconds at a time. Noted to be incontinent and returned to supine with nursing in to assist with scooting pt up in the bed.     Follow Up Recommendations  SNF;Supervision/Assistance - 24 hour (noted SNF denial is under appeal)     Equipment Recommendations  Other (comment) (hoyer with hoyer pad (noted daughter does not want w/c and insurance won't pay for another hospital bed))    Recommendations for Other Services       Precautions / Restrictions Precautions Precautions: Fall Precaution Comments: monitor O2, delirium    Mobility  Bed Mobility Overal bed mobility: Needs Assistance Bed Mobility: Rolling Rolling: Mod assist Sidelying to sit: Mod assist     Sit to sidelying: Mod assist General bed mobility comments: pt initiates (with cues) to bend opposite leg, turn head, and reach across towards rail; assists with UEs but requires mod assist to complete rolling onto her side; able to take her feet over EOB and assist to raise torso; return to sidelying +1 but requires +2 total to scoot  up to [place] transfer comment: unable to progress beyond EOB  Ambulation/Gait             General Gait Details: unable due to cognition   Stairs             Wheelchair Mobility    Modified Rankin (Stroke Patients Only)       Balance Overall balance assessment: Needs assistance Sitting-balance support: No upper extremity supported;Feet supported Sitting balance-Leahy Scale: Poor Sitting balance - Comments: reliant on at least Min A for sitting EOB Postural control: Left lateral lean                                  Cognition Arousal/Alertness: Awake/alert Behavior During Therapy: Flat affect Overall Cognitive Status: No family/caregiver present to determine baseline cognitive functioning Area of Impairment: Orientation;Attention;Following commands;Problem solving                 Orientation Level: Disoriented to;Place;Situation;Time (did know it was March with increased time to answer) Current Attention Level: Sustained Memory: Decreased short-term memory Following Commands: Follows one step commands with increased time   Awareness: Intellectual Problem Solving: Slow processing;Decreased initiation;Difficulty sequencing;Requires verbal cues;Requires tactile cues General Comments: following single step commands more consistently and with less delay      Exercises General  Exercises - Lower Extremity Long Arc Quad:  (unable due to incr left lean in sitting) Heel Slides: Right;Left;Supine;5 reps;AROM Other Exercises Other Exercises: in sitting she used LUE support to maintain midline (without she had LOB to her left). Able to give "high-fives" with RUE encouraging wt-shifting over her rt ischium and reducing amount of weight through her LUE    General Comments General comments (skin integrity, edema, etc.): BP supine 106/76; unable to get BP cuff to inflate once sitting or on return to supine; pt  feeling dizzy at EOB and returned to supine      Pertinent Vitals/Pain Pain Assessment: Faces Faces Pain Scale: No hurt Pain Location:  (tolerated donning socks with no pain)    Home Living                      Prior Function            PT Goals (current goals can now be found in the care plan section) Acute Rehab PT Goals Patient Stated Goal: unable to state PT Goal Formulation: Patient unable to participate in goal setting Time For Goal Achievement: 07/30/20 Progress towards PT goals: Progressing toward goals    Frequency    Min 2X/week      PT Plan Equipment recommendations need to be updated    Co-evaluation PT/OT/SLP Co-Evaluation/Treatment: Yes            AM-PAC PT "6 Clicks" Mobility   Outcome Measure  Help needed turning from your back to your side while in a flat bed without using bedrails?: A Lot Help needed moving from lying on your back to sitting on the side of a flat bed without using bedrails?: A Lot Help needed moving to and from a bed to a chair (including a wheelchair)?: Total Help needed standing up from a chair using your arms (e.g., wheelchair or bedside chair)?: Total Help needed to walk in hospital room?: Total Help needed climbing 3-5 steps with a railing? : Total 6 Click Score: 8    End of Session Equipment Utilized During Treatment: Oxygen Activity Tolerance: Patient limited by fatigue;Treatment limited secondary to medical complications (Comment) (dizziness) Patient left: in bed;with call bell/phone within reach;with bed alarm set;with nursing/sitter in room Nurse Communication: Other (comment) (+incontinence of B/B; NT in to clean pt) PT Visit Diagnosis: Other abnormalities of gait and mobility (R26.89)     Time: 5573-2202 PT Time Calculation (min) (ACUTE ONLY): 19 min  Charges:  $Therapeutic Activity: 8-22 mins                      Arby Barrette, PT Pager [phone]    Rexanne Mano 07/20/2020, 11:45 AM

## 2020-07-20 NOTE — Progress Notes (Signed)
The overall trend is that the platelet count is gone up.  There was 113,000 this morning.  I realize it was a bit higher yesterday.  This certainly may drift.  I do not think that there is any obvious bleeding.  Her blood sugars have been on the high side recently.  Again, we really know how to do all that much right now.  She is pretty much a symptomatic.  She is on Eliquis.  Her platelet count may always be a little on the lower side because of all her medications.  Again, I do not know if the Enterococcus in the urine was ever treated or needed to be treated.  Christin Bach, MD  Franky Macho 1:37

## 2020-07-20 NOTE — TOC Progression Note (Addendum)
Transition of Care [facility]) - Progression Note    Patient Details  Name: Stephanie Rice MRN: [id number] Date of Birth: June 20, 1955  Transition of [facility]) CM/SW Contact  [name], LCSW Phone Number: 07/20/2020, 1:51 PM  Clinical Narrative:    CSW received phone call from [place].  She submitted the form on Friday and has not heard anything else regarding the appeal.  CSW called [employer] again and was told that the appeal was not expedited and could take up to 2 weeks to be reviewed.  CSW was referred to pt navigator Oletta Lamas, [phone], who was not available.  Message left.  CSW messaged supervisor Macario Golds on next moves.  She has asked [facility] dept director to reach out to [facility] for clarification.    Expected Discharge Plan: Skilled Nursing Facility Barriers to Discharge: Continued Medical Work up  Expected Discharge Plan and Services Expected Discharge Plan: Skilled Nursing Facility     Post Acute Care Choice:  (daughter requesting LTAC) Living arrangements for the past 2 months: Skilled Nursing Facility (was hospitalized at [facility] for 30+ days prior to SNF)                           HH Arranged: PT,OT,Nurse's Aide HH Agency: [facility] Date [facility] Agency Contacted: 07/14/20 Time HH Agency Contacted: 1300 Representative spoke with at [facility] Agency: Kandee Keen   Social Determinants of Health (SDOH) Interventions    Readmission Risk Interventions No flowsheet data found.

## 2020-07-20 NOTE — Progress Notes (Signed)
Nutrition Follow Up  DOCUMENTATION CODES:   Not applicable  INTERVENTION:   Intake off/on given fluctuating mentation. If within GOC, consider feeding tube placement with initiation of EN.   Needs feeding assistance with each meal   Magic cup TID with meals, each supplement provides 290 kcal and 9 grams of protein  MVI daily   NUTRITION DIAGNOSIS:   Increased nutrient needs related to acute illness as evidenced by estimated needs.  Ongoing  GOAL:   Patient will meet greater than or equal to 90% of their needs   Progressing   MONITOR:   PO intake,Diet advancement,Supplement acceptance,Labs,Weight trends,I & O's  REASON FOR ASSESSMENT:   Consult Assessment of nutrition requirement/status  ASSESSMENT:   Patient with PMH significant for chronic hypoxic and hypercapnic respiratory failure 2/2 to COPD, HTN, recent diagnosis of DVT, DM, dementia, and bipolar disorder. Presents this admission with COPD exacerbation.  2/17- diet advanced to DYS 2, honey thick liquids  Patient unable to provide information. Discussed with RN that intake fluctuates given poor mentation. Yesterday patient consumed 25% of each meal and had 100% of breakfast. She's consuming magic cup 2-3 times with meals. Consider placement of feeding tube if intake remains inadequate.   Admission weight:  Current weight:   Drips: NS @ 125 ml/hr  Medications: SS novolog, levemir Labs: CBG 52-166  Diet Order:   Diet Order            DIET DYS 2 Room service appropriate? No; Fluid consistency: Honey Thick  Diet effective now                 EDUCATION NEEDS:   Not appropriate for education at this time  Skin:  Skin Assessment: Reviewed RN Assessment  Last BM:  3/6  Height:   Ht Readings from Last 1 Encounters:  07/07/20 5\' 5"  (1.651 m)    Weight:   Wt Readings from Last 1 Encounters:  07/07/20 104.9 kg    BMI:  Body mass index is 38.48 kg/m.  Estimated Nutritional Needs:   Kcal:   1800-2000 kcal  Protein:  90-105 grams  Fluid:  >/= 1.8 L/day  07/09/20 RD, LDN Clinical Nutrition Pager listed in AMION

## 2020-07-21 DIAGNOSIS — J441 Chronic obstructive pulmonary disease with (acute) exacerbation: Secondary | ICD-10-CM | POA: Diagnosis not present

## 2020-07-21 LAB — CBC WITH DIFFERENTIAL/PLATELET
Abs Immature Granulocytes: 0.04 10*3/uL (ref 0.00–0.07)
Basophils Absolute: 0 10*3/uL (ref 0.0–0.1)
Basophils Relative: 0 %
Eosinophils Absolute: 0 10*3/uL (ref 0.0–0.5)
Eosinophils Relative: 1 %
HCT: 32.5 % — ABNORMAL LOW (ref 36.0–46.0)
Hemoglobin: 10.7 g/dL — ABNORMAL LOW (ref 12.0–15.0)
Immature Granulocytes: 1 %
Lymphocytes Relative: 29 %
Lymphs Abs: 1.3 10*3/uL (ref 0.7–4.0)
MCH: 31 pg (ref 26.0–34.0)
MCHC: 32.9 g/dL (ref 30.0–36.0)
MCV: 94.2 fL (ref 80.0–100.0)
Monocytes Absolute: 0.7 10*3/uL (ref 0.1–1.0)
Monocytes Relative: 17 %
Neutro Abs: 2.2 10*3/uL (ref 1.7–7.7)
Neutrophils Relative %: 52 %
Platelets: 126 10*3/uL — ABNORMAL LOW (ref 150–400)
RBC: 3.45 MIL/uL — ABNORMAL LOW (ref 3.87–5.11)
RDW: 14 % (ref 11.5–15.5)
WBC: 4.3 10*3/uL (ref 4.0–10.5)
nRBC: 0 % (ref 0.0–0.2)

## 2020-07-21 LAB — GLUCOSE, CAPILLARY
Glucose-Capillary: 130 mg/dL — ABNORMAL HIGH (ref 70–99)
Glucose-Capillary: 138 mg/dL — ABNORMAL HIGH (ref 70–99)
Glucose-Capillary: 155 mg/dL — ABNORMAL HIGH (ref 70–99)
Glucose-Capillary: 170 mg/dL — ABNORMAL HIGH (ref 70–99)
Glucose-Capillary: 202 mg/dL — ABNORMAL HIGH (ref 70–99)

## 2020-07-21 NOTE — Plan of Care (Signed)

## 2020-07-21 NOTE — Plan of Care (Signed)
  Problem: Clinical Measurements: Goal: Ability to maintain clinical measurements within normal limits will improve Outcome: Progressing   Problem: Nutrition: Goal: Adequate nutrition will be maintained Outcome: Progressing   Problem: Coping: Goal: Level of anxiety will decrease Outcome: Progressing   Problem: Elimination: Goal: Will not experience complications related to bowel motility Outcome: Progressing

## 2020-07-21 NOTE — Progress Notes (Signed)
PROGRESS NOTE  Stephanie Rice TDV:[id number] DOB: 30-Oct-1955   PCP: [name], PA-C  Patient is from: SNF  DOA: 06/30/2020 LOS: 21  Chief complaints: Altered mental status  Brief Narrative / Interim history: 64 year old F with PMH of dementia, intellectual disability, COPD/chronic RF on trilogy vent and 2 L oxygen as needed, recent DVT, morbid obesity, IDDM-2, bipolar disorder and HTN brought to ED from SNF with altered mental status and admitted for acute metabolic encephalopathy in the setting of hypercapnia from acute on chronic respiratory failure and COPD exacerbation, and urinary tract infection.  Treated with steroid, antibiotics and BiPAP with improvement in ABG but waxing and waning mental status and some concern about behavioral issue.  Psychiatry consulted and gave recommendation.  Hospital course also complicated by acute thrombocytopenia and acute urinary retention.  Hematology consulted for thrombocytopenia.  Psychiatry also adjusted psych medications which could potentially contribute to thrombocytopenia.  Her platelets have improved.  Oncology is following peripherally as well.  Patient has remained stable since last several days waiting for insurance approval for SNF. Daughter has appealed, waiting for final decision from insurance.  Subjective: Seen and examined.  Alert and oriented x1 as usual.  No new complaint.  Objective: Vitals:   07/20/20 2116 07/21/20 0110 07/21/20 0523 07/21/20 1311  BP: (!) 78/32 (!) 94/36 (!) 177/63 (!) 166/75  Pulse: 99 78 (!) 101 95  Resp: 18  20 19   Temp: 98 F (36.7 C)  97.8 F (36.6 C) 98.3 F (36.8 C)  TempSrc: Oral  Oral   SpO2: 100% 93% 90% 98%  Weight:      Height:        Intake/Output Summary (Last 24 hours) at 07/21/2020 1316 Last data filed at 07/21/2020 0936 Gross per 24 hour  Intake --  Output 1150 ml  Net -1150 ml   Filed Weights   07/07/20 1731  Weight: 104.9 kg    Examination:  General exam: Appears  calm and comfortable  Respiratory system: Diminished breath sounds bilaterally. Respiratory effort normal. Cardiovascular system: S1 & S2 heard, RRR. No JVD, murmurs, rubs, gallops or clicks. No pedal edema. Gastrointestinal system: Abdomen is nondistended, soft and nontender. No organomegaly or masses felt. Normal bowel sounds heard. Central nervous system: Alert and oriented x1. No focal neurological deficits. Extremities: Symmetric 5 x 5 power. Skin: No rashes, lesions or ulcers.  Psychiatry: Judgement and insight appear poor  Procedures:  None  Microbiology summarized: COVID-19 and influenza PCR nonreactive.  Assessment & Plan: Acute metabolic encephalopathy/delirium-multifactorial including hypercapnia, UTI, CAP, underlying dementia, bipolar disorder and intellectual disability.  Reportedly waxing or waning suggesting delirium.  She was also on steroids for COPD exacerbation which could have contributed.   CT head, RPR, B12 and TSH without acute finding. Per daughter, she always say no to any question.  She is sleepy but arousable. -Treat treatable causes -Reorientation and delirium precautions -Minimize sedating meds. -Minimal oxygen to keep sat above 88%. -Psychiatry decreased Depakote to 500 mg twice daily in the setting of acute thrombocytopenia.  Patient often remains very sleepy.  CT and MRI brain unremarkable her Depakote level is within normal range.  Acute on chronic hypoxic and hypercapnic respiratory failure due to COPD exacerbation and CAP -Patient is on trilogy vent and oxygen 2 L as needed at baseline -ABG improved with BiPAP.  Minimal oxygen to keep saturation above 88%.  -Completed 5 days of doxycycline and 3 days of ceftriaxone -Completed course of systemic steroid -Apparently, SNF does not take  patients with trilogy vent.  So BiPAP on discharge.  Now down to 4 L of oxygen and saturating about 94%.  Atrial fibrillation with RVR: RVR resolved.  TTE without  significant finding. -Changed metoprolol to Cardizem given underlying COPD -Continue Eliquis-was on this for DVT -Discontinued as needed hydralazine  Dysphagia: -Dysphagia 2 diet per SLP. -Continue aspiration precautions  UTI-completed antibiotic course with ceftriaxone and Keflex  Oropharyngeal dysphagia -SLP recommended dysphagia 2 diet  Mild hyperkalemia -Corrected with Lokelma.    Uncontrolled hypertension: Fluctuating and much better today. -  Due to low blood pressure, hold clonidine was discontinued by nocturnist last night. -Continue Cardizem CD 180 mg daily  Uncontrolled IDDM-2 with hyperglycemia and neuropathy: A1c 7.2%. Recent Labs  Lab 07/20/20 1231 07/20/20 1549 07/21/20 0036 07/21/20 0733 07/21/20 1125  GLUCAP 159* 84 202* 170* 155*  She did have some hypoglycemia last evening but hyperglycemic again today.  Continue Levemir to 30 units and continue SSI  History of bipolar disorder -Psychiatry decreased Depakote to 500 mg twice daily (home dose) in the setting of thrombocytopenia. -Patient is also on loxapine 20 mg daily.  Since she is on 10 mg daily SNF.   History of DVT -Continue Eliquis.  Acute urinary retention:  -Renal ultrasound unremarkable. Foley has been removed and per nursing, she has been voiding without any trouble.   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  Also on psych medications which could potentially contribute.  Recent Labs  Lab 07/15/20 1302 07/16/20 0206 07/17/20 0847 07/18/20 0132 07/19/20 0128 07/20/20 0228 07/21/20 0100  PLT 75* 91* 96* 106* 138* 113* 126*  -Normal platelet bicitrate.  Platelets improving.  Hematology signed off.  May follow-up as outpatient with hematology if needed   Morbid obesity Body mass index is 38.48 kg/m. Nutrition Problem: Increased nutrient needs Etiology: acute illness Signs/Symptoms: estimated  needs Interventions: Refer to RD note for recommendations   DVT prophylaxis:   apixaban (ELIQUIS) tablet 5 mg  Code Status: DNR/DNI Family Communication: None at bedside.  Level of care: Telemetry Medical Status is: Inpatient  Remains inpatient appropriate because: Waiting for insurance decision for SNF  Dispo: The patient is from: SNF, insurance declined SNF.  Did peer to peer with insurance company 07/13/2020.  Daughter appealing SNF declination.  Waiting for decision              Anticipated d/c is to: SNF vs home with home health              Anticipated d/c date is: 1 to 2 days              Patient currently is  medically stable to d/c.   Difficult to place patient No       Consultants:  Psychiatry Hematology-following   Sch Meds:  Scheduled Meds: . apixaban  5 mg Oral BID  . atorvastatin  20 mg Oral QHS  . bethanechol  25 mg Oral TID  . Chlorhexidine Gluconate Cloth  6 each Topical Daily  . diltiazem  180 mg Oral Daily  . divalproex  500 mg Oral Q12H  . fluticasone furoate-vilanterol  1 puff Inhalation Daily  . insulin aspart  0-20 Units Subcutaneous TID WC  . insulin aspart  0-5 Units Subcutaneous QHS  . insulin detemir  30 Units Subcutaneous Daily  . loxapine  20 mg Oral QHS  . melatonin  5 mg Oral QHS  . pantoprazole  40 mg Oral Daily  .  polyvinyl alcohol  1 drop Both Eyes Q breakfast  . tamsulosin  0.4 mg Oral QPC breakfast   Continuous Infusions: . sodium chloride 125 mL/hr at 07/21/20 1311   PRN Meds:.acetaminophen, albuterol, clonazepam, diltiazem, polyethylene glycol, Resource ThickenUp Clear, senna-docusate, traMADol  Antimicrobials: Anti-infectives (From admission, onward)   Start     Dose/Rate Route Frequency Ordered Stop   07/03/20 1400  cephALEXin (KEFLEX) capsule 500 mg        500 mg Oral Every 8 hours 07/03/20 0954 07/07/20 1146   06/30/20 2200  doxycycline (VIBRA-TABS) tablet 100 mg        100 mg Oral Every 12 hours 06/30/20 1612 07/05/20  2159   06/30/20 2030  cefTRIAXone (ROCEPHIN) 2 g in sodium chloride 0.9 % 100 mL IVPB  Status:  Discontinued        2 g 200 mL/hr over 30 Minutes Intravenous Daily at bedtime 06/30/20 2006 07/03/20 0954   06/30/20 1800  Ampicillin-Sulbactam (UNASYN) 3 g in sodium chloride 0.9 % 100 mL IVPB  Status:  Discontinued        3 g 200 mL/hr over 30 Minutes Intravenous Every 6 hours 06/30/20 1703 06/30/20 1954       I have personally reviewed the following labs and images: CBC: Recent Labs  Lab 07/17/20 0847 07/18/20 0132 07/19/20 0128 07/20/20 0228 07/21/20 0100  WBC 3.3* 5.2 7.8 5.3 4.3  NEUTROABS 1.8 3.6 5.3 3.5 2.2  HGB 10.9* 11.3* 12.1 10.3* 10.7*  HCT 33.6* 34.3* 37.7 33.4* 32.5*  MCV 96.6 94.2 95.4 97.1 94.2  PLT 96* 106* 138* 113* 126*   BMP &GFR Recent Labs  Lab 07/16/20 0206 07/17/20 0257  NA 141 140  K 4.0 3.5  CL 94* 93*  CO2 37* 35*  GLUCOSE 197* 262*  BUN 16 15  CREATININE 0.43* 0.33*  CALCIUM 8.5* 8.5*   Estimated Creatinine Clearance: 82.1 mL/min (A) (by C-G formula based on SCr of 0.33 mg/dL (L)). Liver & Pancreas: No results for input(s): AST, ALT, ALKPHOS, BILITOT, PROT, ALBUMIN in the last 168 hours. No results for input(s): LIPASE, AMYLASE in the last 168 hours. No results for input(s): AMMONIA in the last 168 hours. Diabetic: No results for input(s): HGBA1C in the last 72 hours. Recent Labs  Lab 07/20/20 1231 07/20/20 1549 07/21/20 0036 07/21/20 0733 07/21/20 1125  GLUCAP 159* 84 202* 170* 155*   Cardiac Enzymes: No results for input(s): CKTOTAL, CKMB, CKMBINDEX, TROPONINI in the last 168 hours. No results for input(s): PROBNP in the last 8760 hours. Coagulation Profile: No results for input(s): INR, PROTIME in the last 168 hours. Thyroid Function Tests: No results for input(s): TSH, T4TOTAL, FREET4, T3FREE, THYROIDAB in the last 72 hours. Lipid Profile: No results for input(s): CHOL, HDL, LDLCALC, TRIG, CHOLHDL, LDLDIRECT in the last 72  hours. Anemia Panel: No results for input(s): VITAMINB12, FOLATE, FERRITIN, TIBC, IRON, RETICCTPCT in the last 72 hours. Urine analysis:    Component Value Date/Time   COLORURINE YELLOW 06/30/2020 1211   APPEARANCEUR TURBID (A) 06/30/2020 1211   LABSPEC 1.023 06/30/2020 1211   PHURINE 5.0 06/30/2020 1211   GLUCOSEU NEGATIVE 06/30/2020 1211   HGBUR NEGATIVE 06/30/2020 1211   BILIRUBINUR NEGATIVE 06/30/2020 1211   KETONESUR 5 (A) 06/30/2020 1211   PROTEINUR 100 (A) 06/30/2020 1211   NITRITE POSITIVE (A) 06/30/2020 1211   LEUKOCYTESUR LARGE (A) 06/30/2020 1211   Sepsis Labs: Invalid input(s): PROCALCITONIN, LACTICIDVEN  Microbiology: Recent Results (from the past 240 hour(s))  Urine Culture  Status: Abnormal   Collection Time: 07/11/20  4:14 PM   Specimen: Urine, Catheterized  Result Value Ref Range Status   Specimen Description URINE, CATHETERIZED  Final   Special Requests   Final    NONE Performed at [facility], [address] [place], [place] [address]    Culture >=100,000 COLONIES/mL ENTEROCOCCUS FAECALIS (A)  Final   Report Status 07/15/2020 FINAL  Final   Organism ID, Bacteria ENTEROCOCCUS FAECALIS (A)  Final      Susceptibility   Enterococcus faecalis - MIC*    AMPICILLIN <=2 SENSITIVE Sensitive     NITROFURANTOIN <=16 SENSITIVE Sensitive     VANCOMYCIN 1 SENSITIVE Sensitive     * >=100,000 COLONIES/mL ENTEROCOCCUS FAECALIS    Radiology Studies: No results found.   Hughie Closs, MD [facility]  If 7PM-7AM, please contact night-coverage [email or web address] 07/21/2020, 1:16 PM

## 2020-07-22 DIAGNOSIS — J9621 Acute and chronic respiratory failure with hypoxia: Secondary | ICD-10-CM | POA: Diagnosis not present

## 2020-07-22 DIAGNOSIS — J441 Chronic obstructive pulmonary disease with (acute) exacerbation: Secondary | ICD-10-CM | POA: Diagnosis not present

## 2020-07-22 DIAGNOSIS — J9622 Acute and chronic respiratory failure with hypercapnia: Secondary | ICD-10-CM | POA: Diagnosis not present

## 2020-07-22 DIAGNOSIS — G9341 Metabolic encephalopathy: Secondary | ICD-10-CM | POA: Diagnosis not present

## 2020-07-22 LAB — CBC WITH DIFFERENTIAL/PLATELET
Abs Immature Granulocytes: 0.04 K/uL (ref 0.00–0.07)
Basophils Absolute: 0 K/uL (ref 0.0–0.1)
Basophils Relative: 0 %
Eosinophils Absolute: 0 K/uL (ref 0.0–0.5)
Eosinophils Relative: 1 %
HCT: 37 % (ref 36.0–46.0)
Hemoglobin: 11.4 g/dL — ABNORMAL LOW (ref 12.0–15.0)
Immature Granulocytes: 1 %
Lymphocytes Relative: 34 %
Lymphs Abs: 1.3 K/uL (ref 0.7–4.0)
MCH: 30.6 pg (ref 26.0–34.0)
MCHC: 30.8 g/dL (ref 30.0–36.0)
MCV: 99.2 fL (ref 80.0–100.0)
Monocytes Absolute: 0.8 K/uL (ref 0.1–1.0)
Monocytes Relative: 22 %
Neutro Abs: 1.6 K/uL — ABNORMAL LOW (ref 1.7–7.7)
Neutrophils Relative %: 42 %
Platelets: 145 K/uL — ABNORMAL LOW (ref 150–400)
RBC: 3.73 MIL/uL — ABNORMAL LOW (ref 3.87–5.11)
RDW: 13.9 % (ref 11.5–15.5)
WBC: 3.8 K/uL — ABNORMAL LOW (ref 4.0–10.5)
nRBC: 0 % (ref 0.0–0.2)

## 2020-07-22 LAB — GLUCOSE, CAPILLARY
Glucose-Capillary: 143 mg/dL — ABNORMAL HIGH (ref 70–99)
Glucose-Capillary: 147 mg/dL — ABNORMAL HIGH (ref 70–99)
Glucose-Capillary: 157 mg/dL — ABNORMAL HIGH (ref 70–99)
Glucose-Capillary: 126 mg/dL — ABNORMAL HIGH (ref 70–99)

## 2020-07-22 MED ORDER — FUROSEMIDE 10 MG/ML IJ SOLN
60.0000 mg | Freq: Once | INTRAMUSCULAR | Status: AC
  Administered 2020-07-22: 60 mg via INTRAVENOUS
  Filled 2020-07-22: qty 6

## 2020-07-22 NOTE — Progress Notes (Signed)
Patient refused cpap tonight. Patient slapped mask off face and out of my hand when RT attempted to put on.

## 2020-07-22 NOTE — Progress Notes (Addendum)
Pt HR in 140s-160s on monitor,PRN Cardizem given . STAT EKG done, Afib RVR confirmed. Paged on call MD, waiting for response. Pt is stable at this time.   Pt desatting in mid 70s-80s. Called RT for bipap placement.

## 2020-07-22 NOTE — Progress Notes (Signed)
Occupational Therapy Treatment Patient Details Name: Stephanie Rice MRN: [id number] DOB: [birth year] Today's Date: 07/22/2020    History of present illness Stephanie Rice is a 65 y.o. female with medical history significant of chronic hypoxic and hypercapnic respiratory failure secondary COPD, on Trelegy PRN +2 L PRN, HTN, recent diagnosed DVT, morbid obesity, IDDM, Dementia, bipolar disorder, presented with altered mentation.   OT comments  Pt with slow progress towards OT goals with decreased ability to follow one step commands today. Pt pleasant and agreeable to attempt sitting EOB for activities,but demonstrated no initiation or assist in getting to EOB despite multimodal cues. Pt overall Total A to sit EOB and unable to maintain balance EOB without extensive support. Due to minimal progress and inconsistent ability to follow one step commands, reducing frequency to 1x/wk.    Follow Up Recommendations  SNF;Supervision/Assistance - 24 hour (noted SNF denial under appeal)   Equipment Recommendations  Wheelchair (measurements OT);Wheelchair cushion (measurements OT);Hospital bed (hoyer lift, hoyer pad)    Recommendations for Other Services      Precautions / Restrictions Precautions Precautions: Fall Precaution Comments: monitor O2, delirium Restrictions Weight Bearing Restrictions: No       Mobility Bed Mobility Overal bed mobility: Needs Assistance Bed Mobility: Supine to Sit     Supine to sit: Total assist;HOB elevated     General bed mobility comments: Total A for EOB attempts. unable to sustain sitting EOB without Total A. No initiation despite multimodal cues    Transfers                 General transfer comment: unable to progress beyond EOB    Balance Overall balance assessment: Needs assistance Sitting-balance support: Bilateral upper extremity supported;Feet supported Sitting balance-Leahy Scale: Zero                                      ADL either performed or assessed with clinical judgement   ADL                                               Vision   Vision Assessment?: No apparent visual deficits   Perception     Praxis      Cognition Arousal/Alertness: Awake/alert Behavior During Therapy: Flat affect Overall Cognitive Status: History of cognitive impairments - at baseline Area of Impairment: Orientation;Attention;Following commands;Problem solving;Memory;Safety/judgement;Awareness                 Orientation Level: Disoriented to;Place;Situation;Time Current Attention Level: Sustained Memory: Decreased short-term memory Following Commands: Follows one step commands with increased time;Follows one step commands inconsistently Safety/Judgement: Decreased awareness of safety;Decreased awareness of deficits Awareness: Intellectual Problem Solving: Slow processing;Decreased initiation;Difficulty sequencing;Requires verbal cues;Requires tactile cues General Comments: decreased ability to follow one step commands today, agreeable to sit EOB but no attempts to assist or initiate despite multimodal cues        Exercises     Shoulder Instructions       General Comments VSS on 4 L O2    Pertinent Vitals/ Pain       Pain Assessment: Faces Faces Pain Scale: No hurt Pain Intervention(s): Monitored during session  Home Living  Prior Functioning/Environment              Frequency  Min 1X/week        Progress Toward Goals  OT Goals(current goals can now be found in the care plan section)  Progress towards OT goals: Not progressing toward goals - comment  Acute Rehab OT Goals Patient Stated Goal: unable to state OT Goal Formulation: Patient unable to participate in goal setting Time For Goal Achievement: 08/06/20 Potential to Achieve Goals: Fair ADL Goals Pt Will Perform Eating: with set-up;with  supervision;bed level;sitting Pt Will Perform Grooming: with min assist;sitting;bed level Pt Will Perform Upper Body Bathing: with mod assist;bed level Pt Will Perform Upper Body Dressing: with mod assist;bed level Additional ADL Goal #1: Pt to demonstrate ability to follow one step commands >50% of the time Additional ADL Goal #2: Pt to be A&Ox2 with use of external cues Additional ADL Goal #3: Pt to demo bed mobility at Min A in prep for ADL transfers  Plan Discharge plan remains appropriate;Frequency needs to be updated    Co-evaluation                 AM-PAC OT "6 Clicks" Daily Activity     Outcome Measure   Help from another person eating meals?: A Little Help from another person taking care of personal grooming?: A Little Help from another person toileting, which includes using toliet, bedpan, or urinal?: Total Help from another person bathing (including washing, rinsing, drying)?: Total Help from another person to put on and taking off regular upper body clothing?: Total Help from another person to put on and taking off regular lower body clothing?: Total 6 Click Score: 10    End of Session Equipment Utilized During Treatment: Oxygen  OT Visit Diagnosis: Other abnormalities of gait and mobility (R26.89);Muscle weakness (generalized) (M62.81);Other symptoms and signs involving cognitive function   Activity Tolerance Other (comment) (limited by cognition)   Patient Left in bed;with call bell/phone within reach;with bed alarm set   Nurse Communication Other (comment);Mobility status (cognition)        Time: 1191-4782 OT Time Calculation (min): 12 min  Charges: OT General Charges $OT Visit: 1 Visit OT Treatments $Therapeutic Activity: 8-22 mins  Bradd Canary, OTR/L Acute Rehab Services Office: [phone]   Lorre Munroe 07/22/2020, 9:54 AM

## 2020-07-22 NOTE — Progress Notes (Signed)
Patient ID: Stephanie Rice, female   DOB: March 04, 1956, 65 y.o.   MRN: [id number]  PROGRESS NOTE    Stephanie Rice  ONG:[id number] DOB: 1955-05-26 DOA: 06/30/2020 PCP: [name], PA-C   Brief Narrative:  65 year old female with history of dementia, intellectual disability, COPD/chronic hypoxic respite failure on trilogy vent and 2 L oxygen as needed, recent DVT, obesity, diabetes mellitus type 2, bipolar disorder and hypertension was brought to the ED from SNF due to altered mental status and admitted for acute metabolic encephalopathy in the setting of hypercapnia from acute on chronic respiratory failure and COPD exacerbation and UTI.  She was treated with steroids, antibiotics and BiPAP with improvement in ABG but waxing and waning mental status and there was some concerns regarding behavioral issues.  Psychiatry was consulted and gave recommendations  Hospital course was complicated by acute thrombocytopenia and acute urinary retention.  Hematology was consulted for thrombocytopenia.  Psychiatry also adjusted psych medications which could potentially contribute to thrombocytopenia.  Her platelets have improved.  Oncology is following peripherally as well.  Patient has remained stable over the last several days waiting for insurance approval for SNF.  Daughter has appealed the discharge process, waiting for final decision from insurance.  Assessment & Plan:   Acute metabolic encephalopathy/delirium -multifactorial including hypercapnia, UTI, CAP, underlying dementia, bipolar disorder and intellectual disability.  Reportedly waxing or waning suggesting delirium.  She was also on steroids for COPD exacerbation which could have contributed.   CT head, RPR, B12 and TSH without acute finding -Still intermittently very drowsy -Reorientation and delirium precautions -Minimize sedating meds. -Psychiatry decreased Depakote to 500 mg twice daily in the setting of acute thrombocytopenia.  Patient  often remains very sleepy.  CT and MRI brain unremarkable her Depakote level is within normal range.  Acute on chronic hypoxic and hypercapnic respiratory failure due to COPD exacerbation and CAP -Patient is on trilogy vent and oxygen 2 L as needed at baseline -ABG improved with BiPAP.  Minimal oxygen to keep saturation above 88%.  -Completed 5 days of doxycycline and 3 days of ceftriaxone -Completed course of systemic steroid -Apparently, SNF does not take patients with trilogy vent.  So, she will need BiPAP on discharge.   -Oxygen requirement still fluctuating: Now on 5 L oxygen via high flow nasal cannula.  Wean off as able.    Atrial fibrillation with RVR -RVR resolved.  TTE without significant finding. -Changed metoprolol to Cardizem given underlying COPD -Continue Eliquis-was on this for DVT  Dysphagia: -Dysphagia 2 diet per SLP. -Continue aspiration precautions  UTI -completed antibiotic course with ceftriaxone and Keflex  Hypertension -Blood pressure improving.  Continue Cardizem.  Uncontrolled IDDM-2 with hyperglycemia and neuropathy: A1c 7.2%. -Continue Levemir along with CBGs with SSI  History of bipolar disorder -Psychiatry decreased Depakote to 500 mg twice daily (home dose) in the setting of thrombocytopenia. -Patient is also on loxapine 20 mg daily -Outpatient follow-up with psychiatry  History of DVT -Continue Eliquis.  Acute urinary retention:  -Renal ultrasound unremarkable. Foley has been removed and per nursing, she has been voiding without any trouble.   Thrombocytopenia- -Platelets improving.  Hematology signed off.  May follow-up as outpatient with hematology if needed  Obesity -Outpatient follow-up  Generalized conditioning -Palliative care consultation for goals of care discussion.  Overall prognosis is poor  DVT prophylaxis: apixaban   Code Status: DNR/DNI Family Communication: None at bedside.  Disposition Plan: Status is:  Inpatient  Remains inpatient appropriate because:Inpatient level of care appropriate due  to severity of illness.  Insurance declined SNF.  Prior hospitalist provided peer to peer with insurance company on 07/13/2020.  Daughter appealing staff declination.  Waiting for decision   Dispo: The patient is from: SNF              Anticipated d/c is to: SNF versus home health.  Patient is currently medically stable for discharge              Patient currently is medically stable to d/c.   Difficult to place patient No  Consultants: Psychiatry/hematology  Procedures: None  Antimicrobials:  Anti-infectives (From admission, onward)   Start     Dose/Rate Route Frequency Ordered Stop   07/03/20 1400  cephALEXin (KEFLEX) capsule 500 mg        500 mg Oral Every 8 hours 07/03/20 0954 07/07/20 1146   06/30/20 2200  doxycycline (VIBRA-TABS) tablet 100 mg        100 mg Oral Every 12 hours 06/30/20 1612 07/05/20 2159   06/30/20 2030  cefTRIAXone (ROCEPHIN) 2 g in sodium chloride 0.9 % 100 mL IVPB  Status:  Discontinued        2 g 200 mL/hr over 30 Minutes Intravenous Daily at bedtime 06/30/20 2006 07/03/20 0954   06/30/20 1800  Ampicillin-Sulbactam (UNASYN) 3 g in sodium chloride 0.9 % 100 mL IVPB  Status:  Discontinued        3 g 200 mL/hr over 30 Minutes Intravenous Every 6 hours 06/30/20 1703 06/30/20 1954       Subjective: Patient seen and examined at bedside.  Wakes up very slightly, already answers any questions.  No overnight fever, vomiting, seizures reported.  Objective: Vitals:   07/22/20 0409 07/22/20 0504 07/22/20 0740 07/22/20 0807  BP:  (!) 167/66 (!) 140/54   Pulse: 91 92 89   Resp: (!) 21 20 17    Temp:  97.9 F (36.6 C) 97.8 F (36.6 C)   TempSrc:  Axillary Axillary   SpO2: 96% 94% 97% 96%  Weight:      Height:        Intake/Output Summary (Last 24 hours) at 07/22/2020 1050 Last data filed at 07/22/2020 0910 Gross per 24 hour  Intake 1730 ml  Output 350 ml  Net 1380 ml    Filed Weights   07/07/20 1731  Weight: 104.9 kg    Examination:  General exam: Appears chronically ill looking.  Currently on 5 L oxygen via high flow nasal cannula.  No distress.   Respiratory system: Bilateral decreased breath sounds at bases with scattered crackles Cardiovascular system: S1 & S2 heard, Rate controlled Gastrointestinal system: Abdomen is obese, nondistended, soft and nontender. Normal bowel sounds heard. Extremities: No cyanosis, clubbing; bilateral lower extremity edema present Central nervous system: Wakes up very slightly, already answers any questions.   No focal neurological deficits. Moving extremities Skin: No rashes, lesions or ulcers Psychiatry: Could not be assessed because of mental status    Data Reviewed: I have personally reviewed following labs and imaging studies  CBC: Recent Labs  Lab 07/18/20 0132 07/19/20 0128 07/20/20 0228 07/21/20 0100 07/22/20 0227  WBC 5.2 7.8 5.3 4.3 3.8*  NEUTROABS 3.6 5.3 3.5 2.2 1.6*  HGB 11.3* 12.1 10.3* 10.7* 11.4*  HCT 34.3* 37.7 33.4* 32.5* 37.0  MCV 94.2 95.4 97.1 94.2 99.2  PLT 106* 138* 113* 126* 145*   Basic Metabolic Panel: Recent Labs  Lab 07/16/20 0206 07/17/20 0257  NA 141 140  K 4.0 3.5  CL  94* 93*  CO2 37* 35*  GLUCOSE 197* 262*  BUN 16 15  CREATININE 0.43* 0.33*  CALCIUM 8.5* 8.5*   GFR: Estimated Creatinine Clearance: 82.1 mL/min (A) (by C-G formula based on SCr of 0.33 mg/dL (L)). Liver Function Tests: No results for input(s): AST, ALT, ALKPHOS, BILITOT, PROT, ALBUMIN in the last 168 hours. No results for input(s): LIPASE, AMYLASE in the last 168 hours. No results for input(s): AMMONIA in the last 168 hours. Coagulation Profile: No results for input(s): INR, PROTIME in the last 168 hours. Cardiac Enzymes: No results for input(s): CKTOTAL, CKMB, CKMBINDEX, TROPONINI in the last 168 hours. BNP (last 3 results) No results for input(s): PROBNP in the last 8760 hours. HbA1C: No  results for input(s): HGBA1C in the last 72 hours. CBG: Recent Labs  Lab 07/21/20 0733 07/21/20 1125 07/21/20 1548 07/21/20 1942 07/22/20 0739  GLUCAP 170* 155* 138* 130* 143*   Lipid Profile: No results for input(s): CHOL, HDL, LDLCALC, TRIG, CHOLHDL, LDLDIRECT in the last 72 hours. Thyroid Function Tests: No results for input(s): TSH, T4TOTAL, FREET4, T3FREE, THYROIDAB in the last 72 hours. Anemia Panel: No results for input(s): VITAMINB12, FOLATE, FERRITIN, TIBC, IRON, RETICCTPCT in the last 72 hours. Sepsis Labs: No results for input(s): PROCALCITON, LATICACIDVEN in the last 168 hours.  No results found for this or any previous visit (from the past 240 hour(s)).       Radiology Studies: No results found.      Scheduled Meds: . apixaban  5 mg Oral BID  . atorvastatin  20 mg Oral QHS  . bethanechol  25 mg Oral TID  . Chlorhexidine Gluconate Cloth  6 each Topical Daily  . diltiazem  180 mg Oral Daily  . divalproex  500 mg Oral Q12H  . fluticasone furoate-vilanterol  1 puff Inhalation Daily  . insulin aspart  0-20 Units Subcutaneous TID WC  . insulin aspart  0-5 Units Subcutaneous QHS  . insulin detemir  30 Units Subcutaneous Daily  . loxapine  20 mg Oral QHS  . melatonin  5 mg Oral QHS  . pantoprazole  40 mg Oral Daily  . polyvinyl alcohol  1 drop Both Eyes Q breakfast   Continuous Infusions: . sodium chloride 125 mL/hr at 07/21/20 1311          Jamie-Lee Galdamez Hanley Ben, MD [facility] 07/22/2020, 10:50 AM

## 2020-07-22 NOTE — Plan of Care (Signed)

## 2020-07-22 NOTE — Progress Notes (Addendum)
HOSPITAL MEDICINE OVERNIGHT EVENT NOTE    Notified by nursing that patient went into rapid atrial fibrillation heart rates in excess of 140 bpm.  This was associated with increasing respiratory distress and desaturations into the 70s and 80's.  Respiratory was notified by nursing and BiPAP was initiated.  Patient has been on BiPAP intermittently throughout the hospitalization.  Nursing reports to me that patient is receiving normal saline at 125 cc an hour yet received a dose of 60 mg of Lasix earlier today.  Patient is exhibiting ample amounts of clear urine output.  Patient is tolerating oral intake.   30 mg of p.o. diltiazem administered with decrease in atrial fibrillation rate to around 100 bpm.  Respiratory status seems to stabilized on BiPAP therapy.  We will discontinue intravenous fluids for now which can then be reviewed by the day team to determine if this is indicated.   Stephanie Elk  MD [facility]   ADDENDUM (3/10 5:35AM)  Notified by nursing K is 3.1, Mg is 1.1.  of potassium x 2 doses ordered as well as 4 grams of IV magnesium sulfate.  Stephanie Rice

## 2020-07-23 ENCOUNTER — Inpatient Hospital Stay (HOSPITAL_COMMUNITY): Payer: Medicare Other

## 2020-07-23 DIAGNOSIS — Z515 Encounter for palliative care: Secondary | ICD-10-CM | POA: Diagnosis not present

## 2020-07-23 DIAGNOSIS — J441 Chronic obstructive pulmonary disease with (acute) exacerbation: Secondary | ICD-10-CM | POA: Diagnosis not present

## 2020-07-23 DIAGNOSIS — J9622 Acute and chronic respiratory failure with hypercapnia: Secondary | ICD-10-CM | POA: Diagnosis not present

## 2020-07-23 DIAGNOSIS — G9341 Metabolic encephalopathy: Secondary | ICD-10-CM | POA: Diagnosis not present

## 2020-07-23 DIAGNOSIS — Z66 Do not resuscitate: Secondary | ICD-10-CM

## 2020-07-23 DIAGNOSIS — J9621 Acute and chronic respiratory failure with hypoxia: Secondary | ICD-10-CM | POA: Diagnosis not present

## 2020-07-23 DIAGNOSIS — R627 Adult failure to thrive: Secondary | ICD-10-CM | POA: Diagnosis not present

## 2020-07-23 LAB — COMPREHENSIVE METABOLIC PANEL
ALT: 7 U/L (ref 0–44)
AST: 13 U/L — ABNORMAL LOW (ref 15–41)
Albumin: 2 g/dL — ABNORMAL LOW (ref 3.5–5.0)
Alkaline Phosphatase: 46 U/L (ref 38–126)
Anion gap: 7 (ref 5–15)
BUN: 5 mg/dL — ABNORMAL LOW (ref 8–23)
CO2: 38 mmol/L — ABNORMAL HIGH (ref 22–32)
Calcium: 8.3 mg/dL — ABNORMAL LOW (ref 8.9–10.3)
Chloride: 99 mmol/L (ref 98–111)
Creatinine, Ser: 0.32 mg/dL — ABNORMAL LOW (ref 0.44–1.00)
GFR, Estimated: >60 mL/min (ref >60)
Glucose, Bld: 167 mg/dL — ABNORMAL HIGH (ref 70–99)
Potassium: 3.1 mmol/L — ABNORMAL LOW (ref 3.5–5.1)
Sodium: 144 mmol/L (ref 135–145)
Total Bilirubin: 0.2 mg/dL — ABNORMAL LOW (ref 0.3–1.2)
Total Protein: 4.9 g/dL — ABNORMAL LOW (ref 6.5–8.1)

## 2020-07-23 LAB — CBC WITH DIFFERENTIAL/PLATELET
Abs Immature Granulocytes: 0.03 10*3/uL (ref 0.00–0.07)
Basophils Absolute: 0 10*3/uL (ref 0.0–0.1)
Basophils Relative: 0 %
Eosinophils Absolute: 0.1 10*3/uL (ref 0.0–0.5)
Eosinophils Relative: 2 %
HCT: 37 % (ref 36.0–46.0)
Hemoglobin: 11.5 g/dL — ABNORMAL LOW (ref 12.0–15.0)
Immature Granulocytes: 1 %
Lymphocytes Relative: 31 %
Lymphs Abs: 1.4 10*3/uL (ref 0.7–4.0)
MCH: 30.6 pg (ref 26.0–34.0)
MCHC: 31.1 g/dL (ref 30.0–36.0)
MCV: 98.4 fL (ref 80.0–100.0)
Monocytes Absolute: 1 10*3/uL (ref 0.1–1.0)
Monocytes Relative: 22 %
Neutro Abs: 2.1 10*3/uL (ref 1.7–7.7)
Neutrophils Relative %: 44 %
Platelets: 145 10*3/uL — ABNORMAL LOW (ref 150–400)
RBC: 3.76 MIL/uL — ABNORMAL LOW (ref 3.87–5.11)
RDW: 13.8 % (ref 11.5–15.5)
WBC: 4.6 10*3/uL (ref 4.0–10.5)
nRBC: 0 % (ref 0.0–0.2)

## 2020-07-23 LAB — MAGNESIUM: Magnesium: 1.1 mg/dL — ABNORMAL LOW (ref 1.7–2.4)

## 2020-07-23 LAB — GLUCOSE, CAPILLARY
Glucose-Capillary: 127 mg/dL — ABNORMAL HIGH (ref 70–99)
Glucose-Capillary: 137 mg/dL — ABNORMAL HIGH (ref 70–99)
Glucose-Capillary: 111 mg/dL — ABNORMAL HIGH (ref 70–99)
Glucose-Capillary: 78 mg/dL (ref 70–99)

## 2020-07-23 MED ORDER — MAGNESIUM SULFATE 4 GM/100ML IV SOLN
4.0000 g | Freq: Once | INTRAVENOUS | Status: AC
  Administered 2020-07-23: 4 g via INTRAVENOUS
  Filled 2020-07-23: qty 100

## 2020-07-23 MED ORDER — FUROSEMIDE 10 MG/ML IJ SOLN
60.0000 mg | Freq: Once | INTRAMUSCULAR | Status: AC
  Administered 2020-07-23: 60 mg via INTRAVENOUS
  Filled 2020-07-23: qty 6

## 2020-07-23 MED ORDER — POTASSIUM CHLORIDE CRYS ER 20 MEQ PO TBCR
40.0000 meq | EXTENDED_RELEASE_TABLET | ORAL | Status: AC
Start: 2020-07-23 — End: 2020-07-23
  Administered 2020-07-23 (×2): 40 meq via ORAL
  Filled 2020-07-23 (×2): qty 2

## 2020-07-23 NOTE — Plan of Care (Signed)

## 2020-07-23 NOTE — TOC Progression Note (Addendum)
Transition of Care [facility]) - Progression Note    Patient Details  Name: Stephanie Rice MRN: [id number] Date of Birth: 04-19-1956  Transition of Care [facility]) CM/SW Contact  [name], LCSW Phone Number: 07/23/2020, 8:55 AM  Clinical Narrative:   Per [facility] supervisor, [employer] reports they are still waiting on paperwork from pt daughter to move forward with the appeal.  Per daughter on 3/7, she submitted this already.  CSW called daughter Joyce Gross, left message asking her to reach out to [facility] again about what they need.    1300: CSW spoke with Joyce Gross, she worked with the navigator, [place], to resubmit the requested form.  CSW attempted to contact Stephane for update unsuccessfully but Joyce Gross was able to assist in getting CSW in contat with Judeth Cornfield who could only verify that they resubmitted.  She does not have direct access to see where the appeal is at at [facility].    Expected Discharge Plan: Skilled Nursing Facility Barriers to Discharge: Continued Medical Work up  Expected Discharge Plan and Services Expected Discharge Plan: Skilled Nursing Facility     Post Acute Care Choice:  (daughter requesting LTAC) Living arrangements for the past 2 months: Skilled Nursing Facility (was hospitalized at [facility] for 30+ days prior to SNF)                           HH Arranged: PT,OT,Nurse's Aide HH Agency: [facility] Date [facility] Agency Contacted: 07/14/20 Time HH Agency Contacted: 1300 Representative spoke with at [facility] Agency: Kandee Keen   Social Determinants of Health (SDOH) Interventions    Readmission Risk Interventions No flowsheet data found.

## 2020-07-23 NOTE — Progress Notes (Signed)
Physical Therapy Treatment Patient Details Name: Stephanie Rice MRN: [id number] DOB: [birth year] Today's Date: 07/23/2020    History of Present Illness Stephanie Rice is a 65 y.o. female with medical history significant of chronic hypoxic and hypercapnic respiratory failure secondary COPD, on Trelegy home vent PRN +2 L PRN, HTN, recent diagnosed DVT, morbid obesity, IDDM, Dementia, bipolar disorder, presented with altered mentation. episodes requiring BiPAP; episodes of afib    PT Comments    Patient was on BiPAP most of the morning. Once off BiPAP and awake, initiated therapy session. Patient arousable and agreeable to sitting on side of bed to work on strengthening and balance. Patient requires tactile and verbal cues to sequence supine to side to sit. Once sitting, her balance was worse than most recent two sessions and pt's responses more delayed. Developed abdominal pain and requesting return to sidelying after 4 minutes of EOB working on balance (leans to her left). On return to supine noted pt's abdomen distended and firm. RN in to assist with repositioning up in bed and noted pt has been days without moving her bowels.     Follow Up Recommendations  SNF;Supervision/Assistance - 24 hour (noted SNF denial is under appeal)     Equipment Recommendations  Other (comment) (hoyer with hoyer pad (noted daughter does not want w/c and insurance won't pay for another hospital bed))    Recommendations for Other Services       Precautions / Restrictions Precautions Precautions: Fall Precaution Comments: monitor O2, delirium    Mobility  Bed Mobility Overal bed mobility: Needs Assistance Bed Mobility: Rolling;Sidelying to Sit;Sit to Sidelying Rolling: Mod assist Sidelying to sit: Total assist     Sit to sidelying: Mod assist General bed mobility comments: once sidelying, pt assisted with legs over EOB, HOB raised and then assisted to sit EOB with very little effort by pt; pt assisted  with return to bed reporting her stomach hurt and wanted to lie down    Transfers                 General transfer comment: unable to progress beyond EOB  Ambulation/Gait             General Gait Details: unable due to cognition   Stairs             Wheelchair Mobility    Modified Rankin (Stroke Patients Only)       Balance Overall balance assessment: Needs assistance Sitting-balance support: Feet supported;Single extremity supported Sitting balance-Leahy Scale: Poor Sitting balance - Comments: reliant on at least Min A for sitting EOB Postural control: Left lateral lean                                  Cognition Arousal/Alertness: Lethargic (recently came off biPAP) Behavior During Therapy: Flat affect Overall Cognitive Status: History of cognitive impairments - at baseline Area of Impairment: Attention;Following commands;Problem solving;Memory;Safety/judgement;Awareness                 Orientation Level:  (NT) Current Attention Level: Sustained Memory: Decreased short-term memory Following Commands: Follows one step commands with increased time;Follows one step commands inconsistently Safety/Judgement: Decreased awareness of safety;Decreased awareness of deficits Awareness: Intellectual Problem Solving: Slow processing;Decreased initiation;Difficulty sequencing;Requires verbal cues;Requires tactile cues General Comments: agreeable to sit EOB but required incr assist compared to other days      Exercises Other Exercises Other Exercises: in sitting she used LUE  support to maintain midline (without she had LOB to her left). Less interactive than previous sessions    General Comments General comments (skin integrity, edema, etc.): sat EOB ~4 minutes and then reporting stomach hurting (noted to be very firm--RN aware); pt lowered trunk into sidelying with minguard assist with rail and assisted to raise each leg up onto bed       Pertinent Vitals/Pain Pain Assessment: Faces Faces Pain Scale: Hurts little more Pain Location: stomach (tolerated donning socks with no pain) Pain Descriptors / Indicators: Grimacing;Discomfort;Moaning Pain Intervention(s): Repositioned;Monitored during session    Home Living                      Prior Function            PT Goals (current goals can now be found in the care plan section) Acute Rehab PT Goals Patient Stated Goal: unable to state Time For Goal Achievement: 07/30/20 Progress towards PT goals: Not progressing toward goals - comment (more lethargic this session; recently off BiPAP)    Frequency    Min 2X/week      PT Plan Current plan remains appropriate    Co-evaluation              AM-PAC PT "6 Clicks" Mobility   Outcome Measure  Help needed turning from your back to your side while in a flat bed without using bedrails?: A Lot Help needed moving from lying on your back to sitting on the side of a flat bed without using bedrails?: A Lot Help needed moving to and from a bed to a chair (including a wheelchair)?: Total Help needed standing up from a chair using your arms (e.g., wheelchair or bedside chair)?: Total Help needed to walk in hospital room?: Total Help needed climbing 3-5 steps with a railing? : Total 6 Click Score: 8    End of Session Equipment Utilized During Treatment: Oxygen Activity Tolerance: Patient limited by fatigue Patient left: in bed;with call bell/phone within reach;with bed alarm set   PT Visit Diagnosis: Other abnormalities of gait and mobility (R26.89)     Time: 8299-3716 PT Time Calculation (min) (ACUTE ONLY): 19 min  Charges:  $Therapeutic Activity: 8-22 mins                      Jerolyn Center, PT Pager [phone]    Zena Amos 07/23/2020, 12:37 PM

## 2020-07-23 NOTE — Progress Notes (Signed)
Patient ID: Stephanie Rice, female   DOB: [birth year], 65 y.o.   MRN: [id number]  PROGRESS NOTE    Stephanie Rice  GLO:[id number] DOB: 06-03-1955 DOA: 06/30/2020 PCP: [name], PA-C   Brief Narrative:  65 year old female with history of dementia, intellectual disability, COPD/chronic hypoxic respite failure on trilogy vent and 2 L oxygen as needed, recent DVT, obesity, diabetes mellitus type 2, bipolar disorder and hypertension was brought to the ED from SNF due to altered mental status and admitted for acute metabolic encephalopathy in the setting of hypercapnia from acute on chronic respiratory failure and COPD exacerbation and UTI.  She was treated with steroids, antibiotics and BiPAP with improvement in ABG but waxing and waning mental status and there was some concerns regarding behavioral issues.  Psychiatry was consulted and gave recommendations  Hospital course was complicated by acute thrombocytopenia and acute urinary retention.  Hematology was consulted for thrombocytopenia.  Psychiatry also adjusted psych medications which could potentially contribute to thrombocytopenia.  Her platelets have improved.  Oncology is following peripherally as well.  Patient has remained stable over the last several days waiting for insurance approval for SNF.  Daughter has appealed the discharge process, waiting for final decision from insurance.  Assessment & Plan:   Acute metabolic encephalopathy/delirium -multifactorial including hypercapnia, UTI, CAP, underlying dementia, bipolar disorder and intellectual disability.  Reportedly waxing or waning suggesting delirium.  She was also on steroids for COPD exacerbation which could have contributed.   CT head, RPR, B12 and TSH without acute finding -Still intermittently very drowsy -Reorientation and delirium precautions -Minimize sedating meds. -Psychiatry decreased Depakote to 500 mg twice daily in the setting of acute thrombocytopenia.  Patient  often remains very sleepy.  CT and MRI brain unremarkable her Depakote level is within normal range.  Acute on chronic hypoxic and hypercapnic respiratory failure due to COPD exacerbation and CAP -Patient is on trilogy vent and oxygen 2 L as needed at baseline -ABG improved with BiPAP.  Minimal oxygen to keep saturation above 88%.  -Completed 5 days of doxycycline and 3 days of ceftriaxone -Completed course of systemic steroid -Apparently, SNF does not take patients with trilogy vent.  So, she will need BiPAP on discharge.   -Oxygen requirement still fluctuating: Respiratory status worsened overnight requiring BiPAP.  Check chest x-ray this morning.  Atrial fibrillation with RVR -TTE without significant finding. -Patient was tachycardic overnight.  Increase Cardizem to 240 mg daily. -Continue Eliquis  Hypokalemia -Replace.  Repeat a.m. labs  Hypomagnesemia -Replace.  Repeat a.m. labs  Dysphagia: -Dysphagia 2 diet per SLP. -Continue aspiration precautions  UTI -completed antibiotic course with ceftriaxone and Keflex  Hypertension -Blood pressure improving.  Continue Cardizem as above.  Uncontrolled IDDM-2 with hyperglycemia and neuropathy: A1c 7.2%. -Continue Levemir along with CBGs with SSI  History of bipolar disorder -Psychiatry decreased Depakote to 500 mg twice daily (home dose) in the setting of thrombocytopenia. -Patient is also on loxapine 20 mg daily -Outpatient follow-up with psychiatry  History of DVT -Continue Eliquis.  Acute urinary retention:  -Renal ultrasound unremarkable. Foley has been removed and per nursing, she has been voiding without any trouble.   Pressure injury of the right buttock stage II: Not present on admission -Continue local wound care  Thrombocytopenia -Platelets improving.  Hematology signed off.  May follow-up as outpatient with hematology if needed  Obesity -Outpatient follow-up  Generalized conditioning -Palliative  care consultation for goals of care discussion is pending.  Overall prognosis is poor  DVT prophylaxis:  apixaban   Code Status: DNR/DNI Family Communication: None at bedside.  Disposition Plan: Status is: Inpatient  Remains inpatient appropriate because:Inpatient level of care appropriate due to severity of illness.  Insurance declined SNF.  Prior hospitalist provided peer to peer with insurance company on 07/13/2020.  Daughter appealing staff declination.  Waiting for decision   Dispo: The patient is from: SNF              Anticipated d/c is to: SNF versus home health.  Patient is currently medically stable for discharge              Patient currently is medically stable to d/c.   Difficult to place patient No  Consultants: Psychiatry/hematology.  Palliative care evaluation is pending  Procedures: None  Antimicrobials:  Anti-infectives (From admission, onward)   Start     Dose/Rate Route Frequency Ordered Stop   07/03/20 1400  cephALEXin (KEFLEX) capsule 500 mg        500 mg Oral Every 8 hours 07/03/20 0954 07/07/20 1146   06/30/20 2200  doxycycline (VIBRA-TABS) tablet 100 mg        100 mg Oral Every 12 hours 06/30/20 1612 07/05/20 2159   06/30/20 2030  cefTRIAXone (ROCEPHIN) 2 g in sodium chloride 0.9 % 100 mL IVPB  Status:  Discontinued        2 g 200 mL/hr over 30 Minutes Intravenous Daily at bedtime 06/30/20 2006 07/03/20 0954   06/30/20 1800  Ampicillin-Sulbactam (UNASYN) 3 g in sodium chloride 0.9 % 100 mL IVPB  Status:  Discontinued        3 g 200 mL/hr over 30 Minutes Intravenous Every 6 hours 06/30/20 1703 06/30/20 1954       Subjective: Patient seen and examined at bedside.  Poor historian.  Nursing staff reported tachycardia and increasing shortness of breath last night requiring BiPAP and intravenous Lasix.  No overnight fever or vomiting reported. Objective: Vitals:   07/22/20 1153 07/22/20 1949 07/22/20 2230 07/23/20 0515  BP: (!) 177/76 (!) 188/73  (!) 188/61   Pulse: (!) 103  (!) 105 83  Resp: 18 20 20 20   Temp: 97.8 F (36.6 C) 97.9 F (36.6 C)  98 F (36.7 C)  TempSrc: Axillary Axillary  Axillary  SpO2: 99%  98%   Weight:      Height:        Intake/Output Summary (Last 24 hours) at 07/23/2020 0731 Last data filed at 07/23/2020 09/22/2020 Gross per 24 hour  Intake 565.26 ml  Output 3300 ml  Net -2734.74 ml   Filed Weights   07/07/20 1731  Weight: 104.9 kg    Examination:  General exam: Appears chronically ill looking.  Currently on BiPAP.  Poor historian.  Respiratory system: Decreased breath sounds at bases with some crackles Cardiovascular system: Rate controlled, S1-S2 heard Gastrointestinal system: Abdomen is obese, slightly distended, soft and nontender.  Bowel sounds are heard  extremities: Lower extremity edema present bilaterally; no clubbing Central nervous system: Very poor historian; wakes up slightly, hardly participates in any conversation.   No focal neurological deficits.  Moves extremities Skin: No obvious ecchymosis/rash/ Psychiatry: Cannot be assessed because of mental status   Data Reviewed: I have personally reviewed following labs and imaging studies  CBC: Recent Labs  Lab 07/19/20 0128 07/20/20 0228 07/21/20 0100 07/22/20 0227 07/23/20 0050  WBC 7.8 5.3 4.3 3.8* 4.6  NEUTROABS 5.3 3.5 2.2 1.6* 2.1  HGB 12.1 10.3* 10.7* 11.4* 11.5*  HCT 37.7 33.4* 32.5*  37.0 37.0  MCV 95.4 97.1 94.2 99.2 98.4  PLT 138* 113* 126* 145* 145*   Basic Metabolic Panel: Recent Labs  Lab 07/17/20 0257 07/23/20 0050  NA 140 144  K 3.5 3.1*  CL 93* 99  CO2 35* 38*  GLUCOSE 262* 167*  BUN 15 5*  CREATININE 0.33* 0.32*  CALCIUM 8.5* 8.3*  MG  --  1.1*   GFR: Estimated Creatinine Clearance: 82.1 mL/min (A) (by C-G formula based on SCr of 0.32 mg/dL (L)). Liver Function Tests: Recent Labs  Lab 07/23/20 0050  AST 13*  ALT 7  ALKPHOS 46  BILITOT 0.2*  PROT 4.9*  ALBUMIN 2.0*   No results for input(s): LIPASE,  AMYLASE in the last 168 hours. No results for input(s): AMMONIA in the last 168 hours. Coagulation Profile: No results for input(s): INR, PROTIME in the last 168 hours. Cardiac Enzymes: No results for input(s): CKTOTAL, CKMB, CKMBINDEX, TROPONINI in the last 168 hours. BNP (last 3 results) No results for input(s): PROBNP in the last 8760 hours. HbA1C: No results for input(s): HGBA1C in the last 72 hours. CBG: Recent Labs  Lab 07/21/20 1942 07/22/20 0739 07/22/20 1151 07/22/20 1718 07/22/20 1951  GLUCAP 130* 143* 147* 157* 126*   Lipid Profile: No results for input(s): CHOL, HDL, LDLCALC, TRIG, CHOLHDL, LDLDIRECT in the last 72 hours. Thyroid Function Tests: No results for input(s): TSH, T4TOTAL, FREET4, T3FREE, THYROIDAB in the last 72 hours. Anemia Panel: No results for input(s): VITAMINB12, FOLATE, FERRITIN, TIBC, IRON, RETICCTPCT in the last 72 hours. Sepsis Labs: No results for input(s): PROCALCITON, LATICACIDVEN in the last 168 hours.  No results found for this or any previous visit (from the past 240 hour(s)).       Radiology Studies: No results found.      Scheduled Meds: . apixaban  5 mg Oral BID  . atorvastatin  20 mg Oral QHS  . bethanechol  25 mg Oral TID  . Chlorhexidine Gluconate Cloth  6 each Topical Daily  . diltiazem  180 mg Oral Daily  . divalproex  500 mg Oral Q12H  . fluticasone furoate-vilanterol  1 puff Inhalation Daily  . insulin aspart  0-20 Units Subcutaneous TID WC  . insulin aspart  0-5 Units Subcutaneous QHS  . insulin detemir  30 Units Subcutaneous Daily  . loxapine  20 mg Oral QHS  . melatonin  5 mg Oral QHS  . pantoprazole  40 mg Oral Daily  . polyvinyl alcohol  1 drop Both Eyes Q breakfast  . potassium chloride  40 mEq Oral Q4H   Continuous Infusions: . magnesium sulfate bolus IVPB 4 g (07/23/20 0612)          Glade Lloyd, MD [facility] 07/23/2020, 7:31 AM

## 2020-07-23 NOTE — Progress Notes (Signed)
Pt has swollen reddened area to (R) elbow, warm to the touch. Pt denies pain to the area. Md made aware. See new orders.

## 2020-07-23 NOTE — Plan of Care (Signed)

## 2020-07-23 NOTE — Consult Note (Addendum)
Consultation Note Date: 07/23/2020   Patient Name: Stephanie Rice  DOB: 1955-08-01  MRN: [id number]  Age / Sex: 65 y.o., female  PCP: Ronnald Collum Referring Physician: Glade Lloyd, MD  Reason for Consultation: Establishing goals of care and Psychosocial/spiritual support  HPI/Patient Profile: 65 y.o. female   admitted on 06/30/2020 with history of dementia, intellectual disability, COPD/chronic hypoxic respite failure on trilogy vent and 2 L oxygen as needed, recent DVT, obesity, diabetes mellitus type 2, bipolar disorder and hypertension was brought to the ED from SNF due to altered mental status and admitted for acute metabolic encephalopathy in the setting of hypercapnia from acute on chronic respiratory failure and COPD exacerbation and UTI.    She was treated with steroids, antibiotics and BiPAP with improvement in ABG but waxing and waning mental status and there was some concerns regarding behavioral issues.  Psychiatry was consulted and gave recommendations  Hospital course was complicated by acute thrombocytopenia and acute urinary retention.  Hematology was consulted for thrombocytopenia.  Psychiatry also adjusted psych medications which could potentially contribute to thrombocytopenia.  Her platelets have improved.  Oncology is following peripherally as well.  Patient has remained stable over the last several days waiting for insurance approval for SNF.  Daughter has appealed the discharge process, waiting for final decision from insurance.  Family face treatment option decisions, advanced directive decisions and anticipatory care needs.    Clinical Assessment and Goals of Care:   This NP Lorinda Creed reviewed medical records, received report from team, assessed the patient and then meet at the patient's bedside and discussed with daughter/ Stephanie Rice  by phone to discuss diagnosis,  prognosis, GOC, EOL wishes disposition and options.   Concept of Palliative Care was introduced as specialized medical care for people and their families living with serious illness.  If focuses on providing relief from the symptoms and stress of a serious illness.  The goal is to improve quality of life for both the patient and the family.   Values and goals of care important to patient and family were attempted to be elicited.  Created space and opportunity for  family to explore thoughts and feelings regarding current medical situation.  Patient's daughter is very familiar with the details of her mother's medical history.  She tells me that she has been caring for her for many years, and that she is the   next of kin.  Patient's husband died many years ago.  She does have documents including a MOST form and will bring them in for scanning.  Patient has had CAP services in the home prior to recent hospitalizations and hopes to reactivate that if patient returns home.   A  discussion was had today regarding advanced directives.  Concepts specific to code status, artifical feeding and hydration, continued IV antibiotics and rehospitalization was had.  The difference between a aggressive medical intervention path  and a palliative comfort care path for this patient at this time was had.    MOST form discussed.  She tells me that this form has been completed by her PCP in the past and will bring to the hospital for scanning.  Comfort and dignity are a priority to care, and at this time family is open to all offered and available medical interventions to prolong life.   Questions and concerns addressed.  Patient  encouraged to call with questions or concerns.     PMT will continue to support holistically.         Daughter/ Stephanie DeutscherKay Rice tells me that she has documents and will bring to the hospital for scanning.      SUMMARY OF RECOMMENDATIONS    Code Status/Advance Care  Planning:  DNR   Palliative Prophylaxis:   Aspiration, Bowel Regimen, Delirium Protocol, Frequent Pain Assessment and Oral Care  Additional Recommendations (Limitations, Scope, Preferences):  Full Scope Treatment  Psycho-social/Spiritual:   Desire for further Chaplaincy support: no -decleined  Additional Recommendations: Education on Hospice  Prognosis:   Long term prognosis is poor 2/2 to multiple co-morbid ites.  Prognosis will be impacted by desire for life prolonging measures.  Discharge Planning: Recommend outpatient community-based palliative services to follow on discharge.   To Be Determined      Primary Diagnoses: Present on Admission: . COPD (chronic obstructive pulmonary disease) (HCC)   I have reviewed the medical record, interviewed the patient and family, and examined the patient. The following aspects are pertinent.  Past Medical History:  Diagnosis Date  . Arthritis   . Barrett esophagus   . Bipolar 1 disorder (HCC)   . COPD (chronic obstructive pulmonary disease) (HCC)   . Dementia (HCC)   . Diabetes mellitus without complication (HCC)   . GERD (gastroesophageal reflux disease)    Social History   Socioeconomic History  . Marital status: Widowed    Spouse name: Not on file  . Number of children: Not on file  . Years of education: Not on file  . Highest education level: Not on file  Occupational History  . Not on file  Tobacco Use  . Smoking status: Former Games developermoker  . Smokeless tobacco: Never Used  Substance and Sexual Activity  . Alcohol use: No  . Drug use: No  . Sexual activity: Not on file  Other Topics Concern  . Not on file  Social History Narrative  . Not on file   Social Determinants of Health   Financial Resource Strain: Not on file  Food Insecurity: Not on file  Transportation Needs: Not on file  Physical Activity: Not on file  Stress: Not on file  Social Connections: Not on file   Family History  Problem Relation  Age of Onset  . Dementia Other   . Diabetes Other    Scheduled Meds: . apixaban  5 mg Oral BID  . atorvastatin  20 mg Oral QHS  . bethanechol  25 mg Oral TID  . Chlorhexidine Gluconate Cloth  6 each Topical Daily  . diltiazem  180 mg Oral Daily  . divalproex  500 mg Oral Q12H  . fluticasone furoate-vilanterol  1 puff Inhalation Daily  . furosemide  60 mg Intravenous Once  . insulin aspart  0-20 Units Subcutaneous TID WC  . insulin aspart  0-5 Units Subcutaneous QHS  . insulin detemir  30 Units Subcutaneous Daily  . loxapine  20 mg Oral QHS  . melatonin  5 mg Oral QHS  . pantoprazole  40 mg Oral Daily  . polyvinyl alcohol  1 drop Both Eyes Q breakfast  Continuous Infusions: PRN Meds:.acetaminophen, albuterol, clonazepam, diltiazem, polyethylene glycol, Resource ThickenUp Clear, senna-docusate, traMADol Medications Prior to Admission:  Prior to Admission medications   Medication Sig Start Date End Date Taking? Authorizing Provider  albuterol (PROVENTIL HFA;VENTOLIN HFA) 108 (90 Base) MCG/ACT inhaler Inhale 2 puffs into the lungs every 6 (six) hours as needed for wheezing or shortness of breath.   Yes [provider]  albuterol (PROVENTIL) (2.5 MG/3ML) 0.083% nebulizer solution Take 2.5 mg by nebulization every 4 (four) hours as needed for wheezing or shortness of breath.   Yes [provider]  apixaban (ELIQUIS) 5 MG TABS tablet Take 5 mg by mouth every 12 (twelve) hours.   Yes [provider]  cloNIDine (CATAPRES - DOSED IN MG/24 HR) 0.3 mg/24hr patch Place 0.3 mg onto the skin every Tuesday.   Yes [provider]  insulin glargine (LANTUS) 100 UNIT/ML injection Inject 5 Units into the skin at bedtime.   Yes [provider]  Carboxymethylcellulose Sodium 1 % GEL Place 1 drop into both eyes at bedtime. Patient not taking: Reported on 07/02/2020    [provider]  cyclobenzaprine (FLEXERIL) 5 MG tablet Take 5 mg by mouth 3 (three)  times daily as needed for muscle spasms. Patient not taking: Reported on 07/02/2020    [provider]  gabapentin (NEURONTIN) 300 MG capsule Take 1 capsule (300 mg total) by mouth 3 (three) times daily. Patient not taking: Reported on 07/02/2020 02/11/16   Rai, Delene Ruffini, MD  hydrocortisone 1 % ointment Apply topically 2 (two) times daily. Apply to the rash twice a day until resolved 02/11/16   Rai, Ripudeep K, MD  insulin detemir (LEVEMIR) 100 UNIT/ML injection Inject 20 Units into the skin daily.  Patient not taking: Reported on 07/02/2020    [provider]  insulin lispro (HUMALOG) 100 UNIT/ML injection Inject 0-0.09 mLs (0-9 Units total) into the skin 3 (three) times daily before meals. Sliding scale CBG 70 - 120: 0 units CBG 121 - 150: 1 unit,  CBG 151 - 200: 2 units,  CBG 201 - 250: 3 units,  CBG 251 - 300: 5 units,  CBG 301 - 350: 7 units,  CBG 351 - 400: 9 units   CBG > 400: 9 units and notify your MD  Can substitute vial with flex pen if cheaper Patient not taking: Reported on 07/02/2020 02/11/16   Rai, Delene Ruffini, MD  lidocaine (XYLOCAINE) 5 % ointment Apply 1 application topically daily as needed for irritation.  Patient not taking: Reported on 07/02/2020 10/02/15   [provider]  loxapine (LOXITANE) 10 MG capsule Take 10 mg by mouth at bedtime.    [provider]  magnesium oxide (MAG-OX) 400 MG tablet Take 400 mg by mouth 3 (three) times daily. 05/26/20   [provider]  melatonin 5 MG TABS Take 10 mg by mouth at bedtime.    [provider]  meloxicam (MOBIC) 15 MG tablet Take 15 mg by mouth daily. 05/03/20   [provider]  Memantine HCl-Donepezil HCl (NAMZARIC) 28-10 MG CP24 Take 1 capsule by mouth at bedtime.  Patient not taking: Reported on 07/02/2020    [provider]  metoCLOPramide (REGLAN) 10 MG tablet Take 10 mg by mouth 4 (four) times daily. Patient not taking: Reported on 07/02/2020    [provider]  montelukast (SINGULAIR) 10 MG tablet Take 10 mg by mouth at bedtime. Patient not taking: Reported on 07/02/2020    [provider]  Multiple Vitamins-Minerals (CENTRAVITES 50 PLUS) TABS Take 1 tablet by mouth daily. Patient not taking: Reported on 07/02/2020    [provider]  niacin (NIASPAN) 500 MG CR tablet Take 500 mg by mouth every morning. Patient not taking: Reported on 07/02/2020 11/26/15   [provider]  omeprazole (PRILOSEC) 40 MG capsule Take 40 mg by mouth 2 (two) times daily.    [provider]  OXYGEN Inhale 2 L into the lungs continuous.     [provider]  polyvinyl alcohol (LIQUIFILM TEARS) 1.4 % ophthalmic solution Place 1 drop into both eyes every morning. Patient not taking: Reported on 07/02/2020    [provider]  primidone (MYSOLINE) 50 MG tablet Take 50 mg by mouth every morning.  Patient not taking: Reported on 07/02/2020    [provider]  sertraline (ZOLOFT) 100 MG tablet Take 150 mg by mouth at bedtime. Patient not taking: Reported on 07/02/2020    [provider]  SSD 1 % cream Apply 1 application topically daily as needed (for wound).  01/07/16   [provider]  topiramate (TOPAMAX) 100 MG tablet Take 100-200 mg by mouth 2 (two) times daily. 100mg  in the AM and 200mg  QHS  Patient not taking: Reported on 07/02/2020    [provider]  Valproate Sodium (DEPAKENE) 250 MG/5ML SOLN solution Take 250 mg by mouth 4 (four) times daily. 11/23/15 11/22/16  [provider]  Vitamin D, Ergocalciferol, (DRISDOL) 1.25 MG (50000 UNIT) CAPS capsule Take 50,000 Units by mouth once a week. 03/27/20   [provider]   Allergies  Allergen Reactions  . Ciprofloxacin Hives, Itching and Rash  . Nitrofurantoin Hives, Itching and Rash  . Dextromethorphan-Quinidine Other (See Comments)  . Ziprasidone Hcl Other (See Comments)   Review of Systems  Unable to perform  ROS: Mental status change    Physical Exam Constitutional:      Appearance: She is overweight. She is ill-appearing.  Cardiovascular:     Rate and Rhythm: Normal rate.  Skin:    General: Skin is warm and dry.  Neurological:     Mental Status: She is lethargic.     Vital Signs: BP (!) 188/61 (BP Location: Right Leg) Comment: not. nurse  Pulse 97   Temp 98 F (36.7 C) (Axillary)   Resp 20   Ht 5\' 5"  (1.651 m)   Wt 104.9 kg   SpO2 95%   BMI 38.48 kg/m  Pain Scale: PAINAD POSS *See Group Information*: S-Acceptable,Sleep, easy to arouse Pain Score: 0-No pain   SpO2: SpO2: 95 % O2 Device:SpO2: 95 % O2 Flow Rate: .O2 Flow Rate (L/min): 6 L/min  IO: Intake/output summary:   Intake/Output Summary (Last 24 hours) at 07/23/2020 1055 Last data filed at 07/23/2020 Gross per 24 hour  Intake 325.26 ml  Output 3300 ml  Net -2974.74 ml    LBM: Last BM Date: 07/21/20 Baseline Weight: Weight: 104.9 kg Most recent weight: Weight: 104.9 kg     Palliative Assessment/Data:  30 % at best    Discussed with LCSW  Time In: 1600 Time Out: 1710 Time Total: 70 minutes Greater than 50%  of this time was spent counseling and coordinating care related to the above assessment and plan.  Signed by: 09/22/2020, NP   Please contact Palliative Medicine Team phone at [phone] for questions and concerns.  For individual provider: See 09/20/20

## 2020-07-24 ENCOUNTER — Inpatient Hospital Stay (HOSPITAL_COMMUNITY): Payer: Medicare Other

## 2020-07-24 DIAGNOSIS — J9621 Acute and chronic respiratory failure with hypoxia: Secondary | ICD-10-CM | POA: Diagnosis not present

## 2020-07-24 DIAGNOSIS — M7989 Other specified soft tissue disorders: Secondary | ICD-10-CM | POA: Diagnosis not present

## 2020-07-24 DIAGNOSIS — G9341 Metabolic encephalopathy: Secondary | ICD-10-CM | POA: Diagnosis not present

## 2020-07-24 DIAGNOSIS — J441 Chronic obstructive pulmonary disease with (acute) exacerbation: Secondary | ICD-10-CM | POA: Diagnosis not present

## 2020-07-24 LAB — CBC WITH DIFFERENTIAL/PLATELET
Abs Immature Granulocytes: 0.02 10*3/uL (ref 0.00–0.07)
Basophils Absolute: 0 10*3/uL (ref 0.0–0.1)
Basophils Relative: 0 %
Eosinophils Absolute: 0.1 10*3/uL (ref 0.0–0.5)
Eosinophils Relative: 2 %
HCT: 35.9 % — ABNORMAL LOW (ref 36.0–46.0)
Hemoglobin: 11 g/dL — ABNORMAL LOW (ref 12.0–15.0)
Immature Granulocytes: 0 %
Lymphocytes Relative: 29 %
Lymphs Abs: 1.3 10*3/uL (ref 0.7–4.0)
MCH: 29.8 pg (ref 26.0–34.0)
MCHC: 30.6 g/dL (ref 30.0–36.0)
MCV: 97.3 fL (ref 80.0–100.0)
Monocytes Absolute: 0.7 10*3/uL (ref 0.1–1.0)
Monocytes Relative: 15 %
Neutro Abs: 2.5 10*3/uL (ref 1.7–7.7)
Neutrophils Relative %: 54 %
Platelets: 151 10*3/uL (ref 150–400)
RBC: 3.69 MIL/uL — ABNORMAL LOW (ref 3.87–5.11)
RDW: 13.8 % (ref 11.5–15.5)
WBC: 4.6 10*3/uL (ref 4.0–10.5)
nRBC: 0 % (ref 0.0–0.2)

## 2020-07-24 LAB — GLUCOSE, CAPILLARY
Glucose-Capillary: 101 mg/dL — ABNORMAL HIGH (ref 70–99)
Glucose-Capillary: 81 mg/dL (ref 70–99)
Glucose-Capillary: 104 mg/dL — ABNORMAL HIGH (ref 70–99)
Glucose-Capillary: 136 mg/dL — ABNORMAL HIGH (ref 70–99)
Glucose-Capillary: 104 mg/dL — ABNORMAL HIGH (ref 70–99)

## 2020-07-24 LAB — BASIC METABOLIC PANEL
Anion gap: 8 (ref 5–15)
BUN: 8 mg/dL (ref 8–23)
CO2: 45 mmol/L — ABNORMAL HIGH (ref 22–32)
Calcium: 8.5 mg/dL — ABNORMAL LOW (ref 8.9–10.3)
Chloride: 87 mmol/L — ABNORMAL LOW (ref 98–111)
Creatinine, Ser: <0.3 mg/dL — ABNORMAL LOW (ref 0.44–1.00)
Glucose, Bld: 123 mg/dL — ABNORMAL HIGH (ref 70–99)
Potassium: 3.4 mmol/L — ABNORMAL LOW (ref 3.5–5.1)
Sodium: 140 mmol/L (ref 135–145)

## 2020-07-24 LAB — MAGNESIUM: Magnesium: 1.2 mg/dL — ABNORMAL LOW (ref 1.7–2.4)

## 2020-07-24 MED ORDER — DILTIAZEM HCL ER COATED BEADS 240 MG PO CP24
240.0000 mg | ORAL_CAPSULE | Freq: Every day | ORAL | Status: DC
  Administered 2020-07-24 – 2020-08-06 (×14): 240 mg via ORAL
  Filled 2020-07-24 (×14): qty 1

## 2020-07-24 MED ORDER — POTASSIUM CHLORIDE CRYS ER 20 MEQ PO TBCR
40.0000 meq | EXTENDED_RELEASE_TABLET | Freq: Once | ORAL | Status: AC
  Administered 2020-07-25: 40 meq via ORAL
  Filled 2020-07-24: qty 2

## 2020-07-24 MED ORDER — HYDRALAZINE HCL 20 MG/ML IJ SOLN
10.0000 mg | Freq: Four times a day (QID) | INTRAMUSCULAR | Status: DC | PRN
  Administered 2020-07-24 – 2020-07-29 (×2): 10 mg via INTRAVENOUS
  Filled 2020-07-24 (×3): qty 1

## 2020-07-24 MED ORDER — FUROSEMIDE 10 MG/ML IJ SOLN
60.0000 mg | Freq: Once | INTRAMUSCULAR | Status: AC
  Administered 2020-07-24: 60 mg via INTRAVENOUS
  Filled 2020-07-24: qty 6

## 2020-07-24 MED ORDER — MAGNESIUM SULFATE 50 % IJ SOLN
3.0000 g | Freq: Once | INTRAVENOUS | Status: AC
  Administered 2020-07-25: 3 g via INTRAVENOUS
  Filled 2020-07-24: qty 6

## 2020-07-24 NOTE — Progress Notes (Signed)
Patient ID: Seven Marengo, female   DOB: 10/30/1955, 65 y.o.   MRN: [id number]  PROGRESS NOTE    Galen Malkowski  PYK:[id number] DOB: 21-Oct-1955 DOA: 06/30/2020 PCP: [name], PA-C   Brief Narrative:  65 year old female with history of dementia, intellectual disability, COPD/chronic hypoxic respite failure on trilogy vent and 2 L oxygen as needed, recent DVT, obesity, diabetes mellitus type 2, bipolar disorder and hypertension was brought to the ED from SNF due to altered mental status and admitted for acute metabolic encephalopathy in the setting of hypercapnia from acute on chronic respiratory failure and COPD exacerbation and UTI.  She was treated with steroids, antibiotics and BiPAP with improvement in ABG but waxing and waning mental status and there was some concerns regarding behavioral issues.  Psychiatry was consulted and gave recommendations  Hospital course was complicated by acute thrombocytopenia and acute urinary retention.  Hematology was consulted for thrombocytopenia.  Psychiatry also adjusted psych medications which could potentially contribute to thrombocytopenia.  Her platelets have improved.  Oncology is following peripherally as well.  Patient has remained stable over the last several days waiting for insurance approval for SNF.  Daughter has appealed the discharge process, waiting for final decision from insurance.  Assessment & Plan:   Acute metabolic encephalopathy/delirium -multifactorial including hypercapnia, UTI, CAP, underlying dementia, bipolar disorder and intellectual disability.  Reportedly waxing or waning suggesting delirium.  She was also on steroids for COPD exacerbation which could have contributed.   CT head, RPR, B12 and TSH without acute finding -Still intermittently very drowsy -Reorientation and delirium precautions -Minimize sedating meds. -Psychiatry decreased Depakote to 500 mg twice daily in the setting of acute thrombocytopenia.  Patient  often remains very sleepy.  CT and MRI brain unremarkable her Depakote level is within normal range.  Acute on chronic hypoxic and hypercapnic respiratory failure due to COPD exacerbation and CAP -Patient is on trilogy vent and oxygen 2 L as needed at baseline -ABG improved with BiPAP.  Minimal oxygen to keep saturation above 88%.  -Completed 5 days of doxycycline and 3 days of ceftriaxone -Completed course of systemic steroid -Apparently, SNF does not take patients with trilogy vent.  So, she will need BiPAP on discharge.   -Oxygen requirement still fluctuating: Currently on 5 L oxygen via nasal cannula.  Continue BiPAP overnight.  Chest x-ray from 07/23/2020 showed bilateral basilar atelectasis  Atrial fibrillation with RVR -TTE without significant finding. -Patient intermittently tachycardic.  Increase Cardizem to 240 mg daily. -Continue Eliquis  Hypokalemia -Labs pending for today  Hypomagnesemia -Labs pending for today  Dysphagia: -Dysphagia 2 diet per SLP. -Continue aspiration precautions  UTI -completed antibiotic course with ceftriaxone and Keflex  Hypertension -Blood pressure improving.  Continue Cardizem as above.  Uncontrolled IDDM-2 with hyperglycemia and neuropathy: A1c 7.2%. -Continue Levemir along with CBGs with SSI  History of bipolar disorder -Psychiatry decreased Depakote to 500 mg twice daily (home dose) in the setting of thrombocytopenia. -Patient is also on loxapine 20 mg daily -Outpatient follow-up with psychiatry  History of DVT -Continue Eliquis.  Acute urinary retention:  -Renal ultrasound unremarkable. Foley has been removed and per nursing, she has been voiding without any trouble.   Pressure injury of the right buttock stage II: Not present on admission -Continue local wound care  Thrombocytopenia -Resolved.  Hematology signed off.  May follow-up as outpatient with hematology if needed  Obesity -Outpatient  follow-up  Generalized conditioning -Palliative care evaluation appreciated: Recommend outpatient palliative care follow-up.  Overall prognosis is  poor  DVT prophylaxis: apixaban   Code Status: DNR/DNI Family Communication: None at bedside.  Disposition Plan: Status is: Inpatient  Remains inpatient appropriate because:Inpatient level of care appropriate due to severity of illness.  Insurance declined SNF.  Prior hospitalist provided peer to peer with insurance company on 07/13/2020.  Daughter appealing staff declination.  Waiting for decision   Dispo: The patient is from: SNF              Anticipated d/c is to: SNF versus home health.  Patient is currently medically stable for discharge              Patient currently is medically stable to d/c.   Difficult to place patient No  Consultants: Psychiatry/hematology.  Palliative care evaluation is pending  Procedures: None  Antimicrobials:  Anti-infectives (From admission, onward)   Start     Dose/Rate Route Frequency Ordered Stop   07/03/20 1400  cephALEXin (KEFLEX) capsule 500 mg        500 mg Oral Every 8 hours 07/03/20 0954 07/07/20 1146   06/30/20 2200  doxycycline (VIBRA-TABS) tablet 100 mg        100 mg Oral Every 12 hours 06/30/20 1612 07/05/20 2159   06/30/20 2030  cefTRIAXone (ROCEPHIN) 2 g in sodium chloride 0.9 % 100 mL IVPB  Status:  Discontinued        2 g 200 mL/hr over 30 Minutes Intravenous Daily at bedtime 06/30/20 2006 07/03/20 0954   06/30/20 1800  Ampicillin-Sulbactam (UNASYN) 3 g in sodium chloride 0.9 % 100 mL IVPB  Status:  Discontinued        3 g 200 mL/hr over 30 Minutes Intravenous Every 6 hours 06/30/20 1703 06/30/20 1954       Subjective: Patient seen and examined at bedside.  Poor historian.  More awake this morning and answers some basic questions.  No overnight fever, vomiting or seizures reported by nursing staff Objective: Vitals:   07/23/20 2108 07/24/20 0000 07/24/20 0311 07/24/20 0514  BP:  (!) 176/69 (!) 181/73 (!) 158/71 (!) 178/71  Pulse: 89 93  95  Resp: 20 19  20   Temp: 97.9 F (36.6 C)   98 F (36.7 C)  TempSrc: Oral   Oral  SpO2: 95% 97%  97%  Weight:      Height:        Intake/Output Summary (Last 24 hours) at 07/24/2020 0727 Last data filed at 07/23/2020 2100 Gross per 24 hour  Intake 690 ml  Output 1800 ml  Net -1110 ml   Filed Weights   07/07/20 1731  Weight: 104.9 kg    Examination:  General exam: Extremely ill looking deconditioned.  No acute distress.   Respiratory system: Bilateral decreased breath sounds at bases with scattered crackles Cardiovascular system: S1-S2 heard, rate controlled  gastrointestinal system: Abdomen is obese, still slightly distended, soft and nontender.  Normal bowel sounds heard extremities: No cyanosis; bilateral lower extremity edema present Central nervous system: Very poor historian;  More awake this morning and answers some basic questions.  No focal neurological deficits.  Moving extremities Skin: No obvious petechiae/lesions Psychiatry: Could not be evaluated because of mental status  Data Reviewed: I have personally reviewed following labs and imaging studies  CBC: Recent Labs  Lab 07/19/20 0128 07/20/20 0228 07/21/20 0100 07/22/20 0227 07/23/20 0050  WBC 7.8 5.3 4.3 3.8* 4.6  NEUTROABS 5.3 3.5 2.2 1.6* 2.1  HGB 12.1 10.3* 10.7* 11.4* 11.5*  HCT 37.7 33.4* 32.5* 37.0 37.0  MCV 95.4 97.1 94.2 99.2 98.4  PLT 138* 113* 126* 145* 145*   Basic Metabolic Panel: Recent Labs  Lab 07/23/20 0050  NA 144  K 3.1*  CL 99  CO2 38*  GLUCOSE 167*  BUN 5*  CREATININE 0.32*  CALCIUM 8.3*  MG 1.1*   GFR: Estimated Creatinine Clearance: 82.1 mL/min (A) (by C-G formula based on SCr of 0.32 mg/dL (L)). Liver Function Tests: Recent Labs  Lab 07/23/20 0050  AST 13*  ALT 7  ALKPHOS 46  BILITOT 0.2*  PROT 4.9*  ALBUMIN 2.0*   No results for input(s): LIPASE, AMYLASE in the last 168 hours. No results for  input(s): AMMONIA in the last 168 hours. Coagulation Profile: No results for input(s): INR, PROTIME in the last 168 hours. Cardiac Enzymes: No results for input(s): CKTOTAL, CKMB, CKMBINDEX, TROPONINI in the last 168 hours. BNP (last 3 results) No results for input(s): PROBNP in the last 8760 hours. HbA1C: No results for input(s): HGBA1C in the last 72 hours. CBG: Recent Labs  Lab 07/23/20 0807 07/23/20 1200 07/23/20 1638 07/23/20 2150 07/24/20 0428  GLUCAP 127* 137* 111* 78 101*   Lipid Profile: No results for input(s): CHOL, HDL, LDLCALC, TRIG, CHOLHDL, LDLDIRECT in the last 72 hours. Thyroid Function Tests: No results for input(s): TSH, T4TOTAL, FREET4, T3FREE, THYROIDAB in the last 72 hours. Anemia Panel: No results for input(s): VITAMINB12, FOLATE, FERRITIN, TIBC, IRON, RETICCTPCT in the last 72 hours. Sepsis Labs: No results for input(s): PROCALCITON, LATICACIDVEN in the last 168 hours.  No results found for this or any previous visit (from the past 240 hour(s)).       Radiology Studies: DG CHEST PORT 1 VIEW  Result Date: 07/23/2020 CLINICAL DATA:  Shortness of breath. EXAM: PORTABLE CHEST 1 VIEW COMPARISON:  Chest x-ray 07/16/2020, 06/30/2020. FINDINGS: Mediastinum and hilar structures normal. Stable cardiomegaly. No pulmonary venous congestion. Low lung volumes with bibasilar atelectasis/infiltrates again noted without interim change. Small left pleural effusion again noted. No pneumothorax. Degenerative change thoracic spine. Degenerative changes both shoulders. Posttraumatic deformity noted of the right proximal humerus. IMPRESSION: 1. Stable cardiomegaly.  No pulmonary venous congestion. 2. Low lung volumes with bibasilar atelectasis/infiltrates again noted without interim change. Small left pleural effusion again noted. Electronically Signed   By: Maisie Fus  Register   On: 07/23/2020 08:40        Scheduled Meds: . apixaban  5 mg Oral BID  . atorvastatin  20 mg  Oral QHS  . bethanechol  25 mg Oral TID  . Chlorhexidine Gluconate Cloth  6 each Topical Daily  . diltiazem  180 mg Oral Daily  . divalproex  500 mg Oral Q12H  . fluticasone furoate-vilanterol  1 puff Inhalation Daily  . insulin aspart  0-20 Units Subcutaneous TID WC  . insulin aspart  0-5 Units Subcutaneous QHS  . insulin detemir  30 Units Subcutaneous Daily  . loxapine  20 mg Oral QHS  . melatonin  5 mg Oral QHS  . pantoprazole  40 mg Oral Daily  . polyvinyl alcohol  1 drop Both Eyes Q breakfast   Continuous Infusions:         Glade Lloyd, MD [facility] 07/24/2020, 7:27 AM

## 2020-07-24 NOTE — Progress Notes (Signed)
Right upper extremity venous study completed.      Please see CV Proc for preliminary results.   Mayetta Castleman, RVT  

## 2020-07-24 NOTE — TOC Progression Note (Addendum)
Transition of Care [facility]) - Progression Note    Patient Details  Name: Stephanie Rice MRN: [id number] Date of Birth: Dec 22, 1955  Transition of [facility] CM/SW Contact  [name], LCSW Phone Number: 07/24/2020, 11:10 AM  Clinical Narrative:  CSW called [employer] asking for update on status of appeal, spoke to [employer].  She was not able to confirm that they have received the requested form that was resubmitted yesterday.  She indicated that she would "initiate the expedited appeal" and when CSW reminded her that the appeal was initiated on 3/1 she said that she would have to do further research.  She also said that the time frame for a non expedited appeal was not 14 days, it was 30 days.  She could not provide the name of a person at [facility] who was assigned to the appeal but she did say she would contact CSW back with an update later today.  TOC supervisor updated.     Expected Discharge Plan: Skilled Nursing Facility Barriers to Discharge: Continued Medical Work up  Expected Discharge Plan and Services Expected Discharge Plan: Skilled Nursing Facility     Post Acute Care Choice:  (daughter requesting LTAC) Living arrangements for the past 2 months: Skilled Nursing Facility (was hospitalized at [facility] for 30+ days prior to SNF)                           HH Arranged: PT,OT,Nurse's Aide HH Agency: [facility] Date [facility] Agency Contacted: 07/14/20 Time HH Agency Contacted: 1300 Representative spoke with at [facility] Agency: Kandee Keen   Social Determinants of Health (SDOH) Interventions    Readmission Risk Interventions No flowsheet data found.

## 2020-07-24 NOTE — Progress Notes (Signed)
Per MD, OK to come off monitoring to go to Vascular lab.

## 2020-07-24 NOTE — Plan of Care (Signed)

## 2020-07-25 DIAGNOSIS — G9341 Metabolic encephalopathy: Secondary | ICD-10-CM | POA: Diagnosis not present

## 2020-07-25 DIAGNOSIS — J441 Chronic obstructive pulmonary disease with (acute) exacerbation: Secondary | ICD-10-CM | POA: Diagnosis not present

## 2020-07-25 DIAGNOSIS — J9621 Acute and chronic respiratory failure with hypoxia: Secondary | ICD-10-CM | POA: Diagnosis not present

## 2020-07-25 DIAGNOSIS — J9622 Acute and chronic respiratory failure with hypercapnia: Secondary | ICD-10-CM | POA: Diagnosis not present

## 2020-07-25 LAB — CBC WITH DIFFERENTIAL/PLATELET
Abs Immature Granulocytes: 0.03 10*3/uL (ref 0.00–0.07)
Basophils Absolute: 0 10*3/uL (ref 0.0–0.1)
Basophils Relative: 0 %
Eosinophils Absolute: 0.1 10*3/uL (ref 0.0–0.5)
Eosinophils Relative: 2 %
HCT: 34.9 % — ABNORMAL LOW (ref 36.0–46.0)
Hemoglobin: 11.1 g/dL — ABNORMAL LOW (ref 12.0–15.0)
Immature Granulocytes: 1 %
Lymphocytes Relative: 21 %
Lymphs Abs: 1 10*3/uL (ref 0.7–4.0)
MCH: 30.3 pg (ref 26.0–34.0)
MCHC: 31.8 g/dL (ref 30.0–36.0)
MCV: 95.4 fL (ref 80.0–100.0)
Monocytes Absolute: 0.8 10*3/uL (ref 0.1–1.0)
Monocytes Relative: 16 %
Neutro Abs: 3 10*3/uL (ref 1.7–7.7)
Neutrophils Relative %: 60 %
Platelets: 164 10*3/uL (ref 150–400)
RBC: 3.66 MIL/uL — ABNORMAL LOW (ref 3.87–5.11)
RDW: 14.1 % (ref 11.5–15.5)
WBC: 4.9 10*3/uL (ref 4.0–10.5)
nRBC: 0.4 % — ABNORMAL HIGH (ref 0.0–0.2)

## 2020-07-25 LAB — BASIC METABOLIC PANEL
Anion gap: 10 (ref 5–15)
BUN: 11 mg/dL (ref 8–23)
CO2: 40 mmol/L — ABNORMAL HIGH (ref 22–32)
Calcium: 8.3 mg/dL — ABNORMAL LOW (ref 8.9–10.3)
Chloride: 89 mmol/L — ABNORMAL LOW (ref 98–111)
Creatinine, Ser: <0.3 mg/dL — ABNORMAL LOW (ref 0.44–1.00)
Glucose, Bld: 126 mg/dL — ABNORMAL HIGH (ref 70–99)
Potassium: 4.9 mmol/L (ref 3.5–5.1)
Sodium: 139 mmol/L (ref 135–145)

## 2020-07-25 LAB — MAGNESIUM: Magnesium: 1.9 mg/dL (ref 1.7–2.4)

## 2020-07-25 LAB — GLUCOSE, CAPILLARY
Glucose-Capillary: 122 mg/dL — ABNORMAL HIGH (ref 70–99)
Glucose-Capillary: 122 mg/dL — ABNORMAL HIGH (ref 70–99)
Glucose-Capillary: 105 mg/dL — ABNORMAL HIGH (ref 70–99)
Glucose-Capillary: 159 mg/dL — ABNORMAL HIGH (ref 70–99)

## 2020-07-25 MED ORDER — FUROSEMIDE 10 MG/ML IJ SOLN
60.0000 mg | Freq: Once | INTRAMUSCULAR | Status: AC
  Administered 2020-07-25: 60 mg via INTRAVENOUS
  Filled 2020-07-25: qty 6

## 2020-07-25 NOTE — Progress Notes (Signed)
Patient ID: Stephanie Rice, female   DOB: 1955/11/24, 65 y.o.   MRN: [id number]  PROGRESS NOTE    Stephanie Rice  [id number]:[id number] DOB: 12/27/1955 DOA: 06/30/2020 PCP: [name], PA-C   Brief Narrative:  65 year old female with history of dementia, intellectual disability, COPD/chronic hypoxic respite failure on trilogy vent and 2 L oxygen as needed, recent DVT, obesity, diabetes mellitus type 2, bipolar disorder and hypertension was brought to the ED from SNF due to altered mental status and admitted for acute metabolic encephalopathy in the setting of hypercapnia from acute on chronic respiratory failure and COPD exacerbation and UTI.  She was treated with steroids, antibiotics and BiPAP with improvement in ABG but waxing and waning mental status and there was some concerns regarding behavioral issues.  Psychiatry was consulted and gave recommendations.  Hospital course was complicated by acute thrombocytopenia and acute urinary retention.  Hematology was consulted for thrombocytopenia.  Psychiatry also adjusted psych medications which could potentially contribute to thrombocytopenia.  Her platelets have improved.  Oncology is following peripherally as well.  Patient has remained stable over the last several days waiting for insurance approval for SNF.  Daughter has appealed the discharge process, waiting for final decision from insurance.  Assessment & Plan:   Acute metabolic encephalopathy/delirium -multifactorial including hypercapnia, UTI, CAP, underlying dementia, bipolar disorder and intellectual disability.  Reportedly waxing or waning suggesting delirium.  She was also on steroids for COPD exacerbation which could have contributed.   CT head, RPR, B12 and TSH without acute finding -Still intermittently very drowsy -Reorientation and delirium precautions -Minimize sedating meds. -Psychiatry decreased Depakote to 500 mg twice daily in the setting of acute thrombocytopenia.  Patient  often remains very sleepy.  CT and MRI brain unremarkable her Depakote level is within normal range.  Acute on chronic hypoxic and hypercapnic respiratory failure due to COPD exacerbation and CAP -Patient is on trilogy vent and oxygen 2 L as needed at baseline -ABG improved with BiPAP.  Minimal oxygen to keep saturation above 88%.  -Completed 5 days of doxycycline and 3 days of ceftriaxone -Completed course of systemic steroid -Apparently, SNF does not take patients with trilogy vent.  So, she will need BiPAP on discharge.   -Oxygen requirement still fluctuating: Currently still on on 5 L oxygen via nasal cannula during daytime.  Wean off as able.  Patient has received daily intermittent Lasix over the last several days.  Will give 1 dose of Lasix IV again today.  Patient is having good urine output.  Continue BiPAP overnight.  Chest x-ray from 07/23/2020 showed bilateral basilar atelectasis  Atrial fibrillation with RVR -TTE without significant finding. -Heart rate stable currently.  Continue increased dose of Cardizem 240 mg daily -Continue Eliquis  Hypokalemia -Labs pending for today  Hypomagnesemia -Labs pending for today  Dysphagia: -Dysphagia 2 diet per SLP. -Continue aspiration precautions  UTI -completed antibiotic course with ceftriaxone and Keflex  Hypertension -Blood pressure improving.  Continue Cardizem as above.  Uncontrolled IDDM-2 with hyperglycemia and neuropathy: A1c 7.2%. -Continue Levemir along with CBGs with SSI  History of bipolar disorder -Psychiatry decreased Depakote to 500 mg twice daily (home dose) in the setting of thrombocytopenia. -Patient is also on loxapine 20 mg daily -Outpatient follow-up with psychiatry  History of DVT -Continue Eliquis.  Acute urinary retention:  -Renal ultrasound unremarkable. Foley has been removed and per nursing, she has been voiding without any trouble.   Pressure injury of the right buttock stage II: Not  present on  admission -Continue local wound care  Thrombocytopenia -Resolved.  Hematology signed off.  May follow-up as outpatient with hematology if needed  Obesity -Outpatient follow-up  Generalized conditioning -Palliative care evaluation appreciated: Recommend outpatient palliative care follow-up.  Overall prognosis is poor  DVT prophylaxis: apixaban   Code Status: DNR/DNI Family Communication: None at bedside.  Disposition Plan: Status is: Inpatient  Remains inpatient appropriate because:Inpatient level of care appropriate due to severity of illness.  Insurance declined SNF.  Prior hospitalist provided peer to peer with insurance company on 07/13/2020.  Daughter has appealed staff declination.  Waiting for decision   Dispo: The patient is from: SNF              Anticipated d/c is to: SNF versus home health.  Patient is currently medically stable for discharge              Patient currently is medically stable to d/c.   Difficult to place patient No  Consultants: Psychiatry/hematology.  Palliative care evaluation is pending  Procedures: None  Antimicrobials:  Anti-infectives (From admission, onward)   Start     Dose/Rate Route Frequency Ordered Stop   07/03/20 1400  cephALEXin (KEFLEX) capsule 500 mg        500 mg Oral Every 8 hours 07/03/20 0954 07/07/20 1146   06/30/20 2200  doxycycline (VIBRA-TABS) tablet 100 mg        100 mg Oral Every 12 hours 06/30/20 1612 07/05/20 2159   06/30/20 2030  cefTRIAXone (ROCEPHIN) 2 g in sodium chloride 0.9 % 100 mL IVPB  Status:  Discontinued        2 g 200 mL/hr over 30 Minutes Intravenous Daily at bedtime 06/30/20 2006 07/03/20 0954   06/30/20 1800  Ampicillin-Sulbactam (UNASYN) 3 g in sodium chloride 0.9 % 100 mL IVPB  Status:  Discontinued        3 g 200 mL/hr over 30 Minutes Intravenous Every 6 hours 06/30/20 1703 06/30/20 1954       Subjective: Patient seen and examined at bedside.  Poor historian.  Wakes up slightly, does  not participate in conversation much.  No overnight seizures, fever, vomiting reported.  Objective: Vitals:   07/24/20 2030 07/24/20 2100 07/25/20 0036 07/25/20 0436  BP: (!) 165/71   (!) 148/59  Pulse: 89 84  90  Resp: 18 18  15   Temp: 98.1 F (36.7 C)  98.5 F (36.9 C) 98 F (36.7 C)  TempSrc: Oral  Axillary Oral  SpO2:  97%  97%  Weight:      Height:        Intake/Output Summary (Last 24 hours) at 07/25/2020 0745 Last data filed at 07/25/2020 0600 Gross per 24 hour  Intake 780 ml  Output 2700 ml  Net -1920 ml   Filed Weights   07/07/20 1731  Weight: 104.9 kg    Examination:  General exam: No distress.  Currently on 5 L oxygen by nasal cannula.  Extremely deconditioned and ill looking.  Respiratory system: Decreased breath sounds at bases bilaterally with some crackles, no wheezing  cardiovascular system: Rate controlled, S1-S2 heard gastrointestinal system: Abdomen is obese, distended slightly, soft and nontender.  Bowel sounds are heard extremities: Lower extremity edema present bilaterally; no clubbing Central nervous system: Very poor historian; Wakes up slightly, does not participate in conversation much.  No focal neurological deficits.  Moves extremities Skin: No obvious ecchymosis/other lesions  psychiatry: Cannot evaluate because of mental status  Data Reviewed: I have personally reviewed following  labs and imaging studies  CBC: Recent Labs  Lab 07/20/20 0228 07/21/20 0100 07/22/20 0227 07/23/20 0050 07/24/20 0500  WBC 5.3 4.3 3.8* 4.6 4.6  NEUTROABS 3.5 2.2 1.6* 2.1 2.5  HGB 10.3* 10.7* 11.4* 11.5* 11.0*  HCT 33.4* 32.5* 37.0 37.0 35.9*  MCV 97.1 94.2 99.2 98.4 97.3  PLT 113* 126* 145* 145* 151   Basic Metabolic Panel: Recent Labs  Lab 07/23/20 0050 07/24/20 1806  NA 144 140  K 3.1* 3.4*  CL 99 87*  CO2 38* 45*  GLUCOSE 167* 123*  BUN 5* 8  CREATININE 0.32* <0.30*  CALCIUM 8.3* 8.5*  MG 1.1* 1.2*   GFR: CrCl cannot be calculated (This  lab value cannot be used to calculate CrCl because it is not a number: <0.30). Liver Function Tests: Recent Labs  Lab 07/23/20 0050  AST 13*  ALT 7  ALKPHOS 46  BILITOT 0.2*  PROT 4.9*  ALBUMIN 2.0*   No results for input(s): LIPASE, AMYLASE in the last 168 hours. No results for input(s): AMMONIA in the last 168 hours. Coagulation Profile: No results for input(s): INR, PROTIME in the last 168 hours. Cardiac Enzymes: No results for input(s): CKTOTAL, CKMB, CKMBINDEX, TROPONINI in the last 168 hours. BNP (last 3 results) No results for input(s): PROBNP in the last 8760 hours. HbA1C: No results for input(s): HGBA1C in the last 72 hours. CBG: Recent Labs  Lab 07/24/20 0742 07/24/20 1129 07/24/20 1555 07/24/20 2143 07/25/20 0724  GLUCAP 81 104* 136* 104* 122*   Lipid Profile: No results for input(s): CHOL, HDL, LDLCALC, TRIG, CHOLHDL, LDLDIRECT in the last 72 hours. Thyroid Function Tests: No results for input(s): TSH, T4TOTAL, FREET4, T3FREE, THYROIDAB in the last 72 hours. Anemia Panel: No results for input(s): VITAMINB12, FOLATE, FERRITIN, TIBC, IRON, RETICCTPCT in the last 72 hours. Sepsis Labs: No results for input(s): PROCALCITON, LATICACIDVEN in the last 168 hours.  No results found for this or any previous visit (from the past 240 hour(s)).       Radiology Studies: DG CHEST PORT 1 VIEW  Result Date: 07/23/2020 CLINICAL DATA:  Shortness of breath. EXAM: PORTABLE CHEST 1 VIEW COMPARISON:  Chest x-ray 07/16/2020, 06/30/2020. FINDINGS: Mediastinum and hilar structures normal. Stable cardiomegaly. No pulmonary venous congestion. Low lung volumes with bibasilar atelectasis/infiltrates again noted without interim change. Small left pleural effusion again noted. No pneumothorax. Degenerative change thoracic spine. Degenerative changes both shoulders. Posttraumatic deformity noted of the right proximal humerus. IMPRESSION: 1. Stable cardiomegaly.  No pulmonary venous  congestion. 2. Low lung volumes with bibasilar atelectasis/infiltrates again noted without interim change. Small left pleural effusion again noted. Electronically Signed   By: Maisie Fus  Register   On: 07/23/2020 08:40   VAS [place] UPPER EXTREMITY VENOUS DUPLEX  Result Date: 07/24/2020 UPPER VENOUS STUDY  Indications: Swelling Risk Factors: None identified. Comparison Study: No previous Performing Technologist: Clint Guy RVT  Examination Guidelines: A complete evaluation includes B-mode imaging, spectral Doppler, color Doppler, and power Doppler as needed of all accessible portions of each vessel. Bilateral testing is considered an integral part of a complete examination. Limited examinations for reoccurring indications may be performed as noted.  Right Findings: +----------+------------+---------+-----------+----------+-------+ RIGHT     CompressiblePhasicitySpontaneousPropertiesSummary +----------+------------+---------+-----------+----------+-------+ IJV           Full       Yes       Yes                      +----------+------------+---------+-----------+----------+-------+ Subclavian  Full       Yes       Yes                      +----------+------------+---------+-----------+----------+-------+ Axillary      Full       Yes       Yes                      +----------+------------+---------+-----------+----------+-------+ Brachial      Full       Yes       Yes                      +----------+------------+---------+-----------+----------+-------+ Radial        Full                                          +----------+------------+---------+-----------+----------+-------+ Ulnar         Full                                          +----------+------------+---------+-----------+----------+-------+ Cephalic      Full                                          +----------+------------+---------+-----------+----------+-------+ Basilic       Full                                           +----------+------------+---------+-----------+----------+-------+  Summary:  Right: No evidence of deep vein thrombosis in the upper extremity. No evidence of superficial vein thrombosis in the upper extremity.  *See table(s) above for measurements and observations.    Preliminary         Scheduled Meds: . apixaban  5 mg Oral BID  . atorvastatin  20 mg Oral QHS  . bethanechol  25 mg Oral TID  . Chlorhexidine Gluconate Cloth  6 each Topical Daily  . diltiazem  240 mg Oral Daily  . divalproex  500 mg Oral Q12H  . fluticasone furoate-vilanterol  1 puff Inhalation Daily  . insulin aspart  0-20 Units Subcutaneous TID WC  . insulin aspart  0-5 Units Subcutaneous QHS  . insulin detemir  30 Units Subcutaneous Daily  . loxapine  20 mg Oral QHS  . melatonin  5 mg Oral QHS  . pantoprazole  40 mg Oral Daily  . polyvinyl alcohol  1 drop Both Eyes Q breakfast   Continuous Infusions:         Glade LloydKshitiz Zahari Xiang, MD [facility] 07/25/2020, 7:45 AM

## 2020-07-26 DIAGNOSIS — J441 Chronic obstructive pulmonary disease with (acute) exacerbation: Secondary | ICD-10-CM | POA: Diagnosis not present

## 2020-07-26 DIAGNOSIS — G9341 Metabolic encephalopathy: Secondary | ICD-10-CM | POA: Diagnosis not present

## 2020-07-26 LAB — GLUCOSE, CAPILLARY
Glucose-Capillary: 105 mg/dL — ABNORMAL HIGH (ref 70–99)
Glucose-Capillary: 122 mg/dL — ABNORMAL HIGH (ref 70–99)
Glucose-Capillary: 141 mg/dL — ABNORMAL HIGH (ref 70–99)
Glucose-Capillary: 119 mg/dL — ABNORMAL HIGH (ref 70–99)

## 2020-07-26 LAB — BASIC METABOLIC PANEL
Anion gap: 5 (ref 5–15)
BUN: 8 mg/dL (ref 8–23)
CO2: 48 mmol/L — ABNORMAL HIGH (ref 22–32)
Calcium: 8.5 mg/dL — ABNORMAL LOW (ref 8.9–10.3)
Chloride: 86 mmol/L — ABNORMAL LOW (ref 98–111)
Creatinine, Ser: 0.33 mg/dL — ABNORMAL LOW (ref 0.44–1.00)
GFR, Estimated: >60 mL/min (ref >60)
Glucose, Bld: 96 mg/dL (ref 70–99)
Potassium: 3.1 mmol/L — ABNORMAL LOW (ref 3.5–5.1)
Sodium: 139 mmol/L (ref 135–145)

## 2020-07-26 LAB — MAGNESIUM: Magnesium: 1.5 mg/dL — ABNORMAL LOW (ref 1.7–2.4)

## 2020-07-26 MED ORDER — FUROSEMIDE 10 MG/ML IJ SOLN
60.0000 mg | Freq: Once | INTRAMUSCULAR | Status: AC
  Administered 2020-07-26: 60 mg via INTRAVENOUS
  Filled 2020-07-26: qty 6

## 2020-07-26 MED ORDER — MAGNESIUM SULFATE 2 GM/50ML IV SOLN
2.0000 g | Freq: Once | INTRAVENOUS | Status: AC
  Administered 2020-07-26: 2 g via INTRAVENOUS
  Filled 2020-07-26: qty 50

## 2020-07-26 MED ORDER — POTASSIUM CHLORIDE CRYS ER 20 MEQ PO TBCR
40.0000 meq | EXTENDED_RELEASE_TABLET | ORAL | Status: AC
  Administered 2020-07-26 (×2): 40 meq via ORAL
  Filled 2020-07-26 (×2): qty 2

## 2020-07-26 NOTE — Progress Notes (Signed)
Patient ID: Stephanie Rice, female   DOB: Apr 28, 1956, 65 y.o.   MRN: [id number]  PROGRESS NOTE    Stephanie RobertDeborah Nath  NWG:[id number] DOB: Apr 28, 1956 DOA: 06/30/2020 PCP: Coralee Ruduran, Michael R, PA-C   Brief Narrative:  65 year old female with history of dementia, intellectual disability, COPD/chronic hypoxic respite failure on trilogy vent and 2 L oxygen as needed, recent DVT, obesity, diabetes mellitus type 2, bipolar disorder and hypertension was brought to the ED from SNF due to altered mental status and admitted for acute metabolic encephalopathy in the setting of hypercapnia from acute on chronic respiratory failure and COPD exacerbation and UTI.  She was treated with steroids, antibiotics and BiPAP with improvement in ABG but waxing and waning mental status and there was some concerns regarding behavioral issues.  Psychiatry was consulted and gave recommendations.  Hospital course was complicated by acute thrombocytopenia and acute urinary retention.  Hematology was consulted for thrombocytopenia.  Psychiatry also adjusted psych medications which could potentially contribute to thrombocytopenia.  Her platelets have improved.  Oncology is following peripherally as well.  Patient has remained stable over the last several days waiting for insurance approval for SNF.  Daughter has appealed the discharge process, waiting for final decision from insurance.  Assessment & Plan:   Acute metabolic encephalopathy/delirium -multifactorial including hypercapnia, UTI, CAP, underlying dementia, bipolar disorder and intellectual disability.  Reportedly waxing or waning suggesting delirium.  She was also on steroids for COPD exacerbation which could have contributed.   CT head, RPR, B12 and TSH without acute finding -Still intermittently very drowsy -Reorientation and delirium precautions -Minimize sedating meds. -Psychiatry decreased Depakote to 500 mg twice daily in the setting of acute thrombocytopenia.  Patient  often remains very sleepy.  CT and MRI brain unremarkable her Depakote level is within normal range.  Acute on chronic hypoxic and hypercapnic respiratory failure due to COPD exacerbation and CAP -Patient is on trilogy vent and oxygen 2 L as needed at baseline -ABG improved with BiPAP.  Minimal oxygen to keep saturation above 88%.  -Completed 5 days of doxycycline and 3 days of ceftriaxone -Completed course of systemic steroid -Apparently, SNF does not take patients with trilogy vent.  So, she will need BiPAP on discharge.   -Oxygen requirement still fluctuating: Currently on 4L oxygen via nasal cannula during daytime.  Wean off as able.  Patient has received daily intermittent Lasix over the last several days.  Will give 1 dose of Lasix IV again today.  Patient is having good urine output.  Continue BiPAP overnight.  Chest x-ray from 07/23/2020 showed bilateral basilar atelectasis  Atrial fibrillation with RVR -TTE without significant finding. -Heart rate stable currently.  Continue increased dose of Cardizem 240 mg daily -Continue Eliquis  Hypokalemia -Replace.  Repeat a.m. labs  Hypomagnesemia -Replace  Dysphagia: -Dysphagia 2 diet per SLP. -Continue aspiration precautions  UTI -completed antibiotic course with ceftriaxone and Keflex  Hypertension -Blood pressure improving.  Continue Cardizem as above.  IDDM-2 with hyperglycemia and neuropathy: A1c 7.2%. -Continue Levemir along with CBGs with SSI  History of bipolar disorder -Psychiatry decreased Depakote to 500 mg twice daily (home dose) in the setting of thrombocytopenia. -Patient is also on loxapine 20 mg daily -Outpatient follow-up with psychiatry  History of DVT -Continue Eliquis.  Acute urinary retention:  -Renal ultrasound unremarkable. Foley has been removed and per nursing, she has been voiding without any trouble.   Pressure injury of the right buttock stage II: Not present on admission -Continue local  wound care  Thrombocytopenia -Resolved.  Hematology signed off.  May follow-up as outpatient with hematology if needed  Obesity -Outpatient follow-up  Generalized conditioning -Palliative care evaluation appreciated: Recommend outpatient palliative care follow-up.  Overall prognosis is poor  DVT prophylaxis: apixaban   Code Status: DNR/DNI Family Communication: None at bedside.  Disposition Plan: Status is: Inpatient  Remains inpatient appropriate because:Inpatient level of care appropriate due to severity of illness.  Insurance declined SNF.  Prior hospitalist provided peer to peer with insurance company on 07/13/2020.  Daughter has appealed staff declination.  Waiting for decision   Dispo: The patient is from: SNF              Anticipated d/c is to: SNF versus home health.  Patient is currently medically stable for discharge              Patient currently is medically stable to d/c.   Difficult to place patient No  Consultants: Psychiatry/hematology.  Palliative care evaluation is pending  Procedures: None  Antimicrobials:  Anti-infectives (From admission, onward)   Start     Dose/Rate Route Frequency Ordered Stop   07/03/20 1400  cephALEXin (KEFLEX) capsule 500 mg        500 mg Oral Every 8 hours 07/03/20 0954 07/07/20 1146   06/30/20 2200  doxycycline (VIBRA-TABS) tablet 100 mg        100 mg Oral Every 12 hours 06/30/20 1612 07/05/20 2159   06/30/20 2030  cefTRIAXone (ROCEPHIN) 2 g in sodium chloride 0.9 % 100 mL IVPB  Status:  Discontinued        2 g 200 mL/hr over 30 Minutes Intravenous Daily at bedtime 06/30/20 2006 07/03/20 0954   06/30/20 1800  Ampicillin-Sulbactam (UNASYN) 3 g in sodium chloride 0.9 % 100 mL IVPB  Status:  Discontinued        3 g 200 mL/hr over 30 Minutes Intravenous Every 6 hours 06/30/20 1703 06/30/20 1954       Subjective: Patient seen and examined at bedside.  Poor historian.  No overnight fever, vomiting, agitation reported by nursing  staff.   Objective: Vitals:   07/25/20 1328 07/25/20 2114 07/26/20 0540 07/26/20 0805  BP: (!) 165/64 (!) 158/71 (!) 154/65 140/64  Pulse: 85 85 91   Resp: 16 16 14    Temp:  98.6 F (37 C) 98.1 F (36.7 C)   TempSrc:  Oral Oral   SpO2: 96% 96% 98%   Weight:      Height:        Intake/Output Summary (Last 24 hours) at 07/26/2020 0911 Last data filed at 07/26/2020 07/28/2020 Gross per 24 hour  Intake 720 ml  Output 3200 ml  Net -2480 ml   Filed Weights   07/07/20 1731  Weight: 104.9 kg    Examination:  General exam: No acute distress.  On 4 L oxygen via nasal cannula currently.  Extremely deconditioned and ill looking.  Respiratory system: Bilateral increased breath sounds bases with scattered crackles cardiovascular system: S1-S2 heard, rate controlled  gastrointestinal system: Abdomen is obese, distended slightly, soft and nontender.  Normal bowel sounds heard extremities: No cyanosis; mild lower extremity edema present bilaterally.  Right upper extremity edema present as well Central nervous system: Very poor historian; multipartite pertaining conversation.  No focal neurological deficits.  Moving extremities  skin: No obvious petechiae/other lesions  psychiatry: Could not be evaluated because of mental status  Data Reviewed: I have personally reviewed following labs and imaging studies  CBC: Recent Labs  Lab  07/21/20 0100 07/22/20 0227 07/23/20 0050 07/24/20 0500 07/25/20 0800  WBC 4.3 3.8* 4.6 4.6 4.9  NEUTROABS 2.2 1.6* 2.1 2.5 3.0  HGB 10.7* 11.4* 11.5* 11.0* 11.1*  HCT 32.5* 37.0 37.0 35.9* 34.9*  MCV 94.2 99.2 98.4 97.3 95.4  PLT 126* 145* 145* 151 164   Basic Metabolic Panel: Recent Labs  Lab 07/23/20 0050 07/24/20 1806 07/25/20 0800 07/26/20 0438  NA 144 140 139 139  K 3.1* 3.4* 4.9 3.1*  CL 99 87* 89* 86*  CO2 38* 45* 40* 48*  GLUCOSE 167* 123* 126* 96  BUN 5* 8 11 8   CREATININE 0.32* <0.30* <0.30* 0.33*  CALCIUM 8.3* 8.5* 8.3* 8.5*  MG 1.1*  1.2* 1.9 1.5*   GFR: Estimated Creatinine Clearance: 82.1 mL/min (A) (by C-G formula based on SCr of 0.33 mg/dL (L)). Liver Function Tests: Recent Labs  Lab 07/23/20 0050  AST 13*  ALT 7  ALKPHOS 46  BILITOT 0.2*  PROT 4.9*  ALBUMIN 2.0*   No results for input(s): LIPASE, AMYLASE in the last 168 hours. No results for input(s): AMMONIA in the last 168 hours. Coagulation Profile: No results for input(s): INR, PROTIME in the last 168 hours. Cardiac Enzymes: No results for input(s): CKTOTAL, CKMB, CKMBINDEX, TROPONINI in the last 168 hours. BNP (last 3 results) No results for input(s): PROBNP in the last 8760 hours. HbA1C: No results for input(s): HGBA1C in the last 72 hours. CBG: Recent Labs  Lab 07/25/20 0724 07/25/20 1134 07/25/20 1702 07/25/20 2128 07/26/20 0723  GLUCAP 122* 122* 105* 159* 105*   Lipid Profile: No results for input(s): CHOL, HDL, LDLCALC, TRIG, CHOLHDL, LDLDIRECT in the last 72 hours. Thyroid Function Tests: No results for input(s): TSH, T4TOTAL, FREET4, T3FREE, THYROIDAB in the last 72 hours. Anemia Panel: No results for input(s): VITAMINB12, FOLATE, FERRITIN, TIBC, IRON, RETICCTPCT in the last 72 hours. Sepsis Labs: No results for input(s): PROCALCITON, LATICACIDVEN in the last 168 hours.  No results found for this or any previous visit (from the past 240 hour(s)).       Radiology Studies: VAS 07/28/20 UPPER EXTREMITY VENOUS DUPLEX  Result Date: 07/25/2020 UPPER VENOUS STUDY  Indications: Swelling Risk Factors: None identified. Comparison Study: No previous Performing Technologist: 09/24/2020 RVT  Examination Guidelines: A complete evaluation includes B-mode imaging, spectral Doppler, color Doppler, and power Doppler as needed of all accessible portions of each vessel. Bilateral testing is considered an integral part of a complete examination. Limited examinations for reoccurring indications may be performed as noted.  Right Findings:  +----------+------------+---------+-----------+----------+-------+ RIGHT     CompressiblePhasicitySpontaneousPropertiesSummary +----------+------------+---------+-----------+----------+-------+ IJV           Full       Yes       Yes                      +----------+------------+---------+-----------+----------+-------+ Subclavian    Full       Yes       Yes                      +----------+------------+---------+-----------+----------+-------+ Axillary      Full       Yes       Yes                      +----------+------------+---------+-----------+----------+-------+ Brachial      Full       Yes       Yes                      +----------+------------+---------+-----------+----------+-------+  Radial        Full                                          +----------+------------+---------+-----------+----------+-------+ Ulnar         Full                                          +----------+------------+---------+-----------+----------+-------+ Cephalic      Full                                          +----------+------------+---------+-----------+----------+-------+ Basilic       Full                                          +----------+------------+---------+-----------+----------+-------+  Summary:  Right: No evidence of deep vein thrombosis in the upper extremity. No evidence of superficial vein thrombosis in the upper extremity.  *See table(s) above for measurements and observations.  Diagnosing physician: Coral Else MD Electronically signed by Coral Else MD on 07/25/2020 at 4:19:43 PM.    Final         Scheduled Meds: . apixaban  5 mg Oral BID  . atorvastatin  20 mg Oral QHS  . bethanechol  25 mg Oral TID  . Chlorhexidine Gluconate Cloth  6 each Topical Daily  . diltiazem  240 mg Oral Daily  . divalproex  500 mg Oral Q12H  . fluticasone furoate-vilanterol  1 puff Inhalation Daily  . insulin aspart  0-20 Units Subcutaneous TID WC   . insulin aspart  0-5 Units Subcutaneous QHS  . insulin detemir  30 Units Subcutaneous Daily  . loxapine  20 mg Oral QHS  . melatonin  5 mg Oral QHS  . pantoprazole  40 mg Oral Daily  . polyvinyl alcohol  1 drop Both Eyes Q breakfast  . potassium chloride  40 mEq Oral Q4H   Continuous Infusions: . magnesium sulfate bolus IVPB 2 g (07/26/20 0816)          Glade Lloyd, MD [facility] 07/26/2020, 9:11 AM

## 2020-07-26 NOTE — Progress Notes (Signed)
Patient refused to wear bipap tonight. I tried to talk with pt to wear just for a few hours and she still refused.

## 2020-07-27 DIAGNOSIS — G9341 Metabolic encephalopathy: Secondary | ICD-10-CM | POA: Diagnosis not present

## 2020-07-27 DIAGNOSIS — J441 Chronic obstructive pulmonary disease with (acute) exacerbation: Secondary | ICD-10-CM | POA: Diagnosis not present

## 2020-07-27 DIAGNOSIS — J9621 Acute and chronic respiratory failure with hypoxia: Secondary | ICD-10-CM | POA: Diagnosis not present

## 2020-07-27 LAB — GLUCOSE, CAPILLARY
Glucose-Capillary: 206 mg/dL — ABNORMAL HIGH (ref 70–99)
Glucose-Capillary: 172 mg/dL — ABNORMAL HIGH (ref 70–99)
Glucose-Capillary: 178 mg/dL — ABNORMAL HIGH (ref 70–99)
Glucose-Capillary: 197 mg/dL — ABNORMAL HIGH (ref 70–99)

## 2020-07-27 LAB — BASIC METABOLIC PANEL
Anion gap: 7 (ref 5–15)
BUN: 13 mg/dL (ref 8–23)
CO2: 46 mmol/L — ABNORMAL HIGH (ref 22–32)
Calcium: 8.5 mg/dL — ABNORMAL LOW (ref 8.9–10.3)
Chloride: 87 mmol/L — ABNORMAL LOW (ref 98–111)
Creatinine, Ser: 0.62 mg/dL (ref 0.44–1.00)
GFR, Estimated: >60 mL/min (ref >60)
Glucose, Bld: 249 mg/dL — ABNORMAL HIGH (ref 70–99)
Potassium: 3.5 mmol/L (ref 3.5–5.1)
Sodium: 140 mmol/L (ref 135–145)

## 2020-07-27 LAB — MAGNESIUM: Magnesium: 1.8 mg/dL (ref 1.7–2.4)

## 2020-07-27 MED ORDER — POTASSIUM CHLORIDE CRYS ER 20 MEQ PO TBCR
40.0000 meq | EXTENDED_RELEASE_TABLET | Freq: Once | ORAL | Status: AC
  Administered 2020-07-27: 40 meq via ORAL
  Filled 2020-07-27: qty 2

## 2020-07-27 MED ORDER — FUROSEMIDE 10 MG/ML IJ SOLN
60.0000 mg | Freq: Once | INTRAMUSCULAR | Status: AC
  Administered 2020-07-27: 60 mg via INTRAVENOUS
  Filled 2020-07-27: qty 6

## 2020-07-27 NOTE — Progress Notes (Signed)
Nutrition Follow-up  DOCUMENTATION CODES:   Not applicable  INTERVENTION:   Encourage PO intake; provide assistance with meals   Magic cup TID with meals, each supplement provides 290 kcal and 9 grams of protein  MVI with minerals daily    NUTRITION DIAGNOSIS:   Increased nutrient needs related to acute illness as evidenced by estimated needs.  Ongoing.   GOAL:   Patient will meet greater than or equal to 90% of their needs  Progressing.   MONITOR:   PO intake,Supplement acceptance  REASON FOR ASSESSMENT:   Consult Assessment of nutrition requirement/status  ASSESSMENT:   Patient with PMH significant for chronic hypoxic and hypercapnic respiratory failure 2/2 to COPD, HTN, recent diagnosis of DVT, DM, dementia, and bipolar disorder. Presents this admission with COPD exacerbation.  Pt remains on 4L O2 via [place] with BiPAP at night.  Pt awaiting SNF placement after insurance approval.  Noted palliative care following, poor prognosis per MD.   PO intake remains variable 25-100%; pt at 100% of Breakfast this am.  Pt awake and alert but unable to answer any questions.   Medications reviewed and include: SSI, levemir Labs reviewed:  CBG's: 119-206   Diet Order:   Diet Order            DIET DYS 2 Room service appropriate? No; Fluid consistency: Honey Thick; Fluid restriction: 1500 mL Fluid  Diet effective now                 EDUCATION NEEDS:   Not appropriate for education at this time  Skin:  Skin Assessment: Skin Integrity Issues: Skin Integrity Issues:: Stage II Stage II: R buttocks noted on 3/8  Last BM:  3/14 large x 2; type 7  Height:   Ht Readings from Last 1 Encounters:  07/07/20 5\' 5"  (1.651 m)    Weight:   Wt Readings from Last 1 Encounters:  07/07/20 104.9 kg    Ideal Body Weight:     BMI:  Body mass index is 38.48 kg/m.  Estimated Nutritional Needs:   Kcal:  1800-2000 kcal  Protein:  90-105 grams  Fluid:  >/= 1.8  L/day  07/09/20., RD, LDN, CNSC See [facility] for contact information

## 2020-07-27 NOTE — Progress Notes (Signed)
Patient ID: Stephanie Rice, female   DOB: 1956-03-21, 65 y.o.   MRN: [id number]  PROGRESS NOTE    Tannie Koskela  [id number]:[id number] DOB: 12-31-1955 DOA: 06/30/2020 PCP: [name], PA-C   Brief Narrative:  65 year old female with history of dementia, intellectual disability, COPD/chronic hypoxic respite failure on trilogy vent and 2 L oxygen as needed, recent DVT, obesity, diabetes mellitus type 2, bipolar disorder and hypertension was brought to the ED from SNF due to altered mental status and admitted for acute metabolic encephalopathy in the setting of hypercapnia from acute on chronic respiratory failure and COPD exacerbation and UTI.  She was treated with steroids, antibiotics and BiPAP with improvement in ABG but waxing and waning mental status and there was some concerns regarding behavioral issues.  Psychiatry was consulted and gave recommendations.  Hospital course was complicated by acute thrombocytopenia and acute urinary retention.  Hematology was consulted for thrombocytopenia.  Psychiatry also adjusted psych medications which could potentially contribute to thrombocytopenia.  Her platelets have improved.  Oncology is following peripherally as well.  Patient has remained stable over the last several days waiting for insurance approval for SNF.  Daughter has appealed the discharge process, waiting for final decision from insurance.  Assessment & Plan:   Acute metabolic encephalopathy/delirium -multifactorial including hypercapnia, UTI, CAP, underlying dementia, bipolar disorder and intellectual disability.  Reportedly waxing or waning suggesting delirium.  She was also on steroids for COPD exacerbation which could have contributed.   CT head, RPR, B12 and TSH without acute finding -Still intermittently very drowsy -Reorientation and delirium precautions -Minimize sedating meds. -Psychiatry decreased Depakote to 500 mg twice daily in the setting of acute thrombocytopenia.  Patient  often remains very sleepy.  CT and MRI brain unremarkable; her Depakote level is within normal range.  Acute on chronic hypoxic and hypercapnic respiratory failure due to COPD exacerbation and CAP -Patient is on trilogy vent and oxygen 2 L as needed at baseline -ABG improved with BiPAP.  Minimal oxygen to keep saturation above 88%.  -Completed 5 days of doxycycline and 3 days of ceftriaxone -Completed course of systemic steroid -Apparently, SNF does not take patients with trilogy vent.  So, she will need BiPAP on discharge.   -Oxygen requirement still fluctuating: Currently still on 4 L oxygen via nasal cannula during daytime.  Wean off as able.  Patient has received daily intermittent Lasix over the last several days.  Will give 1 dose of Lasix IV again today.  Patient is having good urine output.  Continue BiPAP overnight.  Chest x-ray from 07/23/2020 showed bilateral basilar atelectasis  Atrial fibrillation with RVR -TTE without significant finding. -Heart rate stable currently.  Continue increased dose of Cardizem 240 mg daily -Continue Eliquis  Hypokalemia -Improving.  Continue replacement.  Repeat a.m. labs  Hypomagnesemia -Improved  Dysphagia: -Dysphagia 2 diet per SLP. -Continue aspiration precautions  UTI -completed antibiotic course with ceftriaxone and Keflex  Hypertension -Blood pressure improving.  Continue Cardizem as above.  IDDM-2 with hyperglycemia and neuropathy: A1c 7.2%. -Continue Levemir along with CBGs with SSI  History of bipolar disorder -Psychiatry decreased Depakote to 500 mg twice daily (home dose) in the setting of thrombocytopenia. -Patient is also on loxapine 20 mg daily -Outpatient follow-up with psychiatry  History of DVT -Continue Eliquis.  Acute urinary retention:  -Renal ultrasound unremarkable. Foley has been removed and per nursing, she has been voiding without any trouble.   Pressure injury of the right buttock stage II: Not  present on admission -  Continue local wound care  Thrombocytopenia -Resolved.  Hematology signed off.  May follow-up as outpatient with hematology if needed  Obesity -Outpatient follow-up  Generalized conditioning -Palliative care evaluation appreciated: Recommend outpatient palliative care follow-up.  Overall prognosis is poor  DVT prophylaxis: apixaban   Code Status: DNR/DNI Family Communication: None at bedside.  Disposition Plan: Status is: Inpatient  Remains inpatient appropriate because:Inpatient level of care appropriate due to severity of illness.  Insurance declined SNF.  Prior hospitalist provided peer to peer with insurance company on 07/13/2020.  Daughter has appealed staff declination.  Waiting for decision   Dispo: The patient is from: SNF              Anticipated d/c is to: SNF versus home health.  Patient is currently medically stable for discharge              Patient currently is medically stable to d/c.   Difficult to place patient No  Consultants: Psychiatry/hematology.  Palliative care evaluation is pending  Procedures: None  Antimicrobials:  Anti-infectives (From admission, onward)   Start     Dose/Rate Route Frequency Ordered Stop   07/03/20 1400  cephALEXin (KEFLEX) capsule 500 mg        500 mg Oral Every 8 hours 07/03/20 0954 07/07/20 1146   06/30/20 2200  doxycycline (VIBRA-TABS) tablet 100 mg        100 mg Oral Every 12 hours 06/30/20 1612 07/05/20 2159   06/30/20 2030  cefTRIAXone (ROCEPHIN) 2 g in sodium chloride 0.9 % 100 mL IVPB  Status:  Discontinued        2 g 200 mL/hr over 30 Minutes Intravenous Daily at bedtime 06/30/20 2006 07/03/20 0954   06/30/20 1800  Ampicillin-Sulbactam (UNASYN) 3 g in sodium chloride 0.9 % 100 mL IVPB  Status:  Discontinued        3 g 200 mL/hr over 30 Minutes Intravenous Every 6 hours 06/30/20 1703 06/30/20 1954       Subjective: Patient seen and examined at bedside.  Poor historian.  Refused BiPAP overnight  as per nursing staff.  Wakes up slightly, does not participate in conversation much.  No overnight fever or vomiting reported.   Objective: Vitals:   07/26/20 2019 07/27/20 0005 07/27/20 0110 07/27/20 0522  BP: (!) 172/86 (!) 174/82 (!) 170/81 (!) 163/73  Pulse: 84  90 76  Resp: 18   18  Temp: 97.6 F (36.4 C)   97.9 F (36.6 C)  TempSrc: Oral   Oral  SpO2: 97%   98%  Weight:      Height:        Intake/Output Summary (Last 24 hours) at 07/27/2020 0728 Last data filed at 07/27/2020 0351 Gross per 24 hour  Intake --  Output 1650 ml  Net -1650 ml   Filed Weights   07/07/20 1731  Weight: 104.9 kg    Examination:  General exam: On 4 L oxygen via nasal cannula currently.  Extremely deconditioned and ill looking.  No acute distress  Respiratory system: Decreased breath sounds at bases bilaterally with some crackles, no wheezing  cardiovascular system: Rate controlled, S1-S2 heard  gastrointestinal system: Abdomen is obese, mildly distended, soft and nontender.  Bowel sounds are heard extremities: Bilateral lower extremity edema present; no clubbing.  Right upper extremity edema present as well Central nervous system: Very poor historian; wakes up slightly, hardly participates in any conversation.  No focal neurological deficits.  Moves extremities skin: No obvious ecchymosis/lesions  psychiatry:  Cannot assess because of mental status  Data Reviewed: I have personally reviewed following labs and imaging studies  CBC: Recent Labs  Lab 07/21/20 0100 07/22/20 0227 07/23/20 0050 07/24/20 0500 07/25/20 0800  WBC 4.3 3.8* 4.6 4.6 4.9  NEUTROABS 2.2 1.6* 2.1 2.5 3.0  HGB 10.7* 11.4* 11.5* 11.0* 11.1*  HCT 32.5* 37.0 37.0 35.9* 34.9*  MCV 94.2 99.2 98.4 97.3 95.4  PLT 126* 145* 145* 151 164   Basic Metabolic Panel: Recent Labs  Lab 07/23/20 0050 07/24/20 1806 07/25/20 0800 07/26/20 0438 07/27/20 0302  NA 144 140 139 139 140  K 3.1* 3.4* 4.9 3.1* 3.5  CL 99 87* 89* 86*  87*  CO2 38* 45* 40* 48* 46*  GLUCOSE 167* 123* 126* 96 249*  BUN 5* 8 11 8 13   CREATININE 0.32* <0.30* <0.30* 0.33* 0.62  CALCIUM 8.3* 8.5* 8.3* 8.5* 8.5*  MG 1.1* 1.2* 1.9 1.5* 1.8   GFR: Estimated Creatinine Clearance: 82.1 mL/min (by C-G formula based on SCr of 0.62 mg/dL). Liver Function Tests: Recent Labs  Lab 07/23/20 0050  AST 13*  ALT 7  ALKPHOS 46  BILITOT 0.2*  PROT 4.9*  ALBUMIN 2.0*   No results for input(s): LIPASE, AMYLASE in the last 168 hours. No results for input(s): AMMONIA in the last 168 hours. Coagulation Profile: No results for input(s): INR, PROTIME in the last 168 hours. Cardiac Enzymes: No results for input(s): CKTOTAL, CKMB, CKMBINDEX, TROPONINI in the last 168 hours. BNP (last 3 results) No results for input(s): PROBNP in the last 8760 hours. HbA1C: No results for input(s): HGBA1C in the last 72 hours. CBG: Recent Labs  Lab 07/25/20 2128 07/26/20 0723 07/26/20 1137 07/26/20 1628 07/26/20 2021  GLUCAP 159* 105* 122* 141* 119*   Lipid Profile: No results for input(s): CHOL, HDL, LDLCALC, TRIG, CHOLHDL, LDLDIRECT in the last 72 hours. Thyroid Function Tests: No results for input(s): TSH, T4TOTAL, FREET4, T3FREE, THYROIDAB in the last 72 hours. Anemia Panel: No results for input(s): VITAMINB12, FOLATE, FERRITIN, TIBC, IRON, RETICCTPCT in the last 72 hours. Sepsis Labs: No results for input(s): PROCALCITON, LATICACIDVEN in the last 168 hours.  No results found for this or any previous visit (from the past 240 hour(s)).       Radiology Studies: No results found.      Scheduled Meds: . apixaban  5 mg Oral BID  . atorvastatin  20 mg Oral QHS  . bethanechol  25 mg Oral TID  . Chlorhexidine Gluconate Cloth  6 each Topical Daily  . diltiazem  240 mg Oral Daily  . divalproex  500 mg Oral Q12H  . fluticasone furoate-vilanterol  1 puff Inhalation Daily  . insulin aspart  0-20 Units Subcutaneous TID WC  . insulin aspart  0-5 Units  Subcutaneous QHS  . insulin detemir  30 Units Subcutaneous Daily  . loxapine  20 mg Oral QHS  . melatonin  5 mg Oral QHS  . pantoprazole  40 mg Oral Daily  . polyvinyl alcohol  1 drop Both Eyes Q breakfast   Continuous Infusions:         07/28/20, MD [facility] 07/27/2020, 7:28 AM

## 2020-07-27 NOTE — Progress Notes (Signed)
RT note. Pt currently on [place]  at this time, refusing CPAP/ not able to communicate if she wears at home or wants to wear tonight. Dream station in rm if pt. Changes her mind, RN made aware. RT will continue to monitor.

## 2020-07-27 NOTE — Progress Notes (Signed)
Physical Therapy Treatment Patient Details Name: Unice Vantassel MRN: [id number] DOB: 1955/09/09 Today's Date: 07/27/2020    History of Present Illness Jaelani Posa is a 65 y.o. female with medical history significant of chronic hypoxic and hypercapnic respiratory failure secondary COPD, on Trelegy home vent PRN +2 L PRN, HTN, recent diagnosed DVT, morbid obesity, IDDM, Dementia, bipolar disorder, presented with altered mentation. episodes requiring BiPAP; episodes of afib    PT Comments    Patient pleasantly confused and agreeable to sit up to side of bed to eat her lunch (and work on sitting balance and endurance). Patient able to assist with moving into sitting EOB more today. Once sitting, pt with left lean and unable to unload LUE to assist with self-feeding (did hold spoon with Rt hand). Patient urgently began trying to return to sidelying and asked for bedpan for bowel movement. Patient expelled large volume of gas and reported she felt better and maybe did not need to have a bowel movement. Nursing in to assist pt with her meal and further therapy deferred.     Follow Up Recommendations  SNF;Supervision/Assistance - 24 hour (noted SNF denial is under appeal)     Equipment Recommendations  Other (comment) (hoyer with hoyer pad (noted daughter does not want w/c and insurance won't pay for another hospital bed))    Recommendations for Other Services       Precautions / Restrictions Precautions Precautions: Fall Precaution Comments: monitor O2, delirium    Mobility  Bed Mobility Overal bed mobility: Needs Assistance Bed Mobility: Rolling;Sidelying to Sit;Sit to Sidelying Rolling: Min assist Sidelying to sit: Max assist     Sit to sidelying: Mod assist General bed mobility comments: once sidelying, pt able to assist with legs over EOB, HOB remained raised and pt assisted to sit EOB using left elbow to push up/prop and RUE on rail; pt assisted with return to bed controlling  her upper body and assist to raise legs    Transfers                 General transfer comment: unable to progress beyond EOB  Ambulation/Gait             General Gait Details: unable due to cognition   Stairs             Wheelchair Mobility    Modified Rankin (Stroke Patients Only)       Balance Overall balance assessment: Needs assistance Sitting-balance support: Feet supported;Single extremity supported Sitting balance-Leahy Scale: Poor Sitting balance - Comments: reliant on at least Min A for sitting EOB Postural control: Left lateral lean                                  Cognition Arousal/Alertness: Awake/alert Behavior During Therapy: WFL for tasks assessed/performed Overall Cognitive Status: History of cognitive impairments - at baseline Area of Impairment: Attention;Following commands;Problem solving;Memory;Safety/judgement;Awareness                 Orientation Level:  (NT) Current Attention Level: Sustained Memory: Decreased short-term memory Following Commands: Follows one step commands with increased time;Follows one step commands inconsistently Safety/Judgement: Decreased awareness of safety;Decreased awareness of deficits Awareness: Intellectual Problem Solving: Slow processing;Decreased initiation;Difficulty sequencing;Requires verbal cues;Requires tactile cues General Comments: agreeable to sit EOB to eat her lunch      Exercises General Exercises - Lower Extremity Ankle Circles/Pumps: AAROM;Right;Left;Supine;5 reps Other Exercises Other Exercises: bridging x  5 reps with PT stabilizing feet (even with slipper socks); pt able to activate muscles, but unable to clear or scoot hips    General Comments        Pertinent Vitals/Pain Pain Assessment: Faces Faces Pain Scale: No hurt    Home Living Family/patient expects to be discharged to:: Skilled nursing facility               Additional Comments: Pt  came from [place] per chart. Unsure if long term resident or there for short term rehab. Pt unable to clarify answers    Prior Function Level of Independence: Needs assistance      Comments: Unsure of true PLOF as pt poor historian with varying answers   PT Goals (current goals can now be found in the care plan section) Acute Rehab PT Goals Patient Stated Goal: unable to state PT Goal Formulation: Patient unable to participate in goal setting Time For Goal Achievement: 07/30/20 Progress towards PT goals: Progressing toward goals    Frequency    Min 2X/week      PT Plan Current plan remains appropriate    Co-evaluation              AM-PAC PT "6 Clicks" Mobility   Outcome Measure  Help needed turning from your back to your side while in a flat bed without using bedrails?: A Lot Help needed moving from lying on your back to sitting on the side of a flat bed without using bedrails?: A Lot Help needed moving to and from a bed to a chair (including a wheelchair)?: Total Help needed standing up from a chair using your arms (e.g., wheelchair or bedside chair)?: Total Help needed to walk in hospital room?: Total Help needed climbing 3-5 steps with a railing? : Total 6 Click Score: 8    End of Session Equipment Utilized During Treatment: Oxygen Activity Tolerance: Patient limited by fatigue Patient left: in bed;with call bell/phone within reach;with bed alarm set Nurse Communication: Mobility status;Need for lift equipment PT Visit Diagnosis: Other abnormalities of gait and mobility (R26.89)     Time: 8003-4917 PT Time Calculation (min) (ACUTE ONLY): 16 min  Charges:  $Therapeutic Activity: 8-22 mins                      Jerolyn Center, PT Pager [phone]    Zena Amos 07/27/2020, 2:53 PM

## 2020-07-28 DIAGNOSIS — J441 Chronic obstructive pulmonary disease with (acute) exacerbation: Secondary | ICD-10-CM | POA: Diagnosis not present

## 2020-07-28 DIAGNOSIS — G9341 Metabolic encephalopathy: Secondary | ICD-10-CM | POA: Diagnosis not present

## 2020-07-28 DIAGNOSIS — J9621 Acute and chronic respiratory failure with hypoxia: Secondary | ICD-10-CM | POA: Diagnosis not present

## 2020-07-28 LAB — BASIC METABOLIC PANEL
Anion gap: 9 (ref 5–15)
BUN: 20 mg/dL (ref 8–23)
CO2: 42 mmol/L — ABNORMAL HIGH (ref 22–32)
Calcium: 8.4 mg/dL — ABNORMAL LOW (ref 8.9–10.3)
Chloride: 91 mmol/L — ABNORMAL LOW (ref 98–111)
Creatinine, Ser: 0.52 mg/dL (ref 0.44–1.00)
GFR, Estimated: >60 mL/min (ref >60)
Glucose, Bld: 219 mg/dL — ABNORMAL HIGH (ref 70–99)
Potassium: 3.7 mmol/L (ref 3.5–5.1)
Sodium: 142 mmol/L (ref 135–145)

## 2020-07-28 LAB — GLUCOSE, CAPILLARY
Glucose-Capillary: 120 mg/dL — ABNORMAL HIGH (ref 70–99)
Glucose-Capillary: 194 mg/dL — ABNORMAL HIGH (ref 70–99)
Glucose-Capillary: 240 mg/dL — ABNORMAL HIGH (ref 70–99)
Glucose-Capillary: 123 mg/dL — ABNORMAL HIGH (ref 70–99)

## 2020-07-28 LAB — MAGNESIUM: Magnesium: 1.5 mg/dL — ABNORMAL LOW (ref 1.7–2.4)

## 2020-07-28 MED ORDER — FUROSEMIDE 10 MG/ML IJ SOLN
60.0000 mg | Freq: Once | INTRAMUSCULAR | Status: AC
  Administered 2020-07-28: 60 mg via INTRAVENOUS
  Filled 2020-07-28: qty 6

## 2020-07-28 MED ORDER — MAGNESIUM SULFATE 2 GM/50ML IV SOLN
2.0000 g | Freq: Once | INTRAVENOUS | Status: AC
  Administered 2020-07-28: 2 g via INTRAVENOUS
  Filled 2020-07-28: qty 50

## 2020-07-28 NOTE — Plan of Care (Signed)
Pt unable to understand process at this time. Will continue to educate and reorient as needed.

## 2020-07-28 NOTE — Discharge Instructions (Signed)
Information on my medicine - ELIQUIS (apixaban)  This medication education was reviewed with me or my healthcare representative as part of my discharge preparation.  The pharmacist that spoke with me during my hospital stay was:  Jackie Russman, RPH-CPP  Why was Eliquis prescribed for you? Eliquis was prescribed to treat blood clots that may have been found in the veins of your legs (deep vein thrombosis) or in your lungs (pulmonary embolism) and to reduce the risk of them occurring again.  What do You need to know about Eliquis ? Continue Eliquis 5mg TWICE daily.  Eliquis may be taken with or without food.   Try to take the dose about the same time in the morning and in the evening. If you have difficulty swallowing the tablet whole please discuss with your pharmacist how to take the medication safely.  Take Eliquis exactly as prescribed and DO NOT stop taking Eliquis without talking to the doctor who prescribed the medication.  Stopping may increase your risk of developing a new blood clot.  Refill your prescription before you run out.  After discharge, you should have regular check-up appointments with your healthcare provider that is prescribing your Eliquis.    What do you do if you miss a dose? If a dose of ELIQUIS is not taken at the scheduled time, take it as soon as possible on the same day and twice-daily administration should be resumed. The dose should not be doubled to make up for a missed dose.  Important Safety Information A possible side effect of Eliquis is bleeding. You should call your healthcare provider right away if you experience any of the following: ? Bleeding from an injury or your nose that does not stop. ? Unusual colored urine (red or dark brown) or unusual colored stools (red or black). ? Unusual bruising for unknown reasons. ? A serious fall or if you hit your head (even if there is no bleeding).  Some medicines may interact with Eliquis and might  increase your risk of bleeding or clotting while on Eliquis. To help avoid this, consult your healthcare provider or pharmacist prior to using any new prescription or non-prescription medications, including herbals, vitamins, non-steroidal anti-inflammatory drugs (NSAIDs) and supplements.  This website has more information on Eliquis (apixaban): http://www.eliquis.com/eliquis/home   

## 2020-07-28 NOTE — TOC Progression Note (Signed)
Transition of Care [facility]) - Progression Note    Patient Details  Name: Shalamar Plourde MRN: [id number] Date of Birth: [birth year]  Transition of Care [facility]) CM/SW Contact  [name], LCSW Phone Number: 07/28/2020, 9:24 AM  Clinical Narrative: CSW spoke with [facility] for update on SNF authorization appeal.  CSW informed that appeal is still under review and that they have 15 days to make a determination, which should be completed by Friday, 3/18.       Expected Discharge Plan: Skilled Nursing Facility Barriers to Discharge: Continued Medical Work up  Expected Discharge Plan and Services Expected Discharge Plan: Skilled Nursing Facility     Post Acute Care Choice:  (daughter requesting LTAC) Living arrangements for the past 2 months: Skilled Nursing Facility (was hospitalized at [place] for 30+ days prior to SNF)                           HH Arranged: PT,OT,Nurse's Aide HH Agency: [facility] Date [facility] Agency Contacted: 07/14/20 Time HH Agency Contacted: 1300 Representative spoke with at [facility] Agency: Kandee Keen   Social Determinants of Health (SDOH) Interventions    Readmission Risk Interventions No flowsheet data found.

## 2020-07-28 NOTE — Progress Notes (Signed)
Patient ID: Stephanie Rice, female   DOB: [birth year], 65 y.o.   MRN: [id number]  PROGRESS NOTE    Amali Uhls  UXN:[id number] DOB: 1955-06-11 DOA: 06/30/2020 PCP: [name], PA-C   Brief Narrative:  65 year old female with history of dementia, intellectual disability, COPD/chronic hypoxic respite failure on trilogy vent and 2 L oxygen as needed, recent DVT, obesity, diabetes mellitus type 2, bipolar disorder and hypertension was brought to the ED from SNF due to altered mental status and admitted for acute metabolic encephalopathy in the setting of hypercapnia from acute on chronic respiratory failure and COPD exacerbation and UTI.  She was treated with steroids, antibiotics and BiPAP with improvement in ABG but waxing and waning mental status and there was some concerns regarding behavioral issues.  Psychiatry was consulted and gave recommendations.  Hospital course was complicated by acute thrombocytopenia and acute urinary retention.  Hematology was consulted for thrombocytopenia.  Psychiatry also adjusted psych medications which could potentially contribute to thrombocytopenia.  Her platelets have improved.  Oncology is following peripherally as well.  Patient has remained stable over the last several days waiting for insurance approval for SNF.  Daughter has appealed the discharge process, waiting for final decision from insurance.  Assessment & Plan:   Acute metabolic encephalopathy/delirium -multifactorial including hypercapnia, UTI, CAP, underlying dementia, bipolar disorder and intellectual disability.  Reportedly waxing or waning suggesting delirium.  She was also on steroids for COPD exacerbation which could have contributed.   CT head, RPR, B12 and TSH without acute finding -Still intermittently very drowsy -Reorientation and delirium precautions -Minimize sedating meds. -Psychiatry decreased Depakote to 500 mg twice daily in the setting of acute thrombocytopenia.  Patient  often remains very sleepy.  CT and MRI brain unremarkable; her Depakote level is within normal range.  Acute on chronic hypoxic and hypercapnic respiratory failure due to COPD exacerbation and CAP -Patient is on trilogy vent and oxygen 2 L as needed at baseline -ABG improved with BiPAP.  Minimal oxygen to keep saturation above 88%.  -Completed 5 days of doxycycline and 3 days of ceftriaxone -Completed course of systemic steroid -Apparently, SNF does not take patients with trilogy vent.  So, she will need BiPAP on discharge.   -Oxygen requirement still fluctuating: Currently still on 4 L oxygen via nasal cannula during daytime.  Wean off as able.  Patient has received daily intermittent Lasix over the last several days.  Will give 1 dose of Lasix IV again today.  Patient is having good urine output.  Continue BiPAP overnight; intermittently refuses overnight BiPAP.  Chest x-ray from 07/23/2020 showed bilateral basilar atelectasis  Atrial fibrillation with RVR -TTE without significant finding. -Heart rate stable currently.  Continue increased dose of Cardizem 240 mg daily -Continue Eliquis  Hypokalemia -Improving.  Continue replacement.  Repeat a.m. labs  Hypomagnesemia -Replace.  Repeat a.m. labs.  Dysphagia: -Dysphagia 2 diet per SLP. -Continue aspiration precautions  UTI -completed antibiotic course with ceftriaxone and Keflex  Hypertension -Blood pressure improving.  Continue Cardizem as above.  IDDM-2 with hyperglycemia and neuropathy: A1c 7.2%. -Continue Levemir along with CBGs with SSI  History of bipolar disorder -Psychiatry decreased Depakote to 500 mg twice daily (home dose) in the setting of thrombocytopenia. -Patient is also on loxapine 20 mg daily -Outpatient follow-up with psychiatry  History of DVT -Continue Eliquis.  Acute urinary retention:  -Renal ultrasound unremarkable. Foley has been removed and per nursing, she has been voiding without any trouble.    Pressure injury of the  right buttock stage II: Not present on admission -Continue local wound care  Thrombocytopenia -Resolved.  Hematology signed off.  May follow-up as outpatient with hematology if needed  Obesity -Outpatient follow-up  Generalized conditioning -Palliative care evaluation appreciated: Recommend outpatient palliative care follow-up.  Overall prognosis is poor  DVT prophylaxis: apixaban   Code Status: DNR/DNI Family Communication: None at bedside.  Disposition Plan: Status is: Inpatient  Remains inpatient appropriate because:Inpatient level of care appropriate due to severity of illness.  Insurance declined SNF.  Prior hospitalist provided peer to peer with insurance company on 07/13/2020.  Daughter has appealed staff declination.  Waiting for decision   Dispo: The patient is from: SNF              Anticipated d/c is to: SNF versus home health.  Patient is currently medically stable for discharge              Patient currently is medically stable to d/c.   Difficult to place patient No  Consultants: Psychiatry/hematology.  Palliative care evaluation is pending  Procedures: None  Antimicrobials:  Anti-infectives (From admission, onward)   Start     Dose/Rate Route Frequency Ordered Stop   07/03/20 1400  cephALEXin (KEFLEX) capsule 500 mg        500 mg Oral Every 8 hours 07/03/20 0954 07/07/20 1146   06/30/20 2200  doxycycline (VIBRA-TABS) tablet 100 mg        100 mg Oral Every 12 hours 06/30/20 1612 07/05/20 2159   06/30/20 2030  cefTRIAXone (ROCEPHIN) 2 g in sodium chloride 0.9 % 100 mL IVPB  Status:  Discontinued        2 g 200 mL/hr over 30 Minutes Intravenous Daily at bedtime 06/30/20 2006 07/03/20 0954   06/30/20 1800  Ampicillin-Sulbactam (UNASYN) 3 g in sodium chloride 0.9 % 100 mL IVPB  Status:  Discontinued        3 g 200 mL/hr over 30 Minutes Intravenous Every 6 hours 06/30/20 1703 06/30/20 1954       Subjective: Patient seen and examined  at bedside.  Intermittently refused BiPAP overnight as per nursing staff.  No overnight fever, vomiting, chest pain reported.   Objective: Vitals:   07/27/20 0005 07/27/20 0110 07/27/20 0522 07/28/20 0500  BP: (!) 174/82 (!) 170/81 (!) 163/73 (!) 156/50  Pulse:  90 76 85  Resp:   18 18  Temp:   97.9 F (36.6 C) (!) 97.5 F (36.4 C)  TempSrc:   Oral Oral  SpO2:   98% 92%  Weight:      Height:        Intake/Output Summary (Last 24 hours) at 07/28/2020 0724 Last data filed at 07/27/2020 0900 Gross per 24 hour  Intake 240 ml  Output -  Net 240 ml   Filed Weights   07/07/20 1731  Weight: 104.9 kg    Examination:  General exam:  Extremely deconditioned and ill looking.  Currently on 4 L oxygen via nasal cannula.  No acute distress.  Respiratory system: Bilateral decreased breath sounds at bases with basilar crackles cardiovascular system: S1-S2 heard, rate controlled gastrointestinal system: Abdomen is obese, slightly distended, soft and nontender.  Normal bowel sounds are heard extremities: No cyanosis; lower extremity edema present bilaterally.  Right upper extremity edema present as well Central nervous system: Extremely poor historian; more awake this morning, hardly participates in any conversation.  No focal neurological deficits.  Moving extremities skin: No obvious petechiae/rashes  psychiatry: Could not be  assessed because of mental status  Data Reviewed: I have personally reviewed following labs and imaging studies  CBC: Recent Labs  Lab 07/22/20 0227 07/23/20 0050 07/24/20 0500 07/25/20 0800  WBC 3.8* 4.6 4.6 4.9  NEUTROABS 1.6* 2.1 2.5 3.0  HGB 11.4* 11.5* 11.0* 11.1*  HCT 37.0 37.0 35.9* 34.9*  MCV 99.2 98.4 97.3 95.4  PLT 145* 145* 151 164   Basic Metabolic Panel: Recent Labs  Lab 07/24/20 1806 07/25/20 0800 07/26/20 0438 07/27/20 0302 07/28/20 0302  NA 140 139 139 140 142  K 3.4* 4.9 3.1* 3.5 3.7  CL 87* 89* 86* 87* 91*  CO2 45* 40* 48* 46* 42*   GLUCOSE 123* 126* 96 249* 219*  BUN 8 11 8 13 20   CREATININE <0.30* <0.30* 0.33* 0.62 0.52  CALCIUM 8.5* 8.3* 8.5* 8.5* 8.4*  MG 1.2* 1.9 1.5* 1.8 1.5*   GFR: Estimated Creatinine Clearance: 82.1 mL/min (by C-G formula based on SCr of 0.52 mg/dL). Liver Function Tests: Recent Labs  Lab 07/23/20 0050  AST 13*  ALT 7  ALKPHOS 46  BILITOT 0.2*  PROT 4.9*  ALBUMIN 2.0*   No results for input(s): LIPASE, AMYLASE in the last 168 hours. No results for input(s): AMMONIA in the last 168 hours. Coagulation Profile: No results for input(s): INR, PROTIME in the last 168 hours. Cardiac Enzymes: No results for input(s): CKTOTAL, CKMB, CKMBINDEX, TROPONINI in the last 168 hours. BNP (last 3 results) No results for input(s): PROBNP in the last 8760 hours. HbA1C: No results for input(s): HGBA1C in the last 72 hours. CBG: Recent Labs  Lab 07/26/20 2021 07/27/20 0741 07/27/20 1150 07/27/20 1606 07/27/20 2125  GLUCAP 119* 206* 172* 178* 197*   Lipid Profile: No results for input(s): CHOL, HDL, LDLCALC, TRIG, CHOLHDL, LDLDIRECT in the last 72 hours. Thyroid Function Tests: No results for input(s): TSH, T4TOTAL, FREET4, T3FREE, THYROIDAB in the last 72 hours. Anemia Panel: No results for input(s): VITAMINB12, FOLATE, FERRITIN, TIBC, IRON, RETICCTPCT in the last 72 hours. Sepsis Labs: No results for input(s): PROCALCITON, LATICACIDVEN in the last 168 hours.  No results found for this or any previous visit (from the past 240 hour(s)).       Radiology Studies: No results found.      Scheduled Meds: . apixaban  5 mg Oral BID  . atorvastatin  20 mg Oral QHS  . bethanechol  25 mg Oral TID  . Chlorhexidine Gluconate Cloth  6 each Topical Daily  . diltiazem  240 mg Oral Daily  . divalproex  500 mg Oral Q12H  . fluticasone furoate-vilanterol  1 puff Inhalation Daily  . insulin aspart  0-20 Units Subcutaneous TID WC  . insulin aspart  0-5 Units Subcutaneous QHS  . insulin  detemir  30 Units Subcutaneous Daily  . loxapine  20 mg Oral QHS  . melatonin  5 mg Oral QHS  . pantoprazole  40 mg Oral Daily  . polyvinyl alcohol  1 drop Both Eyes Q breakfast   Continuous Infusions:         07/29/20, MD [facility] 07/28/2020, 7:24 AM

## 2020-07-29 DIAGNOSIS — J441 Chronic obstructive pulmonary disease with (acute) exacerbation: Secondary | ICD-10-CM | POA: Diagnosis not present

## 2020-07-29 DIAGNOSIS — F0281 Dementia in other diseases classified elsewhere with behavioral disturbance: Secondary | ICD-10-CM

## 2020-07-29 DIAGNOSIS — G20 Parkinson's disease: Secondary | ICD-10-CM

## 2020-07-29 DIAGNOSIS — L8990 Pressure ulcer of unspecified site, unspecified stage: Secondary | ICD-10-CM | POA: Insufficient documentation

## 2020-07-29 DIAGNOSIS — F0391 Unspecified dementia with behavioral disturbance: Secondary | ICD-10-CM | POA: Diagnosis not present

## 2020-07-29 LAB — CBC WITH DIFFERENTIAL/PLATELET
Abs Immature Granulocytes: 0.04 10*3/uL (ref 0.00–0.07)
Basophils Absolute: 0 10*3/uL (ref 0.0–0.1)
Basophils Relative: 0 %
Eosinophils Absolute: 0.1 10*3/uL (ref 0.0–0.5)
Eosinophils Relative: 1 %
HCT: 35.8 % — ABNORMAL LOW (ref 36.0–46.0)
Hemoglobin: 11.6 g/dL — ABNORMAL LOW (ref 12.0–15.0)
Immature Granulocytes: 1 %
Lymphocytes Relative: 20 %
Lymphs Abs: 1.3 10*3/uL (ref 0.7–4.0)
MCH: 30.8 pg (ref 26.0–34.0)
MCHC: 32.4 g/dL (ref 30.0–36.0)
MCV: 95 fL (ref 80.0–100.0)
Monocytes Absolute: 0.9 10*3/uL (ref 0.1–1.0)
Monocytes Relative: 13 %
Neutro Abs: 4.2 10*3/uL (ref 1.7–7.7)
Neutrophils Relative %: 65 %
Platelets: 165 10*3/uL (ref 150–400)
RBC: 3.77 MIL/uL — ABNORMAL LOW (ref 3.87–5.11)
RDW: 14.2 % (ref 11.5–15.5)
WBC: 6.5 10*3/uL (ref 4.0–10.5)
nRBC: 0 % (ref 0.0–0.2)

## 2020-07-29 LAB — BASIC METABOLIC PANEL
Anion gap: 7 (ref 5–15)
BUN: 20 mg/dL (ref 8–23)
CO2: 44 mmol/L — ABNORMAL HIGH (ref 22–32)
Calcium: 8.6 mg/dL — ABNORMAL LOW (ref 8.9–10.3)
Chloride: 90 mmol/L — ABNORMAL LOW (ref 98–111)
Creatinine, Ser: 0.36 mg/dL — ABNORMAL LOW (ref 0.44–1.00)
GFR, Estimated: >60 mL/min (ref >60)
Glucose, Bld: 159 mg/dL — ABNORMAL HIGH (ref 70–99)
Potassium: 3.5 mmol/L (ref 3.5–5.1)
Sodium: 141 mmol/L (ref 135–145)

## 2020-07-29 LAB — GLUCOSE, CAPILLARY
Glucose-Capillary: 142 mg/dL — ABNORMAL HIGH (ref 70–99)
Glucose-Capillary: 126 mg/dL — ABNORMAL HIGH (ref 70–99)
Glucose-Capillary: 212 mg/dL — ABNORMAL HIGH (ref 70–99)
Glucose-Capillary: 200 mg/dL — ABNORMAL HIGH (ref 70–99)
Glucose-Capillary: 308 mg/dL — ABNORMAL HIGH (ref 70–99)

## 2020-07-29 LAB — MAGNESIUM: Magnesium: 1.6 mg/dL — ABNORMAL LOW (ref 1.7–2.4)

## 2020-07-29 MED ORDER — SALINE SPRAY 0.65 % NA SOLN
2.0000 | NASAL | Status: DC | PRN
  Filled 2020-07-29: qty 44

## 2020-07-29 MED ORDER — POTASSIUM CHLORIDE CRYS ER 20 MEQ PO TBCR
40.0000 meq | EXTENDED_RELEASE_TABLET | Freq: Once | ORAL | Status: AC
  Administered 2020-07-29: 40 meq via ORAL
  Filled 2020-07-29: qty 2

## 2020-07-29 MED ORDER — MAGNESIUM OXIDE 400 (241.3 MG) MG PO TABS
800.0000 mg | ORAL_TABLET | Freq: Once | ORAL | Status: AC
  Administered 2020-07-29: 800 mg via ORAL
  Filled 2020-07-29: qty 2

## 2020-07-29 MED ORDER — DM-GUAIFENESIN ER 30-600 MG PO TB12: 1.0000 | ORAL_TABLET | Freq: Two times a day (BID) | ORAL | Status: DC | PRN

## 2020-07-29 NOTE — Progress Notes (Signed)
PROGRESS NOTE    Stephanie Rice  [id number]:[id number] DOB: 1956/04/29 DOA: 06/30/2020 PCP: [name], PA-C   Brief Narrative:  65 year old female with history of dementia, intellectual disability, COPD/chronic hypoxic respite failure on trilogy vent and 2 L oxygen as needed, recent DVT, obesity, diabetes mellitus type 2, bipolar disorder and hypertension was brought to the ED from SNF due to altered mental status and admitted for acute metabolic encephalopathy in the setting of hypercapnia from acute on chronic respiratory failure and COPD exacerbation and UTI.  She was treated with steroids, antibiotics and BiPAP with improvement in ABG but waxing and waning mental status and there was some concerns regarding behavioral issues.  Psychiatry was consulted and gave recommendations. Hospital course was complicated by acute thrombocytopenia and acute urinary retention.  Hematology was consulted for thrombocytopenia.  Psychiatry also adjusted psych medications which could potentially contribute to thrombocytopenia.  Her platelets have improved.  Oncology is following peripherally as well.  Patient has remained stable over the last several days waiting for insurance approval for SNF.  Daughter has appealed the discharge process, waiting for final decision from insurance.    Assessment & Plan:   Active Problems:   COPD with acute exacerbation (HCC)   COPD (chronic obstructive pulmonary disease) (HCC)   Pressure injury of skin   Acute metabolic encephalopathy/delirium -multifactorial including hypercapnia, UTI, CAP, underlying dementia, bipolar disorder and intellectual disability. Reportedly waxing or waning suggesting delirium. She was also on steroids for COPD exacerbation which could have contributed. CT head, RPR, B12 and TSH without acute finding -Still intermittently very drowsy -Reorientation and delirium precautions -Minimize sedating meds. -Psychiatry decreased Depakote to 500 mg  twice daily in the setting of acute thrombocytopenia. Patient often remains very sleepy. CT and MRI brain unremarkable; her Depakote level is within normal range.  Acute on chronic hypoxic and hypercapnic respiratory failure due to COPD exacerbation and CAP -Patient is on trilogy vent and oxygen 2 L as needed at baseline -Completed 5 days of doxycycline and 3 days of ceftriaxone -Completed course of systemic steroid -Apparently, SNF does not take patients with trilogy vent. So, she will need BiPAP on discharge.  -Intermittently getting IV Lasix.  Will check BNP/ProCal Monitor Bicarb  Chronic atrial fibrillation -TTE without significant finding. -Continue Eliquis twice daily.  Cardizem 240 mg daily  Dysphagia: -Dysphagia 2 diet per SLP. -Continue aspiration precautions  UTI -completed antibiotic course with ceftriaxone and Keflex  Hypertension -Blood pressure improving.  Continue Cardizem as above.  IDDM-2 with hyperglycemia and neuropathy:A1c 7.2%. -Continue Levemir along with CBGs with SSI  History of bipolar disorder -Psychiatry decreased Depakote to 500 mg twice daily (home dose) in the setting of thrombocytopenia. -Patient is also on loxapine 20 mg daily -Outpatient follow-up with psychiatry  History of DVT -Continue Eliquis.  Acute urinary retention: resolved -Renal ultrasound unremarkable. Foley has been removed and per nursing, she has been voiding without any trouble.   Pressure injury of the right buttock stage II: Not present on admission -Continue local wound care  Thrombocytopenia -Resolved. Hematology signed off. May follow-up as outpatient with hematology if needed  Obesity -Outpatient follow-up  Generalized conditioning -Palliative care evaluation appreciated: Recommend outpatient palliative care follow-up.  Overall prognosis is poor   DVT prophylaxis:  apixaban (ELIQUIS) tablet 5 mg  Code Status: DNR Family Communication:     Status is: Inpatient  Remains inpatient appropriate because:Unsafe d/c plan   Dispo: The patient is from: Home  Anticipated d/c is to: SNF              Patient currently is medically stable to d/c.   Difficult to place patient Yes.  TOC team working on placement, currently awaiting appeal decision     Body mass index is 38.48 kg/m.  Pressure Injury 07/21/20 Buttocks Right Stage 2 -  Partial thickness loss of dermis presenting as a shallow open injury with a red, pink wound bed without slough. 1*1 stage 2 pressure injury (Active)  07/21/20 1637  Location: Buttocks  Location Orientation: Right  Staging: Stage 2 -  Partial thickness loss of dermis presenting as a shallow open injury with a red, pink wound bed without slough.  Wound Description (Comments): 1*1 stage 2 pressure injury  Present on Admission: No     Subjective: Overnight patient had episode of agitation requiring Haldol.  This morning she is doing okay answering all the basic questions appropriately.  Denies any complaints.  Review of Systems Otherwise negative except as per HPI, including: General: Denies fever, chills, night sweats or unintended weight loss. Resp: Denies cough, wheezing, shortness of breath. Cardiac: Denies chest pain, palpitations, orthopnea, paroxysmal nocturnal dyspnea. GI: Denies abdominal pain, nausea, vomiting, diarrhea or constipation GU: Denies dysuria, frequency, hesitancy or incontinence MS: Denies muscle aches, joint pain or swelling Neuro: Denies headache, neurologic deficits (focal weakness, numbness, tingling), abnormal gait Psych: Denies anxiety, depression, SI/HI/AVH Skin: Denies new rashes or lesions ID: Denies sick contacts, exotic exposures, travel  Examination:  General exam: Appears calm and comfortable, elderly frail Respiratory system: Clear to auscultation. Respiratory effort normal. Cardiovascular system: S1 & S2 heard, RRR. No JVD, murmurs, rubs,  gallops or clicks. No pedal edema. Gastrointestinal system: Abdomen is nondistended, soft and nontender. No organomegaly or masses felt. Normal bowel sounds heard. Central nervous system: Alert and oriented. No focal neurological deficits. Extremities: Symmetric 5 x 5 power. Skin: No rashes, lesions or ulcers Psychiatry: Judgement and insight appear poor. Mood & affect appropriate.     Objective: Vitals:   07/29/20 0300 07/29/20 0501 07/29/20 0538 07/29/20 0600  BP: (!) 164/62 (!) 190/63 (!) 183/72 101/78  Pulse:  74    Resp:  20    Temp:  97.7 F (36.5 C)    TempSrc:  Oral    SpO2:  94%    Weight:      Height:        Intake/Output Summary (Last 24 hours) at 07/29/2020 0814 Last data filed at 07/29/2020 0302 Gross per 24 hour  Intake 240 ml  Output 850 ml  Net -610 ml   Filed Weights   07/07/20 1731  Weight: 104.9 kg     Data Reviewed:   CBC: Recent Labs  Lab 07/23/20 0050 07/24/20 0500 07/25/20 0800 07/29/20 0548  WBC 4.6 4.6 4.9 6.5  NEUTROABS 2.1 2.5 3.0 4.2  HGB 11.5* 11.0* 11.1* 11.6*  HCT 37.0 35.9* 34.9* 35.8*  MCV 98.4 97.3 95.4 95.0  PLT 145* 151 164 165   Basic Metabolic Panel: Recent Labs  Lab 07/25/20 0800 07/26/20 0438 07/27/20 0302 07/28/20 0302 07/29/20 0548  NA 139 139 140 142 141  K 4.9 3.1* 3.5 3.7 3.5  CL 89* 86* 87* 91* 90*  CO2 40* 48* 46* 42* 44*  GLUCOSE 126* 96 249* 219* 159*  BUN 11 8 13 20 20   CREATININE <0.30* 0.33* 0.62 0.52 0.36*  CALCIUM 8.3* 8.5* 8.5* 8.4* 8.6*  MG 1.9 1.5* 1.8 1.5* 1.6*   GFR: Estimated  Creatinine Clearance: 82.1 mL/min (A) (by C-G formula based on SCr of 0.36 mg/dL (L)). Liver Function Tests: Recent Labs  Lab 07/23/20 0050  AST 13*  ALT 7  ALKPHOS 46  BILITOT 0.2*  PROT 4.9*  ALBUMIN 2.0*   No results for input(s): LIPASE, AMYLASE in the last 168 hours. No results for input(s): AMMONIA in the last 168 hours. Coagulation Profile: No results for input(s): INR, PROTIME in the last 168  hours. Cardiac Enzymes: No results for input(s): CKTOTAL, CKMB, CKMBINDEX, TROPONINI in the last 168 hours. BNP (last 3 results) No results for input(s): PROBNP in the last 8760 hours. HbA1C: No results for input(s): HGBA1C in the last 72 hours. CBG: Recent Labs  Lab 07/28/20 0728 07/28/20 1126 07/28/20 1602 07/28/20 2023 07/29/20 0605  GLUCAP 120* 194* 240* 123* 142*   Lipid Profile: No results for input(s): CHOL, HDL, LDLCALC, TRIG, CHOLHDL, LDLDIRECT in the last 72 hours. Thyroid Function Tests: No results for input(s): TSH, T4TOTAL, FREET4, T3FREE, THYROIDAB in the last 72 hours. Anemia Panel: No results for input(s): VITAMINB12, FOLATE, FERRITIN, TIBC, IRON, RETICCTPCT in the last 72 hours. Sepsis Labs: No results for input(s): PROCALCITON, LATICACIDVEN in the last 168 hours.  No results found for this or any previous visit (from the past 240 hour(s)).       Radiology Studies: No results found.      Scheduled Meds: . apixaban  5 mg Oral BID  . atorvastatin  20 mg Oral QHS  . bethanechol  25 mg Oral TID  . Chlorhexidine Gluconate Cloth  6 each Topical Daily  . diltiazem  240 mg Oral Daily  . divalproex  500 mg Oral Q12H  . fluticasone furoate-vilanterol  1 puff Inhalation Daily  . insulin aspart  0-20 Units Subcutaneous TID WC  . insulin aspart  0-5 Units Subcutaneous QHS  . insulin detemir  30 Units Subcutaneous Daily  . loxapine  20 mg Oral QHS  . melatonin  5 mg Oral QHS  . pantoprazole  40 mg Oral Daily  . polyvinyl alcohol  1 drop Both Eyes Q breakfast   Continuous Infusions:   LOS: 29 days   Time spent= 35 mins    Ankit Joline Maxcy, MD [facility]  If 7PM-7AM, please contact night-coverage  07/29/2020, 8:14 AM

## 2020-07-30 DIAGNOSIS — F0391 Unspecified dementia with behavioral disturbance: Secondary | ICD-10-CM | POA: Diagnosis not present

## 2020-07-30 DIAGNOSIS — J441 Chronic obstructive pulmonary disease with (acute) exacerbation: Secondary | ICD-10-CM | POA: Diagnosis not present

## 2020-07-30 DIAGNOSIS — T383X5A Adverse effect of insulin and oral hypoglycemic [antidiabetic] drugs, initial encounter: Secondary | ICD-10-CM

## 2020-07-30 DIAGNOSIS — E160 Drug-induced hypoglycemia without coma: Secondary | ICD-10-CM

## 2020-07-30 DIAGNOSIS — E119 Type 2 diabetes mellitus without complications: Secondary | ICD-10-CM | POA: Diagnosis not present

## 2020-07-30 LAB — CBC
HCT: 36.9 % (ref 36.0–46.0)
Hemoglobin: 11.4 g/dL — ABNORMAL LOW (ref 12.0–15.0)
MCH: 29.9 pg (ref 26.0–34.0)
MCHC: 30.9 g/dL (ref 30.0–36.0)
MCV: 96.9 fL (ref 80.0–100.0)
Platelets: 177 10*3/uL (ref 150–400)
RBC: 3.81 MIL/uL — ABNORMAL LOW (ref 3.87–5.11)
RDW: 14.6 % (ref 11.5–15.5)
WBC: 7.1 10*3/uL (ref 4.0–10.5)
nRBC: 0 % (ref 0.0–0.2)

## 2020-07-30 LAB — BASIC METABOLIC PANEL
Anion gap: 8 (ref 5–15)
BUN: 21 mg/dL (ref 8–23)
CO2: 41 mmol/L — ABNORMAL HIGH (ref 22–32)
Calcium: 8.5 mg/dL — ABNORMAL LOW (ref 8.9–10.3)
Chloride: 92 mmol/L — ABNORMAL LOW (ref 98–111)
Creatinine, Ser: 0.31 mg/dL — ABNORMAL LOW (ref 0.44–1.00)
GFR, Estimated: >60 mL/min (ref >60)
Glucose, Bld: 35 mg/dL — CL (ref 70–99)
Potassium: 3.5 mmol/L (ref 3.5–5.1)
Sodium: 141 mmol/L (ref 135–145)

## 2020-07-30 LAB — PROCALCITONIN: Procalcitonin: <0.1 ng/mL

## 2020-07-30 LAB — BRAIN NATRIURETIC PEPTIDE: B Natriuretic Peptide: 42.8 pg/mL (ref 0.0–100.0)

## 2020-07-30 LAB — GLUCOSE, CAPILLARY
Glucose-Capillary: 72 mg/dL (ref 70–99)
Glucose-Capillary: 136 mg/dL — ABNORMAL HIGH (ref 70–99)
Glucose-Capillary: 233 mg/dL — ABNORMAL HIGH (ref 70–99)
Glucose-Capillary: 172 mg/dL — ABNORMAL HIGH (ref 70–99)
Glucose-Capillary: 116 mg/dL — ABNORMAL HIGH (ref 70–99)

## 2020-07-30 LAB — MAGNESIUM: Magnesium: 1.7 mg/dL (ref 1.7–2.4)

## 2020-07-30 MED ORDER — INSULIN ASPART 100 UNIT/ML ~~LOC~~ SOLN
0.0000 [IU] | Freq: Every day | SUBCUTANEOUS | Status: DC
  Administered 2020-07-31 – 2020-08-03 (×2): 2 [IU] via SUBCUTANEOUS

## 2020-07-30 MED ORDER — INSULIN DETEMIR 100 UNIT/ML ~~LOC~~ SOLN: 25.0000 [IU] | Freq: Every day | SUBCUTANEOUS | Status: DC

## 2020-07-30 MED ORDER — INSULIN DETEMIR 100 UNIT/ML ~~LOC~~ SOLN
15.0000 [IU] | Freq: Every day | SUBCUTANEOUS | Status: DC
  Administered 2020-07-30 – 2020-08-06 (×8): 15 [IU] via SUBCUTANEOUS
  Filled 2020-07-30 (×8): qty 0.15

## 2020-07-30 MED ORDER — INSULIN ASPART 100 UNIT/ML ~~LOC~~ SOLN
0.0000 [IU] | Freq: Three times a day (TID) | SUBCUTANEOUS | Status: DC
  Administered 2020-07-30: 2 [IU] via SUBCUTANEOUS
  Administered 2020-07-30: 1 [IU] via SUBCUTANEOUS
  Administered 2020-07-31 – 2020-08-01 (×3): 2 [IU] via SUBCUTANEOUS
  Administered 2020-08-01: 3 [IU] via SUBCUTANEOUS
  Administered 2020-08-02 – 2020-08-06 (×6): 1 [IU] via SUBCUTANEOUS
  Administered 2020-08-06: 2 [IU] via SUBCUTANEOUS
  Administered 2020-08-06: 1 [IU] via SUBCUTANEOUS

## 2020-07-30 NOTE — Progress Notes (Signed)
Inpatient Diabetes Program Recommendations  AACE/ADA: New Consensus Statement on Inpatient Glycemic Control (2015)  Target Ranges:  Prepandial:   less than 140 mg/dL      Peak postprandial:   less than 180 mg/dL (1-2 hours)      Critically ill patients:  140 - 180 mg/dL   Lab Results  Component Value Date   GLUCAP 136 (H) 07/30/2020   HGBA1C 7.2 (H) 06/30/2020    Review of Glycemic Control Results for Stephanie Rice, Stephanie Rice (MRN [id number]) as of 07/30/2020 09:44  Ref. Range 07/29/2020 08:23 07/29/2020 12:15 07/29/2020 16:08 07/29/2020 22:56 07/30/2020 04:47 07/30/2020 09:18  Glucose-Capillary Latest Ref Range: 70 - 99 mg/dL 863 (H) 817 (H) 711 (H) 308 (H) 72 136 (H)   Diabetes history: DM2 Outpatient Diabetes medications: Levemir 20 units QHS, Humalog 0-9 units TID. Current orders for Inpatient glycemic control: Levemir 15 units qd + Novolog correction 0-20 units tid + hs 0-5 units  Inpatient Diabetes Program Recommendations:   -Decrease Novolog correction to 0-15 units tid + hs 0-5 units -Add Novolog 3 units tid meal coverage if eats 50% Secure chat sent to Dr. Nelson Chimes.  Thank you, Billy Fischer. Jaun Galluzzo, RN, MSN, CDE  Diabetes Coordinator Inpatient Glycemic Control Team Team Pager [phone] (8am-5pm) 07/30/2020 11:01 AM

## 2020-07-30 NOTE — Progress Notes (Signed)
Lab called with critical glucose 35, pt had orange juice given by NT in meantime. CBG 72. Pt asymptomatic. Will continue to monitor pt and offer food and drinks.

## 2020-07-30 NOTE — Progress Notes (Signed)
PROGRESS NOTE    Stephanie Rice  YTK:[id number] DOB: 1955-07-15 DOA: 06/30/2020 PCP: [name], PA-C   Brief Narrative:  65 year old female with history of dementia, intellectual disability, COPD/chronic hypoxic respite failure on trilogy vent and 2 L oxygen as needed, recent DVT, obesity, diabetes mellitus type 2, bipolar disorder and hypertension was brought to the ED from SNF due to altered mental status and admitted for acute metabolic encephalopathy in the setting of hypercapnia from acute on chronic respiratory failure and COPD exacerbation and UTI.  She was treated with steroids, antibiotics and BiPAP with improvement in ABG but waxing and waning mental status and there was some concerns regarding behavioral issues.  Psychiatry was consulted and gave recommendations. Hospital course was complicated by acute thrombocytopenia and acute urinary retention.  Hematology was consulted for thrombocytopenia.  Psychiatry also adjusted psych medications which could potentially contribute to thrombocytopenia.  Her platelets have improved.  Oncology is following peripherally as well.  Patient has remained stable over the last several days waiting for insurance approval for SNF.  Daughter has appealed the discharge process, waiting for final decision from insurance.   Assessment & Plan:   Active Problems:   COPD with acute exacerbation (HCC)   COPD (chronic obstructive pulmonary disease) (HCC)   Pressure injury of skin   Acute metabolic encephalopathy/delirium -multifactorial including hypercapnia, UTI, CAP, underlying dementia, bipolar disorder and intellectual disability. Reportedly waxing or waning suggesting delirium. She was also on steroids for COPD exacerbation which could have contributed. CT head, RPR, B12 and TSH without acute finding -Still intermittently very drowsy -Reorientation and delirium precautions -Minimize sedating meds. -Psychiatry decreased Depakote to 500 mg twice  daily in the setting of acute thrombocytopenia. Patient often remains very sleepy. CT and MRI brain unremarkable; her Depakote level is within normal range.  Acute on chronic hypoxic and hypercapnic respiratory failure due to COPD exacerbation and CAP -resolved. Patient is on trilogy vent and oxygen 2 L as needed at baseline -Completed 5 days of doxycycline and 3 days of ceftriaxone -Completed course of systemic steroid -Apparently, SNF does not take patients with trilogy vent. So, she will need BiPAP on discharge.  -ProCal= neg, BNP= WNL  Chronic atrial fibrillation -TTE without significant finding. -Continue Eliquis twice daily.  Cardizem 240 mg daily  Dysphagia: -Dysphagia 2 diet per SLP. -Continue aspiration precautions  UTI -completed antibiotic course with ceftriaxone and Keflex  Hypertension -Blood pressure improving.  Continue Cardizem as above.  IDDM-2 with hypoglycemia and neuropathy:A1c 7.2%. -Cont ISS Accuchecks. Reduce Lantus to 15U daily.   History of bipolar disorder -Psychiatry decreased Depakote to 500 mg twice daily (home dose) in the setting of thrombocytopenia. -Patient is also on loxapine 20 mg daily -Outpatient follow-up with psychiatry  History of DVT -Continue Eliquis.  Acute urinary retention: resolved -Renal ultrasound unremarkable. Foley has been removed and per nursing, she has been voiding without any trouble.   Pressure injury of the right buttock stage II: Not present on admission -Continue local wound care  Thrombocytopenia -Resolved. Hematology signed off. May follow-up as outpatient with hematology if needed  Obesity -Outpatient follow-up  Generalized conditioning -Palliative care evaluation appreciated: Recommend outpatient palliative care follow-up.  Overall prognosis is poor   DVT prophylaxis:  apixaban (ELIQUIS) tablet 5 mg  Code Status: DNR Family Communication:    Status is: Inpatient  Remains  inpatient appropriate because:Unsafe d/c plan   Dispo: The patient is from: Home  Anticipated d/c is to: SNF              Patient currently is medically stable to d/c.   Difficult to place patient Yes.  TOC to working on placement/safe disposition.    Body mass index is 38.48 kg/m.  Pressure Injury 07/21/20 Buttocks Right Stage 2 -  Partial thickness loss of dermis presenting as a shallow open injury with a red, pink wound bed without slough. 1*1 stage 2 pressure injury (Active)  07/21/20 1637  Location: Buttocks  Location Orientation: Right  Staging: Stage 2 -  Partial thickness loss of dermis presenting as a shallow open injury with a red, pink wound bed without slough.  Wound Description (Comments): 1*1 stage 2 pressure injury  Present on Admission: No     Subjective: Hypoglycemic events overnight but asymptomatic.   Review of Systems Otherwise negative except as per HPI, including: General = no fevers, chills, dizziness,  fatigue HEENT/EYES = negative for loss of vision, double vision, blurred vision,  sore throa Cardiovascular= negative for chest pain, palpitation Respiratory/lungs= negative for shortness of breath, cough, wheezing; hemoptysis,  Gastrointestinal= negative for nausea, vomiting, abdominal pain Genitourinary= negative for Dysuria MSK = Negative for arthralgia, myalgias Neurology= Negative for headache, numbness, tingling  Psychiatry= Negative for suicidal and homocidal ideation Skin= Negative for Rash  Examination: Constitutional: Not in acute distress Respiratory: Clear to auscultation bilaterally Cardiovascular: Normal sinus rhythm, no rubs Abdomen: Nontender nondistended good bowel sounds Musculoskeletal: No edema noted Skin: No rashes seen Neurologic: CN 2-12 grossly intact.  And nonfocal Psychiatric: Poorl judgment and insight. Alert and oriented x 1 name. Normal mood.     Objective: Vitals:   07/29/20 0538 07/29/20 0600 07/29/20  1243 07/29/20 2230  BP: (!) 183/72 101/78 (!) 167/66 (!) 145/70  Pulse:   74 87  Resp:   20 18  Temp:   97.6 F (36.4 C) 98 F (36.7 C)  TempSrc:   Oral Oral  SpO2:   96% 100%  Weight:      Height:       No intake or output data in the 24 hours ending 07/30/20 0829 Filed Weights   07/07/20 1731  Weight: 104.9 kg     Data Reviewed:   CBC: Recent Labs  Lab 07/24/20 0500 07/25/20 0800 07/29/20 0548 07/30/20 0256  WBC 4.6 4.9 6.5 7.1  NEUTROABS 2.5 3.0 4.2  --   HGB 11.0* 11.1* 11.6* 11.4*  HCT 35.9* 34.9* 35.8* 36.9  MCV 97.3 95.4 95.0 96.9  PLT 151 164 165 177   Basic Metabolic Panel: Recent Labs  Lab 07/26/20 0438 07/27/20 0302 07/28/20 0302 07/29/20 0548 07/30/20 0256  NA 139 140 142 141 141  K 3.1* 3.5 3.7 3.5 3.5  CL 86* 87* 91* 90* 92*  CO2 48* 46* 42* 44* 41*  GLUCOSE 96 249* 219* 159* 35*  BUN 8 13 20 20 21   CREATININE 0.33* 0.62 0.52 0.36* 0.31*  CALCIUM 8.5* 8.5* 8.4* 8.6* 8.5*  MG 1.5* 1.8 1.5* 1.6* 1.7   GFR: Estimated Creatinine Clearance: 82.1 mL/min (A) (by C-G formula based on SCr of 0.31 mg/dL (L)). Liver Function Tests: No results for input(s): AST, ALT, ALKPHOS, BILITOT, PROT, ALBUMIN in the last 168 hours. No results for input(s): LIPASE, AMYLASE in the last 168 hours. No results for input(s): AMMONIA in the last 168 hours. Coagulation Profile: No results for input(s): INR, PROTIME in the last 168 hours. Cardiac Enzymes: No results for input(s): CKTOTAL, CKMB, CKMBINDEX, TROPONINI  in the last 168 hours. BNP (last 3 results) No results for input(s): PROBNP in the last 8760 hours. HbA1C: No results for input(s): HGBA1C in the last 72 hours. CBG: Recent Labs  Lab 07/29/20 0823 07/29/20 1215 07/29/20 1608 07/29/20 2256 07/30/20 0447  GLUCAP 126* 212* 200* 308* 72   Lipid Profile: No results for input(s): CHOL, HDL, LDLCALC, TRIG, CHOLHDL, LDLDIRECT in the last 72 hours. Thyroid Function Tests: No results for input(s): TSH,  T4TOTAL, FREET4, T3FREE, THYROIDAB in the last 72 hours. Anemia Panel: No results for input(s): VITAMINB12, FOLATE, FERRITIN, TIBC, IRON, RETICCTPCT in the last 72 hours. Sepsis Labs: Recent Labs  Lab 07/30/20 0256  PROCALCITON <0.10    No results found for this or any previous visit (from the past 240 hour(s)).       Radiology Studies: No results found.      Scheduled Meds: . apixaban  5 mg Oral BID  . atorvastatin  20 mg Oral QHS  . bethanechol  25 mg Oral TID  . Chlorhexidine Gluconate Cloth  6 each Topical Daily  . diltiazem  240 mg Oral Daily  . divalproex  500 mg Oral Q12H  . fluticasone furoate-vilanterol  1 puff Inhalation Daily  . insulin aspart  0-20 Units Subcutaneous TID WC  . insulin aspart  0-5 Units Subcutaneous QHS  . insulin detemir  25 Units Subcutaneous Daily  . loxapine  20 mg Oral QHS  . melatonin  5 mg Oral QHS  . pantoprazole  40 mg Oral Daily  . polyvinyl alcohol  1 drop Both Eyes Q breakfast   Continuous Infusions:   LOS: 30 days   Time spent= 35 mins    Brock Mokry Joline Maxcy, MD [facility]  If 7PM-7AM, please contact night-coverage  07/30/2020, 8:29 AM

## 2020-07-30 NOTE — Progress Notes (Signed)
Physical Therapy Treatment Patient Details Name: Stephanie Rice MRN: [id number] DOB: 1955-06-13 Today's Date: 07/30/2020    History of Present Illness Stephanie Rice is a 65 y.o. female with medical history significant of chronic hypoxic and hypercapnic respiratory failure secondary COPD, on Trelegy home vent PRN +2 L PRN, HTN, recent diagnosed DVT, morbid obesity, IDDM, Dementia, bipolar disorder, presented with altered mentation. episodes requiring BiPAP; episodes of afib    PT Comments    Patient with improved tolerance for activity this date--tolerating sitting EOB working on balance/postural stability in addition to ADL activities with OT. See below for levels of assist.    Follow Up Recommendations  SNF;Supervision/Assistance - 24 hour     Equipment Recommendations  Other (comment) (hoyer with hoyer pad (noted daughter does not want w/c and insurance won't pay for another hospital bed))    Recommendations for Other Services       Precautions / Restrictions Precautions Precautions: Fall Precaution Comments: monitor O2, delirium Restrictions Weight Bearing Restrictions: No    Mobility  Bed Mobility Overal bed mobility: Needs Assistance Bed Mobility: Rolling;Sidelying to Sit;Sit to Sidelying Rolling: Max assist Sidelying to sit: Max assist;+2 for safety/equipment     Sit to sidelying: Max assist General bed mobility comments: Max A overall 1-2 assist for task, some impulsivity with leaning back on bed during session (pt endorses taking a break this way). Max A for rolling side to side for bedpan use    Transfers                 General transfer comment: unable to progress beyond EOB  Ambulation/Gait                 Stairs             Wheelchair Mobility    Modified Rankin (Stroke Patients Only)       Balance Overall balance assessment: Needs assistance Sitting-balance support: Feet supported;Single extremity supported Sitting  balance-Leahy Scale: Poor Sitting balance - Comments: reliant on at least Min A for sitting EOB or use of L UE                                    Cognition Arousal/Alertness: Awake/alert Behavior During Therapy: WFL for tasks assessed/performed Overall Cognitive Status: History of cognitive impairments - at baseline Area of Impairment: Attention;Following commands;Problem solving;Memory;Safety/judgement;Awareness;Orientation                 Orientation Level: Disoriented to;Place;Situation;Time Current Attention Level: Sustained Memory: Decreased short-term memory Following Commands: Follows one step commands with increased time;Follows one step commands inconsistently Safety/Judgement: Decreased awareness of safety;Decreased awareness of deficits Awareness: Intellectual Problem Solving: Slow processing;Decreased initiation;Difficulty sequencing;Requires verbal cues;Requires tactile cues General Comments: follows one step commands consistently, laughing and pleasant today      Exercises      General Comments General comments (skin integrity, edema, etc.): Sat EOB ~10 minutes while working on postural stability as she worked on Marine scientist with OT. With LUE support, she required only minguard assist, however when LUE engaged in task, she required min assist due to left lean      Pertinent Vitals/Pain Pain Assessment: Faces Faces Pain Scale: No hurt Pain Intervention(s): Monitored during session    Home Living                      Prior Function  PT Goals (current goals can now be found in the care plan section) Acute Rehab PT Goals Patient Stated Goal: eat lunch PT Goal Formulation: Patient unable to participate in goal setting Time For Goal Achievement: 08/13/20 Progress towards PT goals: Progressing toward goals    Frequency    Min 2X/week      PT Plan Current plan remains appropriate    Co-evaluation PT/OT/SLP  Co-Evaluation/Treatment: Yes Reason for Co-Treatment: Complexity of the patient's impairments (multi-system involvement);Necessary to address cognition/behavior during functional activity;For patient/therapist safety;To address functional/ADL transfers PT goals addressed during session: Mobility/safety with mobility;Balance OT goals addressed during session: ADL's and self-care      AM-PAC PT "6 Clicks" Mobility   Outcome Measure  Help needed turning from your back to your side while in a flat bed without using bedrails?: A Lot Help needed moving from lying on your back to sitting on the side of a flat bed without using bedrails?: A Lot Help needed moving to and from a bed to a chair (including a wheelchair)?: Total Help needed standing up from a chair using your arms (e.g., wheelchair or bedside chair)?: Total Help needed to walk in hospital room?: Total Help needed climbing 3-5 steps with a railing? : Total 6 Click Score: 8    End of Session Equipment Utilized During Treatment: Oxygen Activity Tolerance: Patient limited by fatigue Patient left: in bed;with call bell/phone within reach;with bed alarm set   PT Visit Diagnosis: Other abnormalities of gait and mobility (R26.89)     Time: 6962-9528 PT Time Calculation (min) (ACUTE ONLY): 33 min  Charges:  $Therapeutic Activity: 8-22 mins                      Stephanie Rice, PT Pager [phone]    Zena Amos 07/30/2020, 4:22 PM

## 2020-07-30 NOTE — Progress Notes (Signed)
Occupational Therapy Treatment Patient Details Name: Stephanie Rice MRN: [id number] DOB: 1956/05/10 Today's Date: 07/30/2020    History of present illness Vida Nicol is a 65 y.o. female with medical history significant of chronic hypoxic and hypercapnic respiratory failure secondary COPD, on Trelegy home vent PRN +2 L PRN, HTN, recent diagnosed DVT, morbid obesity, IDDM, Dementia, bipolar disorder, presented with altered mentation. episodes requiring BiPAP; episodes of afib   OT comments  Pt pleasant and participatory today. Pt seen in conjunction with PT to maximize therapeutic benefit and to progress activity. Pt overall Max A 1-2 for all bed mobility. Pt able to demo self feeding at Min A overall with Min A to maintain balance when unsupported with UE. Pt sat EOB 10 min for ADL tasks before fatiguing and returning back to bed. Pt also noted to be straining sitting EOB and endorsed attempt to have BM when asked. Pt assisted on bed pan with Total A for clean up and linen change. DC recs remain appropriate.    Follow Up Recommendations  SNF;Supervision/Assistance - 24 hour    Equipment Recommendations  Wheelchair (measurements OT);Wheelchair cushion (measurements OT);Hospital bed (hoyer lift, hoyer pad)    Recommendations for Other Services      Precautions / Restrictions Precautions Precautions: Fall Precaution Comments: monitor O2, delirium Restrictions Weight Bearing Restrictions: No       Mobility Bed Mobility Overal bed mobility: Needs Assistance Bed Mobility: Rolling;Sidelying to Sit;Sit to Sidelying Rolling: Max assist Sidelying to sit: Max assist;+2 for safety/equipment     Sit to sidelying: Max assist General bed mobility comments: Max A overall 1-2 assist for task, some impulsivity with leaning back on bed during session (pt endorses taking a break this way). Max A for rolling side to side for bedpan use    Transfers                 General transfer  comment: unable to progress beyond EOB    Balance Overall balance assessment: Needs assistance Sitting-balance support: Feet supported;Single extremity supported Sitting balance-Leahy Scale: Poor Sitting balance - Comments: reliant on at least Min A for sitting EOB or use of L UE                                   ADL either performed or assessed with clinical judgement   ADL Overall ADL's : Needs assistance/impaired Eating/Feeding: Minimal assistance;Sitting Eating/Feeding Details (indicate cue type and reason): Min A for self feeding sitting EOB, alternating using B hands for task. assistance to scoop food at times but able to demo ability to do this without assist. use of L UE to maintain balance EOB                         Toileting- Clothing Manipulation and Hygiene: Total assistance;Bed level Toileting - Clothing Manipulation Details (indicate cue type and reason): Total A for clean up, bed pan use in bed. While sitting EOB, pt noted to be straining - when asked, pt reports trying to have BM (did not indicate need for this or request bed pan). prompted to use bed pan and pt agreeable       General ADL Comments: Improved following of commands, pleasant today     Vision   Vision Assessment?: No apparent visual deficits   Perception     Praxis      Cognition Arousal/Alertness: Awake/alert Behavior  During Therapy: WFL for tasks assessed/performed Overall Cognitive Status: History of cognitive impairments - at baseline Area of Impairment: Attention;Following commands;Problem solving;Memory;Safety/judgement;Awareness;Orientation                 Orientation Level: Disoriented to;Place;Situation;Time Current Attention Level: Sustained Memory: Decreased short-term memory Following Commands: Follows one step commands with increased time;Follows one step commands inconsistently Safety/Judgement: Decreased awareness of safety;Decreased awareness of  deficits Awareness: Intellectual Problem Solving: Slow processing;Decreased initiation;Difficulty sequencing;Requires verbal cues;Requires tactile cues General Comments: follows one step commands consistently, laughing and pleasant today        Exercises     Shoulder Instructions       General Comments VSS on 4 L O2    Pertinent Vitals/ Pain       Pain Assessment: Faces Faces Pain Scale: No hurt Pain Intervention(s): Monitored during session  Home Living                                          Prior Functioning/Environment              Frequency  Min 1X/week        Progress Toward Goals  OT Goals(current goals can now be found in the care plan section)  Progress towards OT goals: OT to reassess next treatment  Acute Rehab OT Goals Patient Stated Goal: eat lunch OT Goal Formulation: Patient unable to participate in goal setting Time For Goal Achievement: 08/06/20 Potential to Achieve Goals: Fair ADL Goals Pt Will Perform Eating: with set-up;with supervision;bed level;sitting Pt Will Perform Grooming: with min assist;sitting;bed level Pt Will Perform Upper Body Bathing: with mod assist;bed level Pt Will Perform Upper Body Dressing: with mod assist;bed level Additional ADL Goal #1: Pt to demonstrate ability to follow one step commands >50% of the time Additional ADL Goal #2: Pt to be A&Ox2 with use of external cues Additional ADL Goal #3: Pt to demo bed mobility at Min A in prep for ADL transfers  Plan Discharge plan remains appropriate;Frequency needs to be updated    Co-evaluation    PT/OT/SLP Co-Evaluation/Treatment: Yes Reason for Co-Treatment: Necessary to address cognition/behavior during functional activity;For patient/therapist safety;To address functional/ADL transfers   OT goals addressed during session: ADL's and self-care      AM-PAC OT "6 Clicks" Daily Activity     Outcome Measure   Help from another person eating  meals?: A Little Help from another person taking care of personal grooming?: A Little Help from another person toileting, which includes using toliet, bedpan, or urinal?: Total Help from another person bathing (including washing, rinsing, drying)?: Total Help from another person to put on and taking off regular upper body clothing?: Total Help from another person to put on and taking off regular lower body clothing?: Total 6 Click Score: 10    End of Session Equipment Utilized During Treatment: Oxygen  OT Visit Diagnosis: Other abnormalities of gait and mobility (R26.89);Muscle weakness (generalized) (M62.81);Other symptoms and signs involving cognitive function   Activity Tolerance Patient tolerated treatment well   Patient Left in bed;with call bell/phone within reach;with bed alarm set   Nurse Communication Mobility status;Other (comment) (lunch)        Time: 3976-7341 OT Time Calculation (min): 33 min  Charges: OT General Charges $OT Visit: 1 Visit OT Treatments $Self Care/Home Management : 8-22 mins  Bradd Canary, OTR/L Acute Rehab Services Office: [phone]  Lorre Munroe 07/30/2020, 2:00 PM

## 2020-07-31 DIAGNOSIS — G309 Alzheimer's disease, unspecified: Secondary | ICD-10-CM | POA: Diagnosis not present

## 2020-07-31 DIAGNOSIS — J441 Chronic obstructive pulmonary disease with (acute) exacerbation: Secondary | ICD-10-CM | POA: Diagnosis not present

## 2020-07-31 DIAGNOSIS — F0280 Dementia in other diseases classified elsewhere without behavioral disturbance: Secondary | ICD-10-CM | POA: Diagnosis not present

## 2020-07-31 DIAGNOSIS — F319 Bipolar disorder, unspecified: Secondary | ICD-10-CM | POA: Diagnosis not present

## 2020-07-31 LAB — BASIC METABOLIC PANEL
Anion gap: 6 (ref 5–15)
BUN: 22 mg/dL (ref 8–23)
CO2: 44 mmol/L — ABNORMAL HIGH (ref 22–32)
Calcium: 8.3 mg/dL — ABNORMAL LOW (ref 8.9–10.3)
Chloride: 88 mmol/L — ABNORMAL LOW (ref 98–111)
Creatinine, Ser: 0.46 mg/dL (ref 0.44–1.00)
GFR, Estimated: >60 mL/min (ref >60)
Glucose, Bld: 170 mg/dL — ABNORMAL HIGH (ref 70–99)
Potassium: 3.8 mmol/L (ref 3.5–5.1)
Sodium: 138 mmol/L (ref 135–145)

## 2020-07-31 LAB — CBC
HCT: 35.6 % — ABNORMAL LOW (ref 36.0–46.0)
Hemoglobin: 11.4 g/dL — ABNORMAL LOW (ref 12.0–15.0)
MCH: 30.6 pg (ref 26.0–34.0)
MCHC: 32 g/dL (ref 30.0–36.0)
MCV: 95.7 fL (ref 80.0–100.0)
Platelets: 176 10*3/uL (ref 150–400)
RBC: 3.72 MIL/uL — ABNORMAL LOW (ref 3.87–5.11)
RDW: 14.6 % (ref 11.5–15.5)
WBC: 5.8 10*3/uL (ref 4.0–10.5)
nRBC: 0 % (ref 0.0–0.2)

## 2020-07-31 LAB — GLUCOSE, CAPILLARY
Glucose-Capillary: 113 mg/dL — ABNORMAL HIGH (ref 70–99)
Glucose-Capillary: 240 mg/dL — ABNORMAL HIGH (ref 70–99)
Glucose-Capillary: 233 mg/dL — ABNORMAL HIGH (ref 70–99)
Glucose-Capillary: 211 mg/dL — ABNORMAL HIGH (ref 70–99)

## 2020-07-31 LAB — MAGNESIUM: Magnesium: 1.8 mg/dL (ref 1.7–2.4)

## 2020-07-31 NOTE — Progress Notes (Signed)
PROGRESS NOTE    Stephanie Rice  IRJ:[id number] DOB: 08/24/1955 DOA: 06/30/2020 PCP: [name], PA-C   Brief Narrative:  65 year old female with history of dementia, intellectual disability, COPD/chronic hypoxic respite failure on trilogy vent and 2 L oxygen as needed, recent DVT, obesity, diabetes mellitus type 2, bipolar disorder and hypertension was brought to the ED from SNF due to altered mental status and admitted for acute metabolic encephalopathy in the setting of hypercapnia from acute on chronic respiratory failure and COPD exacerbation and UTI.  She was treated with steroids, antibiotics and BiPAP with improvement in ABG but waxing and waning mental status and there was some concerns regarding behavioral issues.  Psychiatry was consulted and gave recommendations. Hospital course was complicated by acute thrombocytopenia and acute urinary retention.  Hematology was consulted for thrombocytopenia.  Psychiatry also adjusted psych medications which could potentially contribute to thrombocytopenia.  Her platelets have improved.  Oncology is following peripherally as well.  Patient has remained stable over the last several days waiting for insurance approval for SNF.  Daughter has appealed the discharge process, waiting for final decision from insurance.  Assessment & Plan:   Active Problems:   COPD with acute exacerbation (HCC)   COPD (chronic obstructive pulmonary disease) (HCC)   Pressure injury of skin   Acute metabolic encephalopathy/delirium -multifactorial including hypercapnia, UTI, CAP, underlying dementia, bipolar disorder and intellectual disability. Reportedly waxing or waning suggesting delirium. She was also on steroids for COPD exacerbation which could have contributed. CT head, RPR, B12 and TSH without acute finding -Still intermittently very drowsy -Reorientation and delirium precautions -Minimize sedating meds. -Psychiatry -Depakote to 500 mg twice daily in  the setting of acute thrombocytopenia. Patient often remains very sleepy. CT and MRI brain unremarkable; her Depakote level is within normal range.  Acute on chronic hypoxic and hypercapnic respiratory failure due to COPD exacerbation and CAP -resolved. Patient is on trilogy vent and oxygen 2 L as needed at baseline -Completed 5 days of doxycycline and 3 days of ceftriaxone -Completed course of systemic steroid -Apparently, SNF does not take patients with trilogy vent. So, she will need BiPAP on discharge.  -ProCal= neg, BNP= WNL  Chronic atrial fibrillation -TTE without significant finding. -Continue Eliquis twice daily.  Cardizem 240 mg daily  Dysphagia: -Dys II diet per SLP -Continue aspiration precautions  UTI -completed antibiotic course with ceftriaxone and Keflex  Hypertension -Blood pressure improving.  Continue Cardizem as above.  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 -Cont ISS Accuchecks. Reduce Lantus to 15U daily.   History of bipolar disorder -Psychiatry decreased Depakote to 500 mg twice daily (home dose) in the setting of thrombocytopenia. -Patient is also on loxapine 20 mg daily -Outpatient follow-up with psychiatry  History of DVT -Continue Eliquis.  Acute urinary retention: resolved -Renal ultrasound unremarkable. Foley has been removed and per nursing, she has been voiding without any trouble.   Pressure injury of the right buttock stage II: Not present on admission -Continue local wound care  Thrombocytopenia -Resolved. Hematology signed off. May follow-up as outpatient with hematology if needed  Obesity -Outpatient follow-up  Generalized conditioning -Palliative care evaluation appreciated: Recommend outpatient palliative care follow-up.  Overall prognosis is poor   DVT prophylaxis:  apixaban (ELIQUIS) tablet 5 mg  Code Status: DNR Family Communication:  Spoke with [employer]   Status is: Inpatient  Remains inpatient appropriate  because:Unsafe d/c plan   Dispo: The patient is from: Home              Anticipated  d/c is to: SNF              Patient currently is medically stable to d/c.   Difficult to place patient Yes.  TOC to working on placement/safe disposition.    Body mass index is 38.48 kg/m.  Pressure Injury 07/21/20 Buttocks Right Stage 2 -  Partial thickness loss of dermis presenting as a shallow open injury with a red, pink wound bed without slough. 1*1 stage 2 pressure injury (Active)  07/21/20 1637  Location: Buttocks  Location Orientation: Right  Staging: Stage 2 -  Partial thickness loss of dermis presenting as a shallow open injury with a red, pink wound bed without slough.  Wound Description (Comments): 1*1 stage 2 pressure injury  Present on Admission: No     Subjective: Feels ok, no complaints.    Examination: Constitutional: Not in acute distress; 4L [place] Respiratory: b/l bibasilar crackles.  Cardiovascular: Normal sinus rhythm, no rubs Abdomen: Nontender nondistended good bowel sounds Musculoskeletal: No edema noted Skin: No rashes seen Neurologic: CN 2-12 grossly intact.  And nonfocal Psychiatric: Poor judgment and insight. Alert and oriented x 2    Objective: Vitals:   07/29/20 0600 07/29/20 1243 07/29/20 2230 07/30/20 2313  BP: 101/78 (!) 167/66 (!) 145/70 (!) 141/94  Pulse:  74 87 95  Resp:  20 18 16   Temp:  97.6 F (36.4 C) 98 F (36.7 C) 98.5 F (36.9 C)  TempSrc:  Oral Oral Oral  SpO2:  96% 100% 93%  Weight:      Height:       No intake or output data in the 24 hours ending 07/31/20 0856 Filed Weights   07/07/20 1731  Weight: 104.9 kg     Data Reviewed:   CBC: Recent Labs  Lab 07/25/20 0800 07/29/20 0548 07/30/20 0256 07/31/20 0310  WBC 4.9 6.5 7.1 5.8  NEUTROABS 3.0 4.2  --   --   HGB 11.1* 11.6* 11.4* 11.4*  HCT 34.9* 35.8* 36.9 35.6*  MCV 95.4 95.0 96.9 95.7  PLT 164 165 177 176   Basic Metabolic Panel: Recent Labs  Lab 07/27/20 0302  07/28/20 0302 07/29/20 0548 07/30/20 0256 07/31/20 0310  NA 140 142 141 141 138  K 3.5 3.7 3.5 3.5 3.8  CL 87* 91* 90* 92* 88*  CO2 46* 42* 44* 41* 44*  GLUCOSE 249* 219* 159* 35* 170*  BUN 13 20 20 21 22   CREATININE 0.62 0.52 0.36* 0.31* 0.46  CALCIUM 8.5* 8.4* 8.6* 8.5* 8.3*  MG 1.8 1.5* 1.6* 1.7 1.8   GFR: Estimated Creatinine Clearance: 82.1 mL/min (by C-G formula based on SCr of 0.46 mg/dL). Liver Function Tests: No results for input(s): AST, ALT, ALKPHOS, BILITOT, PROT, ALBUMIN in the last 168 hours. No results for input(s): LIPASE, AMYLASE in the last 168 hours. No results for input(s): AMMONIA in the last 168 hours. Coagulation Profile: No results for input(s): INR, PROTIME in the last 168 hours. Cardiac Enzymes: No results for input(s): CKTOTAL, CKMB, CKMBINDEX, TROPONINI in the last 168 hours. BNP (last 3 results) No results for input(s): PROBNP in the last 8760 hours. HbA1C: No results for input(s): HGBA1C in the last 72 hours. CBG: Recent Labs  Lab 07/30/20 0918 07/30/20 1215 07/30/20 1558 07/30/20 2104 07/31/20 0742  GLUCAP 136* 233* 172* 116* 113*   Lipid Profile: No results for input(s): CHOL, HDL, LDLCALC, TRIG, CHOLHDL, LDLDIRECT in the last 72 hours. Thyroid Function Tests: No results for input(s): TSH, T4TOTAL, FREET4, T3FREE, THYROIDAB in  the last 72 hours. Anemia Panel: No results for input(s): VITAMINB12, FOLATE, FERRITIN, TIBC, IRON, RETICCTPCT in the last 72 hours. Sepsis Labs: Recent Labs  Lab 07/30/20 0256  PROCALCITON <0.10    No results found for this or any previous visit (from the past 240 hour(s)).       Radiology Studies: No results found.      Scheduled Meds: . apixaban  5 mg Oral BID  . atorvastatin  20 mg Oral QHS  . bethanechol  25 mg Oral TID  . Chlorhexidine Gluconate Cloth  6 each Topical Daily  . diltiazem  240 mg Oral Daily  . divalproex  500 mg Oral Q12H  . fluticasone furoate-vilanterol  1 puff  Inhalation Daily  . insulin aspart  0-5 Units Subcutaneous QHS  . insulin aspart  0-6 Units Subcutaneous TID WC  . insulin detemir  15 Units Subcutaneous Daily  . loxapine  20 mg Oral QHS  . melatonin  5 mg Oral QHS  . pantoprazole  40 mg Oral Daily  . polyvinyl alcohol  1 drop Both Eyes Q breakfast   Continuous Infusions:   LOS: 31 days   Time spent= 35 mins    Macklen Wilhoite Joline Maxcy, MD [facility]  If 7PM-7AM, please contact night-coverage  07/31/2020, 8:56 AM

## 2020-07-31 NOTE — TOC Progression Note (Signed)
Transition of Care [facility]) - Progression Note    Patient Details  Name: Gretchen Weinfeld MRN: [id number] Date of Birth: Apr 03, 1956  Transition of Care [facility]) CM/SW Contact  [name], LCSW Phone Number: 07/31/2020, 11:55 AM  Clinical Narrative:   Phone call from pt daughter Joyce Gross.  She received notice from [facility] that her paperwork that she submitted was invalid due to missing date and missing phone number.  She has resubmitted.  Joyce Gross said that if SNF is not approved by Monday she would like to change plan to bring pt back home.  She is requesting that occur on Wednesday.    Expected Discharge Plan: Skilled Nursing Facility Barriers to Discharge: Continued Medical Work up  Expected Discharge Plan and Services Expected Discharge Plan: Skilled Nursing Facility     Post Acute Care Choice:  (daughter requesting LTAC) Living arrangements for the past 2 months: Skilled Nursing Facility (was hospitalized at [facility] for 30+ days prior to SNF)                           HH Arranged: PT,OT,Nurse's Aide HH Agency: [facility] Date [facility] Agency Contacted: 07/14/20 Time HH Agency Contacted: 1300 Representative spoke with at [facility] Agency: Kandee Keen   Social Determinants of Health (SDOH) Interventions    Readmission Risk Interventions No flowsheet data found.

## 2020-08-01 DIAGNOSIS — J441 Chronic obstructive pulmonary disease with (acute) exacerbation: Secondary | ICD-10-CM | POA: Diagnosis not present

## 2020-08-01 LAB — BASIC METABOLIC PANEL
Anion gap: 7 (ref 5–15)
BUN: 18 mg/dL (ref 8–23)
CO2: 41 mmol/L — ABNORMAL HIGH (ref 22–32)
Calcium: 8.6 mg/dL — ABNORMAL LOW (ref 8.9–10.3)
Chloride: 89 mmol/L — ABNORMAL LOW (ref 98–111)
Creatinine, Ser: 0.36 mg/dL — ABNORMAL LOW (ref 0.44–1.00)
GFR, Estimated: >60 mL/min (ref >60)
Glucose, Bld: 230 mg/dL — ABNORMAL HIGH (ref 70–99)
Potassium: 3.4 mmol/L — ABNORMAL LOW (ref 3.5–5.1)
Sodium: 137 mmol/L (ref 135–145)

## 2020-08-01 LAB — CBC
HCT: 34.7 % — ABNORMAL LOW (ref 36.0–46.0)
Hemoglobin: 11.4 g/dL — ABNORMAL LOW (ref 12.0–15.0)
MCH: 31.1 pg (ref 26.0–34.0)
MCHC: 32.9 g/dL (ref 30.0–36.0)
MCV: 94.6 fL (ref 80.0–100.0)
Platelets: 163 10*3/uL (ref 150–400)
RBC: 3.67 MIL/uL — ABNORMAL LOW (ref 3.87–5.11)
RDW: 14.3 % (ref 11.5–15.5)
WBC: 6.7 10*3/uL (ref 4.0–10.5)
nRBC: 0 % (ref 0.0–0.2)

## 2020-08-01 LAB — MAGNESIUM: Magnesium: 1.6 mg/dL — ABNORMAL LOW (ref 1.7–2.4)

## 2020-08-01 LAB — GLUCOSE, CAPILLARY
Glucose-Capillary: 180 mg/dL — ABNORMAL HIGH (ref 70–99)
Glucose-Capillary: 169 mg/dL — ABNORMAL HIGH (ref 70–99)
Glucose-Capillary: 227 mg/dL — ABNORMAL HIGH (ref 70–99)
Glucose-Capillary: 266 mg/dL — ABNORMAL HIGH (ref 70–99)
Glucose-Capillary: 144 mg/dL — ABNORMAL HIGH (ref 70–99)
Glucose-Capillary: 168 mg/dL — ABNORMAL HIGH (ref 70–99)

## 2020-08-01 MED ORDER — POTASSIUM CHLORIDE 20 MEQ PO PACK
40.0000 meq | PACK | Freq: Once | ORAL | Status: AC
  Administered 2020-08-01: 40 meq via ORAL
  Filled 2020-08-01: qty 2

## 2020-08-01 NOTE — Progress Notes (Signed)
PROGRESS NOTE    Stephanie Rice  ZOX:[id number] DOB: 1955/07/05 DOA: 06/30/2020 PCP: [name], PA-C   Brief Narrative:  65 year old female with history of dementia, intellectual disability, COPD/chronic hypoxic respite failure on trilogy vent and 2 L oxygen as needed, recent DVT, obesity, diabetes mellitus type 2, bipolar disorder and hypertension was brought to the ED from SNF due to altered mental status and admitted for acute metabolic encephalopathy in the setting of hypercapnia from acute on chronic respiratory failure and COPD exacerbation and UTI.  She was treated with steroids, antibiotics and BiPAP with improvement in ABG but waxing and waning mental status and there was some concerns regarding behavioral issues.  Psychiatry was consulted and gave recommendations. Hospital course was complicated by acute thrombocytopenia and acute urinary retention.  Hematology was consulted for thrombocytopenia.  Psychiatry also adjusted psych medications which could potentially contribute to thrombocytopenia.  Her platelets have improved.  Oncology is following peripherally as well.  Patient has remained stable over the last several days waiting for insurance approval for SNF.  Daughter has appealed the discharge process, waiting for final decision from insurance.  Assessment & Plan:   Principal Problem:   COPD with acute exacerbation (HCC) Active Problems:   Diabetes mellitus type 2, controlled (HCC)   Dementia (HCC)   Bipolar I disorder (HCC)   COPD (chronic obstructive pulmonary disease) (HCC)   Pressure injury of skin   Acute metabolic encephalopathy/delirium -multifactorial including hypercapnia, UTI, CAP, underlying dementia, bipolar disorder and intellectual disability. Reportedly waxing or waning suggesting delirium. She was also on steroids for COPD exacerbation which could have contributed. CT head, RPR, B12 and TSH without acute finding -Delirium precautions -Minimize  sedating meds. -Psychiatry -Depakote 500 mg twice daily.  CT and MRI brain unremarkable  Acute on chronic hypoxic and hypercapnic respiratory failure due to COPD exacerbation and CAP -resolved. Patient is on trilogy vent and oxygen 2 L as needed at baseline -Completed 5 days of doxycycline and 3 days of ceftriaxone -Completed course of systemic steroid -Apparently, SNF does not take patients with trilogy vent. So, she will need BiPAP on discharge.  -ProCal= neg, BNP= WNL  Chronic atrial fibrillation -TTE without significant finding. -Continue Eliquis twice daily.  Cardizem 240 mg daily  Dysphagia: -Dys II diet per SLP -Aspiration precautions  UTI -completed antibiotic course with ceftriaxone and Keflex  Hypertension -Blood pressure improving.  Continue Cardizem as above.  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  Labile -Cont ISS Accuchecks. Reduce Lantus to 15U daily.   History of bipolar disorder -Psychiatry decreased Depakote to 500 mg twice daily (home dose) in the setting of thrombocytopenia. -Patient is also on loxapine 20 mg daily -Outpatient follow-up with psychiatry  History of DVT -Continue Eliquis.  Acute urinary retention: resolved -Renal ultrasound unremarkable. Foley has been removed and per nursing, she has been voiding without any trouble.   Pressure injury of the right buttock stage II: Not present on admission -Continue local wound care  Thrombocytopenia -Resolved. Hematology signed off. May follow-up as outpatient with hematology if needed  Obesity -Outpatient follow-up  Generalized conditioning -Palliative care evaluation appreciated: Recommend outpatient palliative care follow-up.  Overall prognosis is poor   DVT prophylaxis:  apixaban (ELIQUIS) tablet 5 mg  Code Status: DNR Family Communication:    Status is: Inpatient  Remains inpatient appropriate because:Unsafe d/c plan   Dispo: The patient is from: Home               Anticipated d/c is to: SNF  Patient currently is medically stable to d/c.   Difficult to place patient Yes.  TOC to working on placement/safe disposition with her daughter   Body mass index is 38.48 kg/m.  Pressure Injury 07/21/20 Buttocks Right Stage 2 -  Partial thickness loss of dermis presenting as a shallow open injury with a red, pink wound bed without slough. 1*1 stage 2 pressure injury (Active)  07/21/20 1637  Location: Buttocks  Location Orientation: Right  Staging: Stage 2 -  Partial thickness loss of dermis presenting as a shallow open injury with a red, pink wound bed without slough.  Wound Description (Comments): 1*1 stage 2 pressure injury  Present on Admission: No     Subjective: Spoke with the daughter yesterday, there is some issue with paperwork with missing date and phone number.  Awaiting to hear back regarding SNF.  No complaints laying in the bed  Examination: Constitutional: Not in acute distress, 4 L nasal cannula.  Chronically ill-appearing Respiratory: Mild bibasilar crackles Cardiovascular: Normal sinus rhythm, no rubs Abdomen: Nontender nondistended good bowel sounds Musculoskeletal: No edema noted Skin: No rashes seen Neurologic: CN 2-12 grossly intact.  And nonfocal Psychiatric: Alert to name only.  Poor judgment and insight  External female catheter in place  Objective: Vitals:   07/30/20 2313 07/31/20 1422 07/31/20 2126 08/01/20 0546  BP: (!) 141/94 (!) 154/72 (!) 171/76 (!) 173/67  Pulse: 95 89 84 98  Resp: 16  16 16   Temp: 98.5 F (36.9 C) 98.2 F (36.8 C) 98.3 F (36.8 C) 98.2 F (36.8 C)  TempSrc: Oral Oral    SpO2: 93% 98% 98% 95%  Weight:      Height:        Intake/Output Summary (Last 24 hours) at 08/01/2020 0829 Last data filed at 07/31/2020 2136 Gross per 24 hour  Intake 50 ml  Output 300 ml  Net -250 ml   Filed Weights   07/07/20 1731  Weight: 104.9 kg     Data Reviewed:   CBC: Recent Labs  Lab  07/25/20 0800 07/29/20 0548 07/30/20 0256 07/31/20 0310 08/01/20 0149  WBC 4.9 6.5 7.1 5.8 6.7  NEUTROABS 3.0 4.2  --   --   --   HGB 11.1* 11.6* 11.4* 11.4* 11.4*  HCT 34.9* 35.8* 36.9 35.6* 34.7*  MCV 95.4 95.0 96.9 95.7 94.6  PLT 164 165 177 176 163   Basic Metabolic Panel: Recent Labs  Lab 07/28/20 0302 07/29/20 0548 07/30/20 0256 07/31/20 0310 08/01/20 0149  NA 142 141 141 138 137  K 3.7 3.5 3.5 3.8 3.4*  CL 91* 90* 92* 88* 89*  CO2 42* 44* 41* 44* 41*  GLUCOSE 219* 159* 35* 170* 230*  BUN 20 20 21 22 18   CREATININE 0.52 0.36* 0.31* 0.46 0.36*  CALCIUM 8.4* 8.6* 8.5* 8.3* 8.6*  MG 1.5* 1.6* 1.7 1.8 1.6*   GFR: Estimated Creatinine Clearance: 82.1 mL/min (A) (by C-G formula based on SCr of 0.36 mg/dL (L)). Liver Function Tests: No results for input(s): AST, ALT, ALKPHOS, BILITOT, PROT, ALBUMIN in the last 168 hours. No results for input(s): LIPASE, AMYLASE in the last 168 hours. No results for input(s): AMMONIA in the last 168 hours. Coagulation Profile: No results for input(s): INR, PROTIME in the last 168 hours. Cardiac Enzymes: No results for input(s): CKTOTAL, CKMB, CKMBINDEX, TROPONINI in the last 168 hours. BNP (last 3 results) No results for input(s): PROBNP in the last 8760 hours. HbA1C: No results for input(s): HGBA1C in the last 72  hours. CBG: Recent Labs  Lab 07/31/20 1233 07/31/20 1550 07/31/20 2109 08/01/20 0316 08/01/20 0749  GLUCAP 240* 233* 211* 180* 169*   Lipid Profile: No results for input(s): CHOL, HDL, LDLCALC, TRIG, CHOLHDL, LDLDIRECT in the last 72 hours. Thyroid Function Tests: No results for input(s): TSH, T4TOTAL, FREET4, T3FREE, THYROIDAB in the last 72 hours. Anemia Panel: No results for input(s): VITAMINB12, FOLATE, FERRITIN, TIBC, IRON, RETICCTPCT in the last 72 hours. Sepsis Labs: Recent Labs  Lab 07/30/20 0256  PROCALCITON <0.10    No results found for this or any previous visit (from the past 240 hour(s)).        Radiology Studies: No results found.      Scheduled Meds: . apixaban  5 mg Oral BID  . atorvastatin  20 mg Oral QHS  . bethanechol  25 mg Oral TID  . Chlorhexidine Gluconate Cloth  6 each Topical Daily  . diltiazem  240 mg Oral Daily  . divalproex  500 mg Oral Q12H  . fluticasone furoate-vilanterol  1 puff Inhalation Daily  . insulin aspart  0-5 Units Subcutaneous QHS  . insulin aspart  0-6 Units Subcutaneous TID WC  . insulin detemir  15 Units Subcutaneous Daily  . loxapine  20 mg Oral QHS  . melatonin  5 mg Oral QHS  . pantoprazole  40 mg Oral Daily  . polyvinyl alcohol  1 drop Both Eyes Q breakfast  . potassium chloride  40 mEq Oral Once   Continuous Infusions:   LOS: 32 days   Time spent= 35 mins    Vanisha Whiten Joline Maxcy, MD [facility]  If 7PM-7AM, please contact night-coverage  08/01/2020, 8:29 AM

## 2020-08-01 NOTE — Plan of Care (Signed)
  Problem: Health Behavior/Discharge Planning: Goal: Ability to manage health-related needs will improve Outcome: Progressing   Problem: Clinical Measurements: Goal: Ability to maintain clinical measurements within normal limits will improve Outcome: Progressing   Problem: Nutrition: Goal: Adequate nutrition will be maintained Outcome: Progressing   Problem: Pain Managment: Goal: General experience of comfort will improve Outcome: Progressing   

## 2020-08-02 ENCOUNTER — Inpatient Hospital Stay (HOSPITAL_COMMUNITY): Payer: Medicare Other

## 2020-08-02 DIAGNOSIS — J441 Chronic obstructive pulmonary disease with (acute) exacerbation: Secondary | ICD-10-CM | POA: Diagnosis not present

## 2020-08-02 LAB — BASIC METABOLIC PANEL
Anion gap: 14 (ref 5–15)
BUN: 29 mg/dL — ABNORMAL HIGH (ref 8–23)
CO2: 35 mmol/L — ABNORMAL HIGH (ref 22–32)
Calcium: 8.8 mg/dL — ABNORMAL LOW (ref 8.9–10.3)
Chloride: 89 mmol/L — ABNORMAL LOW (ref 98–111)
Creatinine, Ser: 0.86 mg/dL (ref 0.44–1.00)
GFR, Estimated: >60 mL/min (ref >60)
Glucose, Bld: 184 mg/dL — ABNORMAL HIGH (ref 70–99)
Potassium: 2.8 mmol/L — ABNORMAL LOW (ref 3.5–5.1)
Sodium: 138 mmol/L (ref 135–145)

## 2020-08-02 LAB — CBC
HCT: 40.8 % (ref 36.0–46.0)
Hemoglobin: 13.3 g/dL (ref 12.0–15.0)
MCH: 30.6 pg (ref 26.0–34.0)
MCHC: 32.6 g/dL (ref 30.0–36.0)
MCV: 93.8 fL (ref 80.0–100.0)
Platelets: 231 10*3/uL (ref 150–400)
RBC: 4.35 MIL/uL (ref 3.87–5.11)
RDW: 14.4 % (ref 11.5–15.5)
WBC: 11.2 10*3/uL — ABNORMAL HIGH (ref 4.0–10.5)
nRBC: 0 % (ref 0.0–0.2)

## 2020-08-02 LAB — GLUCOSE, CAPILLARY
Glucose-Capillary: 192 mg/dL — ABNORMAL HIGH (ref 70–99)
Glucose-Capillary: 137 mg/dL — ABNORMAL HIGH (ref 70–99)
Glucose-Capillary: 148 mg/dL — ABNORMAL HIGH (ref 70–99)
Glucose-Capillary: 170 mg/dL — ABNORMAL HIGH (ref 70–99)
Glucose-Capillary: 130 mg/dL — ABNORMAL HIGH (ref 70–99)

## 2020-08-02 LAB — MAGNESIUM: Magnesium: 1.9 mg/dL (ref 1.7–2.4)

## 2020-08-02 LAB — BRAIN NATRIURETIC PEPTIDE: B Natriuretic Peptide: 42.8 pg/mL (ref 0.0–100.0)

## 2020-08-02 MED ORDER — POTASSIUM CHLORIDE 10 MEQ/100ML IV SOLN
10.0000 meq | INTRAVENOUS | Status: AC
  Administered 2020-08-02 (×2): 10 meq via INTRAVENOUS
  Filled 2020-08-02 (×2): qty 100

## 2020-08-02 MED ORDER — IPRATROPIUM-ALBUTEROL 0.5-2.5 (3) MG/3ML IN SOLN
3.0000 mL | Freq: Four times a day (QID) | RESPIRATORY_TRACT | Status: DC
  Administered 2020-08-02 (×2): 3 mL via RESPIRATORY_TRACT
  Filled 2020-08-02 (×2): qty 3

## 2020-08-02 MED ORDER — POTASSIUM CHLORIDE 10 MEQ/100ML IV SOLN
10.0000 meq | INTRAVENOUS | Status: AC
  Administered 2020-08-02 (×3): 10 meq via INTRAVENOUS
  Filled 2020-08-02 (×3): qty 100

## 2020-08-02 MED ORDER — IPRATROPIUM-ALBUTEROL 0.5-2.5 (3) MG/3ML IN SOLN
3.0000 mL | Freq: Three times a day (TID) | RESPIRATORY_TRACT | Status: DC
  Filled 2020-08-02: qty 3

## 2020-08-02 NOTE — Progress Notes (Signed)
PROGRESS NOTE    Stephanie Rice  XLK:[id number] DOB: 1956/01/05 DOA: 06/30/2020 PCP: [name], PA-C   Brief Narrative:  65 year old female with history of dementia, intellectual disability, COPD/chronic hypoxic respite failure on trilogy vent and 2 L oxygen as needed, recent DVT, obesity, diabetes mellitus type 2, bipolar disorder and hypertension was brought to the ED from SNF due to altered mental status and admitted for acute metabolic encephalopathy in the setting of hypercapnia from acute on chronic respiratory failure and COPD exacerbation and UTI.  She was treated with steroids, antibiotics and BiPAP with improvement in ABG but waxing and waning mental status and there was some concerns regarding behavioral issues.  Psychiatry was consulted and gave recommendations. Hospital course was complicated by acute thrombocytopenia and acute urinary retention.  Hematology was consulted for thrombocytopenia.  Psychiatry also adjusted psych medications which could potentially contribute to thrombocytopenia.  Her platelets have improved.  Oncology is following peripherally as well.  Patient has remained stable over the last several days waiting for insurance approval for SNF.  Daughter has appealed the discharge process, waiting for final decision from insurance.  Assessment & Plan:   Principal Problem:   COPD with acute exacerbation (HCC) Active Problems:   Diabetes mellitus type 2, controlled (HCC)   Dementia (HCC)   Bipolar I disorder (HCC)   COPD (chronic obstructive pulmonary disease) (HCC)   Pressure injury of skin   Acute metabolic encephalopathy/delirium -multifactorial including hypercapnia, UTI, CAP, underlying dementia, bipolar disorder and intellectual disability. Reportedly waxing or waning suggesting delirium. She was also on steroids for COPD exacerbation which could have contributed. CT head, RPR, B12 and TSH without acute finding -Delirium precautions -Minimize  sedating meds. -Psychiatry -Depakote 500 mg twice daily.  CT and MRI brain unremarkable  Acute on chronic hypoxic and hypercapnic respiratory failure due to COPD exacerbation and CAP -Slightly worsening. Patient is on trilogy vent and oxygen 2 L as needed at baseline. Now on 5L HF -Completed 5 days of doxycycline and 3 days of ceftriaxone -Completed course of systemic steroid -Apparently, SNF does not take patients with trilogy vent. So, she will need BiPAP on discharge.  -Repeat chest x-ray -Bronchodilators, incentive spirometer, out of bed to chair -ProCal= neg, BNP= WNL  Chronic atrial fibrillation -TTE without significant finding. -Continue Eliquis twice daily.  Cardizem 240 mg daily  Dysphagia: -Dys II diet per SLP -Aspiration precautions  UTI -completed antibiotic course with ceftriaxone and Keflex  Hypertension -Blood pressure improving.  Continue Cardizem as above.  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  Labile -Cont ISS Accuchecks. Reduce Lantus to 15U daily.   History of bipolar disorder -Psychiatry decreased Depakote to 500 mg twice daily (home dose) in the setting of thrombocytopenia. -Patient is also on loxapine 20 mg daily -Outpatient follow-up with psychiatry  History of DVT -Continue Eliquis.  Acute urinary retention: resolved -Renal ultrasound unremarkable. Foley has been removed and per nursing, she has been voiding without any trouble.   Pressure injury of the right buttock stage II: Not present on admission -Continue local wound care  Thrombocytopenia -Resolved. Hematology signed off. May follow-up as outpatient with hematology if needed  Obesity -Outpatient follow-up  Generalized conditioning -Palliative care evaluation appreciated: Recommend outpatient palliative care follow-up.  Overall prognosis is poor   DVT prophylaxis:  apixaban (ELIQUIS) tablet 5 mg  Code Status: DNR Family Communication:    Status is: Inpatient  Remains  inpatient appropriate because:Unsafe d/c plan   Dispo: The patient is from: Home  Anticipated d/c is to: SNF              Patient currently is medically stable to d/c.   Difficult to place patient Yes.  TOC to working on placement/safe disposition with her daughter   Body mass index is 38.48 kg/m.  Pressure Injury 07/21/20 Buttocks Right Stage 2 -  Partial thickness loss of dermis presenting as a shallow open injury with a red, pink wound bed without slough. 1*1 stage 2 pressure injury (Active)  07/21/20 1637  Location: Buttocks  Location Orientation: Right  Staging: Stage 2 -  Partial thickness loss of dermis presenting as a shallow open injury with a red, pink wound bed without slough.  Wound Description (Comments): 1*1 stage 2 pressure injury  Present on Admission: No     Subjective: Saw the patient at bedside, no complaints. She was sleeping comfortably. On 5L HF. Denied sob.   Examination: Constitutional: Not in acute distress; elderly. 5l [place] Respiratory: mild b/l rhonchi.  Cardiovascular: Normal sinus rhythm, no rubs Abdomen: Nontender nondistended good bowel sounds Musculoskeletal: No edema noted Skin: No rashes seen Neurologic: CN 2-12 grossly intact.  And nonfocal Psychiatric: AAox1, poor judgment and insight.      External female catheter in place  Objective: Vitals:   08/01/20 2035 08/02/20 0455 08/02/20 0553 08/02/20 0809  BP: (!) 159/101 (!) 149/74 134/87   Pulse: (!) 105 (!) 102 (!) 103   Resp: 20 20 19    Temp: 99.2 F (37.3 C)  98.1 F (36.7 C)   TempSrc:      SpO2: 98%  95% 93%  Weight:      Height:       No intake or output data in the 24 hours ending 08/02/20 0841 Filed Weights   07/07/20 1731  Weight: 104.9 kg     Data Reviewed:   CBC: Recent Labs  Lab 07/29/20 0548 07/30/20 0256 07/31/20 0310 08/01/20 0149 08/02/20 0113  WBC 6.5 7.1 5.8 6.7 11.2*  NEUTROABS 4.2  --   --   --   --   HGB 11.6* 11.4* 11.4* 11.4*  13.3  HCT 35.8* 36.9 35.6* 34.7* 40.8  MCV 95.0 96.9 95.7 94.6 93.8  PLT 165 177 176 163 231   Basic Metabolic Panel: Recent Labs  Lab 07/29/20 0548 07/30/20 0256 07/31/20 0310 08/01/20 0149 08/02/20 0113  NA 141 141 138 137 138  K 3.5 3.5 3.8 3.4* 2.8*  CL 90* 92* 88* 89* 89*  CO2 44* 41* 44* 41* 35*  GLUCOSE 159* 35* 170* 230* 184*  BUN 20 21 22 18  29*  CREATININE 0.36* 0.31* 0.46 0.36* 0.86  CALCIUM 8.6* 8.5* 8.3* 8.6* 8.8*  MG 1.6* 1.7 1.8 1.6* 1.9   GFR: Estimated Creatinine Clearance: 76.4 mL/min (by C-G formula based on SCr of 0.86 mg/dL). Liver Function Tests: No results for input(s): AST, ALT, ALKPHOS, BILITOT, PROT, ALBUMIN in the last 168 hours. No results for input(s): LIPASE, AMYLASE in the last 168 hours. No results for input(s): AMMONIA in the last 168 hours. Coagulation Profile: No results for input(s): INR, PROTIME in the last 168 hours. Cardiac Enzymes: No results for input(s): CKTOTAL, CKMB, CKMBINDEX, TROPONINI in the last 168 hours. BNP (last 3 results) No results for input(s): PROBNP in the last 8760 hours. HbA1C: No results for input(s): HGBA1C in the last 72 hours. CBG: Recent Labs  Lab 08/01/20 0927 08/01/20 1149 08/01/20 1604 08/01/20 1943 08/02/20 0805  GLUCAP 227* 266* 144* 168* 192*  Lipid Profile: No results for input(s): CHOL, HDL, LDLCALC, TRIG, CHOLHDL, LDLDIRECT in the last 72 hours. Thyroid Function Tests: No results for input(s): TSH, T4TOTAL, FREET4, T3FREE, THYROIDAB in the last 72 hours. Anemia Panel: No results for input(s): VITAMINB12, FOLATE, FERRITIN, TIBC, IRON, RETICCTPCT in the last 72 hours. Sepsis Labs: Recent Labs  Lab 07/30/20 0256  PROCALCITON <0.10    No results found for this or any previous visit (from the past 240 hour(s)).       Radiology Studies: No results found.      Scheduled Meds: . apixaban  5 mg Oral BID  . atorvastatin  20 mg Oral QHS  . bethanechol  25 mg Oral TID  .  Chlorhexidine Gluconate Cloth  6 each Topical Daily  . diltiazem  240 mg Oral Daily  . divalproex  500 mg Oral Q12H  . fluticasone furoate-vilanterol  1 puff Inhalation Daily  . insulin aspart  0-5 Units Subcutaneous QHS  . insulin aspart  0-6 Units Subcutaneous TID WC  . insulin detemir  15 Units Subcutaneous Daily  . ipratropium-albuterol  3 mL Nebulization Q6H  . loxapine  20 mg Oral QHS  . melatonin  5 mg Oral QHS  . pantoprazole  40 mg Oral Daily  . polyvinyl alcohol  1 drop Both Eyes Q breakfast   Continuous Infusions: . potassium chloride 10 mEq (08/02/20 0840)  . potassium chloride       LOS: 33 days   Time spent= 35 mins    Gearldean Lomanto Joline Maxcy, MD [facility]  If 7PM-7AM, please contact night-coverage  08/02/2020, 8:41 AM

## 2020-08-03 DIAGNOSIS — J441 Chronic obstructive pulmonary disease with (acute) exacerbation: Secondary | ICD-10-CM | POA: Diagnosis not present

## 2020-08-03 LAB — CBC
HCT: 38.8 % (ref 36.0–46.0)
Hemoglobin: 12.1 g/dL (ref 12.0–15.0)
MCH: 30.9 pg (ref 26.0–34.0)
MCHC: 31.2 g/dL (ref 30.0–36.0)
MCV: 99 fL (ref 80.0–100.0)
Platelets: 154 10*3/uL (ref 150–400)
RBC: 3.92 MIL/uL (ref 3.87–5.11)
RDW: 14.6 % (ref 11.5–15.5)
WBC: 8.8 10*3/uL (ref 4.0–10.5)
nRBC: 0 % (ref 0.0–0.2)

## 2020-08-03 LAB — BASIC METABOLIC PANEL
Anion gap: 12 (ref 5–15)
BUN: 33 mg/dL — ABNORMAL HIGH (ref 8–23)
CO2: 27 mmol/L (ref 22–32)
Calcium: 8 mg/dL — ABNORMAL LOW (ref 8.9–10.3)
Chloride: 97 mmol/L — ABNORMAL LOW (ref 98–111)
Creatinine, Ser: 0.52 mg/dL (ref 0.44–1.00)
GFR, Estimated: >60 mL/min (ref >60)
Glucose, Bld: 113 mg/dL — ABNORMAL HIGH (ref 70–99)
Potassium: 4.6 mmol/L (ref 3.5–5.1)
Sodium: 136 mmol/L (ref 135–145)

## 2020-08-03 LAB — GLUCOSE, CAPILLARY
Glucose-Capillary: 111 mg/dL — ABNORMAL HIGH (ref 70–99)
Glucose-Capillary: 122 mg/dL — ABNORMAL HIGH (ref 70–99)
Glucose-Capillary: 180 mg/dL — ABNORMAL HIGH (ref 70–99)
Glucose-Capillary: 244 mg/dL — ABNORMAL HIGH (ref 70–99)

## 2020-08-03 LAB — MAGNESIUM: Magnesium: 1.8 mg/dL (ref 1.7–2.4)

## 2020-08-03 NOTE — Progress Notes (Signed)
Physical Therapy Treatment Patient Details Name: Stephanie Rice MRN: [id number] DOB: 1955/09/01 Today's Date: 08/03/2020    History of Present Illness Stephanie Rice is a 65 y.o. female with medical history significant of chronic hypoxic and hypercapnic respiratory failure secondary COPD, on Trelegy home vent PRN +2 L PRN, HTN, recent diagnosed DVT, morbid obesity, IDDM, Dementia, bipolar disorder, presented with altered mentation. episodes requiring BiPAP; episodes of afib    PT Comments    Patient able to sit EOB and reach for items within her base of support with minguard to min assist x 15 minutes. Patient motivated by dessert (magic cup) and even self fed herself. Able to progress to lateral scoot transfer along EOB with pt participating/bearing weight. Noted still awaiting appeal decision from her insurance. ?Daughter may decide to take pt home?    Follow Up Recommendations  SNF;Supervision/Assistance - 24 hour     Equipment Recommendations  Other (comment) (hoyer with hoyer pad (noted daughter does not want w/c and insurance won't pay for another hospital bed))    Recommendations for Other Services       Precautions / Restrictions Precautions Precautions: Fall Precaution Comments: monitor O2, delirium    Mobility  Bed Mobility Overal bed mobility: Needs Assistance Bed Mobility: Rolling;Sidelying to Sit;Sit to Sidelying Rolling: Mod assist Sidelying to sit: +2 for safety/equipment;Mod assist     Sit to sidelying: Max assist;+2 for physical assistance General bed mobility comments: pt able to follow commands to assist better today; fatigued after prolonged sitting and required incr assist to return to side/supine    Transfers Overall transfer level: Needs assistance   Transfers: Lateral/Scoot Transfers          Lateral/Scoot Transfers: Mod assist;+2 physical assistance General transfer comment: 3 lateral scoots to her left toward [facility]  Ambulation/Gait                  Stairs             Wheelchair Mobility    Modified Rankin (Stroke Patients Only)       Balance Overall balance assessment: Needs assistance Sitting-balance support: Feet supported;Single extremity supported Sitting balance-Leahy Scale: Poor Sitting balance - Comments: majority of time used LUE for support, but able to maintian midline without UE assist for up to 20 seconds at a time                                    Cognition Arousal/Alertness: Awake/alert Behavior During Therapy: WFL for tasks assessed/performed Overall Cognitive Status: History of cognitive impairments - at baseline Area of Impairment: Attention;Following commands;Problem solving;Memory;Safety/judgement;Awareness;Orientation                 Orientation Level: Disoriented to;Place;Situation;Time Current Attention Level: Sustained Memory: Decreased short-term memory Following Commands: Follows one step commands with increased time;Follows one step commands inconsistently Safety/Judgement: Decreased awareness of safety;Decreased awareness of deficits Awareness: Intellectual Problem Solving: Slow processing;Decreased initiation;Difficulty sequencing;Requires verbal cues;Requires tactile cues General Comments: follows one step commands consistently      Exercises      General Comments        Pertinent Vitals/Pain Pain Assessment: Faces Faces Pain Scale: No hurt    Home Living                      Prior Function            PT Goals (current goals  can now be found in the care plan section) Acute Rehab PT Goals Patient Stated Goal: eat lunch Time For Goal Achievement: 08/13/20 Progress towards PT goals: Progressing toward goals    Frequency    Min 2X/week      PT Plan Current plan remains appropriate    Co-evaluation              AM-PAC PT "6 Clicks" Mobility   Outcome Measure  Help needed turning from your back to your side  while in a flat bed without using bedrails?: A Lot Help needed moving from lying on your back to sitting on the side of a flat bed without using bedrails?: A Lot Help needed moving to and from a bed to a chair (including a wheelchair)?: Total Help needed standing up from a chair using your arms (e.g., wheelchair or bedside chair)?: Total Help needed to walk in hospital room?: Total Help needed climbing 3-5 steps with a railing? : Total 6 Click Score: 8    End of Session Equipment Utilized During Treatment: Oxygen Activity Tolerance: Patient limited by fatigue Patient left: in bed;with call bell/phone within reach;with bed alarm set   PT Visit Diagnosis: Other abnormalities of gait and mobility (R26.89)     Time: 7408-1448 PT Time Calculation (min) (ACUTE ONLY): 23 min  Charges:  $Therapeutic Activity: 23-37 mins                      Stephanie Rice, PT Pager [phone]    Zena Amos 08/03/2020, 3:26 PM

## 2020-08-03 NOTE — TOC Progression Note (Addendum)
Transition of Care [facility]) - Progression Note    Patient Details  Name: Stephanie Rice MRN: [id number] Date of Birth: 21-Mar-1956  [facility] of [facility]) CM/SW Contact  [name], LCSW Phone Number: 08/03/2020, 2:06 PM  Clinical Narrative:   CSW spoke with [facility] for update on appeal.  Today, CSW is told that the appeal continues to be pending, no decision has been made, and the time frame for decision is 30 days from the appeal date on 07/15/20.  1550: CSW received call from Amy, discharge case mgr at [facility].  She has been monitoring this appeal, was expecting resolution by 3/18.  She will be getting involved and will try to get resolution.  She will call back tomorrow with update.  Her direct  Number: [phone].      Expected Discharge Plan: Skilled Nursing Facility Barriers to Discharge: Continued Medical Work up  Expected Discharge Plan and Services Expected Discharge Plan: Skilled Nursing Facility     Post Acute Care Choice:  (daughter requesting LTAC) Living arrangements for the past 2 months: Skilled Nursing Facility (was hospitalized at [facility] for 30+ days prior to SNF)                           HH Arranged: PT,OT,Nurse's Aide HH Agency: [facility] Date [facility] Agency Contacted: 07/14/20 Time HH Agency Contacted: 1300 Representative spoke with at [facility] Agency: Kandee Keen   Social Determinants of Health (SDOH) Interventions    Readmission Risk Interventions No flowsheet data found.

## 2020-08-03 NOTE — Progress Notes (Signed)
PROGRESS NOTE    Stephanie Rice  TMH:[id number] DOB: 09-20-1955 DOA: 06/30/2020 PCP: [name], PA-C   Brief Narrative:  65 year old female with history of dementia, intellectual disability, COPD/chronic hypoxic respite failure on trilogy vent and 2 L oxygen as needed, recent DVT, obesity, diabetes mellitus type 2, bipolar disorder and hypertension was brought to the ED from SNF due to altered mental status and admitted for acute metabolic encephalopathy in the setting of hypercapnia from acute on chronic respiratory failure and COPD exacerbation and UTI.  She was treated with steroids, antibiotics and BiPAP with improvement in ABG but waxing and waning mental status and there was some concerns regarding behavioral issues.  Psychiatry was consulted and gave recommendations. Hospital course was complicated by acute thrombocytopenia and acute urinary retention.  Hematology was consulted for thrombocytopenia.  Psychiatry also adjusted psych medications which could potentially contribute to thrombocytopenia.  Her platelets have improved.  Oncology is following peripherally as well.  Patient has remained stable over the last several days waiting for insurance approval for SNF.  Daughter has appealed the discharge process, waiting for final decision from insurance.  Assessment & Plan:   Principal Problem:   COPD with acute exacerbation (HCC) Active Problems:   Diabetes mellitus type 2, controlled (HCC)   Dementia (HCC)   Bipolar I disorder (HCC)   COPD (chronic obstructive pulmonary disease) (HCC)   Pressure injury of skin   Acute metabolic encephalopathy/delirium- Intermitten -multifactorial including hypercapnia, UTI, CAP, underlying dementia, bipolar disorder and intellectual disability. Reportedly waxing or waning suggesting delirium. She was also on steroids for COPD exacerbation which could have contributed. CT head, RPR, B12 and TSH without acute finding -Delirium  precautions -Minimize sedating meds. -Psychiatry -Depakote 500 mg twice daily.  CT and MRI brain unremarkable  Acute on chronic hypoxic and hypercapnic respiratory failure due to COPD exacerbation and CAP -Slightly worsening. Patient is on trilogy vent and oxygen 2 L as needed at baseline. Now between 4-5L [place] -Completed 5 days of doxycycline and 3 days of ceftriaxone -Completed course of systemic steroid -Apparently, SNF does not take patients with trilogy vent. So, she will need BiPAP on discharge.  -Bronchodilators, incentive spirometer, out of bed to chair -ProCal= neg, BNP= WNL  Chronic atrial fibrillation -TTE without significant finding. -Continue Eliquis twice daily.  Cardizem 240 mg daily  Dysphagia: -Dys II diet per SLP -Aspiration precautions  UTI -completed antibiotic course with ceftriaxone and Keflex  Hypertension -Blood pressure improving.  Continue Cardizem as above.  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  Labile -Cont ISS Accuchecks. Reduce Lantus to 15U daily.   History of bipolar disorder -Psychiatry decreased Depakote to 500 mg twice daily (home dose) in the setting of thrombocytopenia. -Patient is also on loxapine 20 mg daily -Outpatient follow-up with psychiatry  History of DVT -Continue Eliquis.  Acute urinary retention: resolved -Renal ultrasound unremarkable. Foley has been removed and per nursing, she has been voiding without any trouble.   Pressure injury of the right buttock stage II: Not present on admission -Continue local wound care  Thrombocytopenia -Resolved. Hematology signed off. May follow-up as outpatient with hematology if needed  Obesity -Outpatient follow-up  Generalized conditioning -Palliative care evaluation appreciated: Recommend outpatient palliative care follow-up.  Overall prognosis is poor   DVT prophylaxis:  apixaban (ELIQUIS) tablet 5 mg  Code Status: DNR Family Communication:  Family updated  periodically.   Status is: Inpatient  Remains inpatient appropriate because:Unsafe d/c plan   Dispo: The patient is from: Home  Anticipated d/c is to: SNF              Patient currently is medically stable to d/c.   Difficult to place patient Yes.  TOC to working on placement/safe disposition with her daughter   Body mass index is 38.48 kg/m.  Pressure Injury 07/21/20 Buttocks Right Stage 2 -  Partial thickness loss of dermis presenting as a shallow open injury with a red, pink wound bed without slough. 1*1 stage 2 pressure injury (Active)  07/21/20 1637  Location: Buttocks  Location Orientation: Right  Staging: Stage 2 -  Partial thickness loss of dermis presenting as a shallow open injury with a red, pink wound bed without slough.  Wound Description (Comments): 1*1 stage 2 pressure injury  Present on Admission: No     Subjective: Doing ok no new complaints.   Examination: Constitutional: Not in acute distress; 5L HFNC Respiratory: bibasilar rhonchi. Cardiovascular: Normal sinus rhythm, no rubs Abdomen: Nontender nondistended good bowel sounds Musculoskeletal: No edema noted Skin: No rashes seen Neurologic: CN 2-12 grossly intact.  And nonfocal Psychiatric: AAOx 1-2 (baseline). Poor judgement and insightn   External female catheter in place  Objective: Vitals:   08/02/20 1423 08/02/20 2009 08/02/20 2213 08/03/20 0338  BP:   (!) 121/56 106/88  Pulse:   79 85  Resp:   16 20  Temp:   98.5 F (36.9 C) 97.8 F (36.6 C)  TempSrc:   Oral   SpO2: 96% 97% 96% 97%  Weight:      Height:        Intake/Output Summary (Last 24 hours) at 08/03/2020 1143 Last data filed at 08/03/2020 0430 Gross per 24 hour  Intake -  Output 450 ml  Net -450 ml   Filed Weights   07/07/20 1731  Weight: 104.9 kg     Data Reviewed:   CBC: Recent Labs  Lab 07/29/20 0548 07/30/20 0256 07/31/20 0310 08/01/20 0149 08/02/20 0113 08/03/20 0256  WBC 6.5 7.1 5.8 6.7  11.2* 8.8  NEUTROABS 4.2  --   --   --   --   --   HGB 11.6* 11.4* 11.4* 11.4* 13.3 12.1  HCT 35.8* 36.9 35.6* 34.7* 40.8 38.8  MCV 95.0 96.9 95.7 94.6 93.8 99.0  PLT 165 177 176 163 231 154   Basic Metabolic Panel: Recent Labs  Lab 07/30/20 0256 07/31/20 0310 08/01/20 0149 08/02/20 0113 08/03/20 0256  NA 141 138 137 138 136  K 3.5 3.8 3.4* 2.8* 4.6  CL 92* 88* 89* 89* 97*  CO2 41* 44* 41* 35* 27  GLUCOSE 35* 170* 230* 184* 113*  BUN 21 22 18  29* 33*  CREATININE 0.31* 0.46 0.36* 0.86 0.52  CALCIUM 8.5* 8.3* 8.6* 8.8* 8.0*  MG 1.7 1.8 1.6* 1.9 1.8   GFR: Estimated Creatinine Clearance: 82.1 mL/min (by C-G formula based on SCr of 0.52 mg/dL). Liver Function Tests: No results for input(s): AST, ALT, ALKPHOS, BILITOT, PROT, ALBUMIN in the last 168 hours. No results for input(s): LIPASE, AMYLASE in the last 168 hours. No results for input(s): AMMONIA in the last 168 hours. Coagulation Profile: No results for input(s): INR, PROTIME in the last 168 hours. Cardiac Enzymes: No results for input(s): CKTOTAL, CKMB, CKMBINDEX, TROPONINI in the last 168 hours. BNP (last 3 results) No results for input(s): PROBNP in the last 8760 hours. HbA1C: No results for input(s): HGBA1C in the last 72 hours. CBG: Recent Labs  Lab 08/02/20 1227 08/02/20 1708 08/02/20 1719 08/02/20 2210  08/03/20 0846  GLUCAP 137* 170* 148* 130* 111*   Lipid Profile: No results for input(s): CHOL, HDL, LDLCALC, TRIG, CHOLHDL, LDLDIRECT in the last 72 hours. Thyroid Function Tests: No results for input(s): TSH, T4TOTAL, FREET4, T3FREE, THYROIDAB in the last 72 hours. Anemia Panel: No results for input(s): VITAMINB12, FOLATE, FERRITIN, TIBC, IRON, RETICCTPCT in the last 72 hours. Sepsis Labs: Recent Labs  Lab 07/30/20 0256  PROCALCITON <0.10    No results found for this or any previous visit (from the past 240 hour(s)).       Radiology Studies: DG Chest Port 1 View  Result Date:  08/02/2020 CLINICAL DATA:  Dyspnea EXAM: PORTABLE CHEST 1 VIEW COMPARISON:  07/23/2020 FINDINGS: Increased airspace opacity in the left lower lobe. Stable bandlike density at the right lung base. Stable elevation of the right hemidiaphragm. Borderline enlargement of the cardiopericardial silhouette, without edema. Atherosclerotic calcification of the aortic arch. Thoracic and upper lumbar spondylosis. IMPRESSION: 1. Increased airspace opacity in the left lower lobe, suspicious for pneumonia or aspiration pneumonitis. 2. Stable bandlike density at the right lung base with stable elevation of the right hemidiaphragm. 3.  Aortic Atherosclerosis (ICD10-I70.0). Electronically Signed   By: Gaylyn Rong M.D.   On: 08/02/2020 14:05        Scheduled Meds: . apixaban  5 mg Oral BID  . atorvastatin  20 mg Oral QHS  . bethanechol  25 mg Oral TID  . Chlorhexidine Gluconate Cloth  6 each Topical Daily  . diltiazem  240 mg Oral Daily  . divalproex  500 mg Oral Q12H  . fluticasone furoate-vilanterol  1 puff Inhalation Daily  . insulin aspart  0-5 Units Subcutaneous QHS  . insulin aspart  0-6 Units Subcutaneous TID WC  . insulin detemir  15 Units Subcutaneous Daily  . ipratropium-albuterol  3 mL Nebulization TID  . loxapine  20 mg Oral QHS  . melatonin  5 mg Oral QHS  . pantoprazole  40 mg Oral Daily  . polyvinyl alcohol  1 drop Both Eyes Q breakfast   Continuous Infusions:    LOS: 34 days   Time spent= 35 mins    Ankit Joline Maxcy, MD [facility]  If 7PM-7AM, please contact night-coverage  08/03/2020, 11:43 AM

## 2020-08-03 NOTE — Progress Notes (Signed)
Nutrition Follow-up  DOCUMENTATION CODES:   Not applicable  INTERVENTION:   Continue to encourage PO intake  Continue to provide meal assistance  Continue Magic cup TID with meals, each supplement provides 290 kcal and 9 grams of protein  Continue MVI with minerals daily  NUTRITION DIAGNOSIS:   Increased nutrient needs related to acute illness as evidenced by estimated needs.  Ongoing.   GOAL:   Patient will meet greater than or equal to 90% of their needs  Progressing.   MONITOR:   PO intake,Supplement acceptance  REASON FOR ASSESSMENT:   Consult Assessment of nutrition requirement/status  ASSESSMENT:   Patient with PMH significant for chronic hypoxic and hypercapnic respiratory failure 2/2 to COPD, HTN, recent diagnosis of DVT, DM, dementia, and bipolar disorder. Presents this admission with COPD exacerbation.  Intermittent delirium continues. Pt remains on 4-5L [place] O2 via [place]. Appeal to insurance continues to be pending; CM notes that they anticipate receiving an answer tomorrow.   Pt's intake slightly improved; 25-100% meal completion x last 8 recorded meals (69% average meal intake)  UOP: x24 hours  Medications reviewed and include: SSI, levemir Labs reviewed:  CBG's: 122-180-244 (Diabetes Coordinator following)   Diet Order:   Diet Order            DIET DYS 2 Room service appropriate? No; Fluid consistency: Honey Thick; Fluid restriction: 1500 mL Fluid  Diet effective now                 EDUCATION NEEDS:   Not appropriate for education at this time  Skin:  Skin Assessment: Skin Integrity Issues: Skin Integrity Issues:: Stage II Stage II: R buttocks noted on 3/8  Last BM:  3/19  Height:   Ht Readings from Last 1 Encounters:  07/07/20 5\' 5"  (1.651 m)    Weight:   Wt Readings from Last 1 Encounters:  07/07/20 104.9 kg    BMI:  Body mass index is 38.48 kg/m.  Estimated Nutritional Needs:   Kcal:  1800-2000 kcal  Protein:   90-105 grams  Fluid:  >/= 1.8 L/day  07/09/20, MS, RD, LDN RD pager number and weekend/on-call pager number located in Amion.

## 2020-08-03 NOTE — Plan of Care (Signed)
  Problem: Clinical Measurements: Goal: Will remain free from infection Outcome: Progressing Goal: Cardiovascular complication will be avoided Outcome: Progressing   Problem: Education: Goal: Knowledge of General Education information will improve Description: Including pain rating scale, medication(s)/side effects and non-pharmacologic comfort measures Outcome: Not Progressing   Problem: Health Behavior/Discharge Planning: Goal: Ability to manage health-related needs will improve Outcome: Not Progressing   Problem: Clinical Measurements: Goal: Ability to maintain clinical measurements within normal limits will improve Outcome: Not Progressing Goal: Diagnostic test results will improve Outcome: Not Progressing Goal: Respiratory complications will improve Outcome: Not Progressing

## 2020-08-03 NOTE — Progress Notes (Signed)
Patient ID: Stephanie Rice, female   DOB: [birth year], 65 y.o.   MRN: [id number]  Medical records reviewed  This NP attempted to speak with daughter several times for continued conversation regarding goals of care and anticipatory care needs.  Left voicemail, await callback.  Discussed with TOC team         Recommend outpatient community-based palliative services on discharge  No charge    Lorinda Creed NP  Palliative Medicine Team Team Phone # [phone] Pager [phone]

## 2020-08-04 LAB — BASIC METABOLIC PANEL
Anion gap: 7 (ref 5–15)
BUN: 23 mg/dL (ref 8–23)
CO2: 41 mmol/L — ABNORMAL HIGH (ref 22–32)
Calcium: 8.5 mg/dL — ABNORMAL LOW (ref 8.9–10.3)
Chloride: 93 mmol/L — ABNORMAL LOW (ref 98–111)
Creatinine, Ser: 0.43 mg/dL — ABNORMAL LOW (ref 0.44–1.00)
GFR, Estimated: >60 mL/min (ref >60)
Glucose, Bld: 168 mg/dL — ABNORMAL HIGH (ref 70–99)
Potassium: 3.2 mmol/L — ABNORMAL LOW (ref 3.5–5.1)
Sodium: 141 mmol/L (ref 135–145)

## 2020-08-04 LAB — GLUCOSE, CAPILLARY
Glucose-Capillary: 136 mg/dL — ABNORMAL HIGH (ref 70–99)
Glucose-Capillary: 149 mg/dL — ABNORMAL HIGH (ref 70–99)
Glucose-Capillary: 150 mg/dL — ABNORMAL HIGH (ref 70–99)
Glucose-Capillary: 130 mg/dL — ABNORMAL HIGH (ref 70–99)

## 2020-08-04 LAB — CBC
HCT: 40 % (ref 36.0–46.0)
Hemoglobin: 12.3 g/dL (ref 12.0–15.0)
MCH: 30 pg (ref 26.0–34.0)
MCHC: 30.8 g/dL (ref 30.0–36.0)
MCV: 97.6 fL (ref 80.0–100.0)
Platelets: 166 10*3/uL (ref 150–400)
RBC: 4.1 MIL/uL (ref 3.87–5.11)
RDW: 14.2 % (ref 11.5–15.5)
WBC: 4.5 10*3/uL (ref 4.0–10.5)
nRBC: 0 % (ref 0.0–0.2)

## 2020-08-04 LAB — MAGNESIUM: Magnesium: 1.8 mg/dL (ref 1.7–2.4)

## 2020-08-04 MED ORDER — POTASSIUM CHLORIDE 20 MEQ PO PACK
40.0000 meq | PACK | ORAL | Status: AC
  Administered 2020-08-04 (×2): 40 meq via ORAL
  Filled 2020-08-04 (×2): qty 2

## 2020-08-04 NOTE — TOC Progression Note (Addendum)
Transition of Care [facility]) - Progression Note    Patient Details  Name: Sophy Mesler MRN: [id number] Date of Birth: 1955/12/21  Transition of [facility]) CM/SW Contact  [name], LCSW Phone Number: 08/04/2020, 3:23 PM  Clinical Narrative:   CSW received call from [facility] discharge case manager.  She continues to work on the appeal and asked additional questions regarding what has occurred so far.  CSW spoke with pt daughter Joyce Gross and Psychologist, sport and exercise.  CSW informed Joyce Gross of calls from Amy and Joyce Gross stating she does not want to wait longer and wants to move forward on plan for pt to return home.  Joyce Gross raised several issues: 1. Hoyer lift: CSW ordered from [employer] after the phone call, will be delivered tomorrow. 2.  Home health: per Judeth Cornfield, pt is approved for [facility] aide services up to 36 hours per week.  Joyce Gross had questions regarding HH PT/OT services--[facility] in place.  Initially they seemed to be needing an order for [facility] aide but the final word was that they are all set with this and do not need anything from [place]. 3.  Referral to a dietician.  Joyce Gross asking about a referral to an outside dietician and prescriptions for pureed food that are appropriate for pt dysphagia 2 diet.  CSW messaged with MD regarding this: pt was seen by dietician yesterday, no prescription puree food is recommended.  CSW attempting to contact dietician at [facility] to see if she can speak with pt daughter to go over what is required for pt diet at home.    1600: CSW was able to speak with dietician, she referred CSW to speech pathology.      Expected Discharge Plan: Skilled Nursing Facility Barriers to Discharge: Continued Medical Work up  Expected Discharge Plan and Services Expected Discharge Plan: Skilled Nursing Facility     Post Acute Care Choice:  (daughter requesting LTAC) Living arrangements for the past 2 months: Skilled Nursing Facility (was hospitalized at [facility] for 30+ days prior to SNF)                  DME Arranged: Other see comment (hoyer lift)         HH Arranged: PT,OT,Nurse's Aide HH Agency: [facility] Date [facility] Agency Contacted: 07/14/20 Time HH Agency Contacted: 1300 Representative spoke with at [facility] Agency: Kandee Keen   Social Determinants of Health (SDOH) Interventions    Readmission Risk Interventions No flowsheet data found.

## 2020-08-04 NOTE — Plan of Care (Signed)

## 2020-08-04 NOTE — Progress Notes (Signed)
PROGRESS NOTE    Stephanie Rice  WEX:[id number] DOB: 1955-07-02 DOA: 06/30/2020 PCP: [name], PA-C   Brief Narrative:  65 year old female with history of dementia, intellectual disability, COPD/chronic hypoxic respite failure on trilogy vent and 2 L oxygen as needed, recent DVT, obesity, diabetes mellitus type 2, bipolar disorder and hypertension was brought to the ED from SNF due to altered mental status and admitted for acute metabolic encephalopathy in the setting of hypercapnia from acute on chronic respiratory failure and COPD exacerbation and UTI.  She was treated with steroids, antibiotics and BiPAP with improvement in ABG but waxing and waning mental status and there was some concerns regarding behavioral issues.  Psychiatry was consulted and gave recommendations. Hospital course was complicated by acute thrombocytopenia and acute urinary retention.  Hematology was consulted for thrombocytopenia.  Psychiatry also adjusted psych medications which could potentially contribute to thrombocytopenia.  Her platelets have improved.  Oncology is following peripherally as well.  Patient has remained stable over the last several days waiting for insurance approval for SNF.  Daughter has appealed the discharge process, waiting for final decision from insurance.  Assessment & Plan:   Principal Problem:   COPD with acute exacerbation (HCC) Active Problems:   Diabetes mellitus type 2, controlled (HCC)   Dementia (HCC)   Bipolar I disorder (HCC)   COPD (chronic obstructive pulmonary disease) (HCC)   Pressure injury of skin   Acute metabolic encephalopathy/delirium- Intermitten -multifactorial including hypercapnia, UTI, CAP, underlying dementia, bipolar disorder and intellectual disability. Reportedly waxing or waning suggesting delirium. She was also on steroids for COPD exacerbation which could have contributed. CT head, RPR, B12 and TSH without acute finding -Delirium  precautions -Minimize sedating meds. -Psychiatry -Depakote 500 mg twice daily.  CT and MRI brain are unremarkable  Acute on chronic hypoxic and hypercapnic respiratory failure due to COPD exacerbation and CAP -Slightly worsening. Patient is on trilogy vent and oxygen 2 L as needed at baseline.  Still on 4-5L nasal cannula suspect atelectasis.  Advised aggressive use of I-S/flutter -Completed course of doxycycline, Rocephin and steroids. -Apparently, SNF does not take patients with trilogy vent. So, she will need BiPAP on discharge.  -Bronchodilators, incentive spirometer, out of bed to chair -ProCal= neg, BNP= WNL  Chronic atrial fibrillation -TTE without significant finding. -Continue Eliquis twice daily.  Cardizem 240 mg daily  Dysphagia: -Dys II diet per SLP -Aspiration precautions  UTI -completed antibiotic course with ceftriaxone and Keflex  Hypertension -Blood pressure improving.  Continue Cardizem as above.  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  Labile -Cont ISS Accuchecks. Reduce Lantus to 15U daily.   History of bipolar disorder -Psychiatry decreased Depakote to 500 mg twice daily (home dose) in the setting of thrombocytopenia. -Patient is also on loxapine 20 mg daily -Outpatient follow-up with psychiatry  History of DVT -Continue Eliquis.  Acute urinary retention: resolved -Renal ultrasound unremarkable. Foley has been removed and per nursing, she has been voiding without any trouble.   Pressure injury of the right buttock stage II: Not present on admission -Continue local wound care  Thrombocytopenia -Resolved. Hematology signed off. May follow-up as outpatient with hematology if needed  Obesity -Outpatient follow-up  Generalized conditioning -Palliative care evaluation appreciated: Recommend outpatient palliative care follow-up.  Overall prognosis is poor   DVT prophylaxis:  apixaban (ELIQUIS) tablet 5 mg  Code Status: DNR Family  Communication:  Family updated periodically.   Status is: Inpatient  Remains inpatient appropriate because:Unsafe d/c plan   Dispo: The patient is from: Home  Anticipated d/c is to: SNF              Patient currently is medically stable to d/c.     Difficult to place patient Yes.  TOC to working on placement/safe disposition with her daughter   Body mass index is 38.48 kg/m.  Pressure Injury 07/21/20 Buttocks Right Stage 2 -  Partial thickness loss of dermis presenting as a shallow open injury with a red, pink wound bed without slough. 1*1 stage 2 pressure injury (Active)  07/21/20 1637  Location: Buttocks  Location Orientation: Right  Staging: Stage 2 -  Partial thickness loss of dermis presenting as a shallow open injury with a red, pink wound bed without slough.  Wound Description (Comments): 1*1 stage 2 pressure injury  Present on Admission: No     Subjective: Patient is sitting up in bed comfortable on 4 L nasal cannula.  Denies any complaints.  Examination: Constitutional: Not in acute distress, appears chronically ill.  On 4 L nasal cannula Respiratory: Mild bibasilar crackles Cardiovascular: Normal sinus rhythm, no rubs Abdomen: Nontender nondistended good bowel sounds Musculoskeletal: No edema noted Skin: No rashes seen Neurologic: Difficult to perform full neuro exam but is at her baseline.  No focal neuro deficits. Psychiatric: Alert to name only which is her baseline External female catheter in place  Objective: Vitals:   08/03/20 0338 08/03/20 1407 08/03/20 1953 08/04/20 0501  BP: 106/88 (!) 141/62 (!) 167/72 (!) 172/54  Pulse: 85 78 89 85  Resp: 20 18 20 20   Temp: 97.8 F (36.6 C) 97.6 F (36.4 C) 98.2 F (36.8 C) 97.8 F (36.6 C)  TempSrc:  Oral Oral Oral  SpO2: 97% 94% 99% 100%  Weight:      Height:        Intake/Output Summary (Last 24 hours) at 08/04/2020 1113 Last data filed at 08/04/2020 0334 Gross per 24 hour  Intake -   Output 700 ml  Net -700 ml   Filed Weights   07/07/20 1731  Weight: 104.9 kg     Data Reviewed:   CBC: Recent Labs  Lab 07/29/20 0548 07/30/20 0256 07/31/20 0310 08/01/20 0149 08/02/20 0113 08/03/20 0256 08/04/20 0044  WBC 6.5   < > 5.8 6.7 11.2* 8.8 4.5  NEUTROABS 4.2  --   --   --   --   --   --   HGB 11.6*   < > 11.4* 11.4* 13.3 12.1 12.3  HCT 35.8*   < > 35.6* 34.7* 40.8 38.8 40.0  MCV 95.0   < > 95.7 94.6 93.8 99.0 97.6  PLT 165   < > 176 163 231 154 166   < > = values in this interval not displayed.   Basic Metabolic Panel: Recent Labs  Lab 07/31/20 0310 08/01/20 0149 08/02/20 0113 08/03/20 0256 08/04/20 0044  NA 138 137 138 136 141  K 3.8 3.4* 2.8* 4.6 3.2*  CL 88* 89* 89* 97* 93*  CO2 44* 41* 35* 27 41*  GLUCOSE 170* 230* 184* 113* 168*  BUN 22 18 29* 33* 23  CREATININE 0.46 0.36* 0.86 0.52 0.43*  CALCIUM 8.3* 8.6* 8.8* 8.0* 8.5*  MG 1.8 1.6* 1.9 1.8 1.8   GFR: Estimated Creatinine Clearance: 82.1 mL/min (A) (by C-G formula based on SCr of 0.43 mg/dL (L)). Liver Function Tests: No results for input(s): AST, ALT, ALKPHOS, BILITOT, PROT, ALBUMIN in the last 168 hours. No results for input(s): LIPASE, AMYLASE in the last 168 hours. No results  for input(s): AMMONIA in the last 168 hours. Coagulation Profile: No results for input(s): INR, PROTIME in the last 168 hours. Cardiac Enzymes: No results for input(s): CKTOTAL, CKMB, CKMBINDEX, TROPONINI in the last 168 hours. BNP (last 3 results) No results for input(s): PROBNP in the last 8760 hours. HbA1C: No results for input(s): HGBA1C in the last 72 hours. CBG: Recent Labs  Lab 08/03/20 0846 08/03/20 1158 08/03/20 1627 08/03/20 1956 08/04/20 0805  GLUCAP 111* 122* 180* 244* 136*   Lipid Profile: No results for input(s): CHOL, HDL, LDLCALC, TRIG, CHOLHDL, LDLDIRECT in the last 72 hours. Thyroid Function Tests: No results for input(s): TSH, T4TOTAL, FREET4, T3FREE, THYROIDAB in the last 72  hours. Anemia Panel: No results for input(s): VITAMINB12, FOLATE, FERRITIN, TIBC, IRON, RETICCTPCT in the last 72 hours. Sepsis Labs: Recent Labs  Lab 07/30/20 0256  PROCALCITON <0.10    No results found for this or any previous visit (from the past 240 hour(s)).       Radiology Studies: No results found.      Scheduled Meds: . apixaban  5 mg Oral BID  . atorvastatin  20 mg Oral QHS  . bethanechol  25 mg Oral TID  . Chlorhexidine Gluconate Cloth  6 each Topical Daily  . diltiazem  240 mg Oral Daily  . divalproex  500 mg Oral Q12H  . fluticasone furoate-vilanterol  1 puff Inhalation Daily  . insulin aspart  0-5 Units Subcutaneous QHS  . insulin aspart  0-6 Units Subcutaneous TID WC  . insulin detemir  15 Units Subcutaneous Daily  . loxapine  20 mg Oral QHS  . melatonin  5 mg Oral QHS  . pantoprazole  40 mg Oral Daily  . polyvinyl alcohol  1 drop Both Eyes Q breakfast  . potassium chloride  40 mEq Oral Q4H   Continuous Infusions:    LOS: 35 days   Time spent= 35 mins    Ankit Joline Maxcy, MD [facility]  If 7PM-7AM, please contact night-coverage  08/04/2020, 11:13 AM

## 2020-08-05 LAB — BASIC METABOLIC PANEL
Anion gap: 7 (ref 5–15)
BUN: 11 mg/dL (ref 8–23)
CO2: 39 mmol/L — ABNORMAL HIGH (ref 22–32)
Calcium: 8.6 mg/dL — ABNORMAL LOW (ref 8.9–10.3)
Chloride: 98 mmol/L (ref 98–111)
Creatinine, Ser: 0.31 mg/dL — ABNORMAL LOW (ref 0.44–1.00)
GFR, Estimated: >60 mL/min (ref >60)
Glucose, Bld: 109 mg/dL — ABNORMAL HIGH (ref 70–99)
Potassium: 4.2 mmol/L (ref 3.5–5.1)
Sodium: 144 mmol/L (ref 135–145)

## 2020-08-05 LAB — CBC
HCT: 35.5 % — ABNORMAL LOW (ref 36.0–46.0)
Hemoglobin: 11.5 g/dL — ABNORMAL LOW (ref 12.0–15.0)
MCH: 30.9 pg (ref 26.0–34.0)
MCHC: 32.4 g/dL (ref 30.0–36.0)
MCV: 95.4 fL (ref 80.0–100.0)
Platelets: 177 10*3/uL (ref 150–400)
RBC: 3.72 MIL/uL — ABNORMAL LOW (ref 3.87–5.11)
RDW: 14.2 % (ref 11.5–15.5)
WBC: 4.1 10*3/uL (ref 4.0–10.5)
nRBC: 0 % (ref 0.0–0.2)

## 2020-08-05 LAB — GLUCOSE, CAPILLARY
Glucose-Capillary: 105 mg/dL — ABNORMAL HIGH (ref 70–99)
Glucose-Capillary: 171 mg/dL — ABNORMAL HIGH (ref 70–99)
Glucose-Capillary: 175 mg/dL — ABNORMAL HIGH (ref 70–99)
Glucose-Capillary: 181 mg/dL — ABNORMAL HIGH (ref 70–99)

## 2020-08-05 LAB — MAGNESIUM: Magnesium: 1.5 mg/dL — ABNORMAL LOW (ref 1.7–2.4)

## 2020-08-05 MED ORDER — FUROSEMIDE 10 MG/ML IJ SOLN
INTRAMUSCULAR | Status: AC
  Administered 2020-08-05: 10 mg
  Filled 2020-08-05: qty 2

## 2020-08-05 MED ORDER — FUROSEMIDE 10 MG/ML IJ SOLN
60.0000 mg | Freq: Once | INTRAMUSCULAR | Status: AC
  Administered 2020-08-05: 60 mg via INTRAVENOUS
  Filled 2020-08-05: qty 6

## 2020-08-05 NOTE — Progress Notes (Signed)
Patient ID: Stephanie Rice, female   DOB: February 20, 1956, 65 y.o.   MRN: [id number]  PROGRESS NOTE    Stephanie Rice  HWE:[id number] DOB: 19-Sep-1955 DOA: 06/30/2020 PCP: [name], PA-C   Brief Narrative:  65 year old female with history of dementia, intellectual disability, COPD/chronic hypoxic respite failure on trilogy vent and 2 L oxygen as needed, recent DVT, obesity, diabetes mellitus type 2, bipolar disorder and hypertension was brought to the ED from SNF due to altered mental status and admitted for acute metabolic encephalopathy in the setting of hypercapnia from acute on chronic respiratory failure and COPD exacerbation and UTI.  She was treated with steroids, antibiotics and BiPAP with improvement in ABG but waxing and waning mental status and there was some concerns regarding behavioral issues.  Psychiatry was consulted and gave recommendations.  Hospital course was complicated by acute thrombocytopenia and acute urinary retention.  Hematology was consulted for thrombocytopenia.  Psychiatry also adjusted psych medications which could potentially contribute to thrombocytopenia.  Her platelets have improved.  Oncology is following peripherally as well.  Patient has remained stable over the last several days waiting for insurance approval for SNF.  Daughter has appealed the discharge process, waiting for final decision from insurance.  Assessment & Plan:   Acute metabolic encephalopathy/delirium -multifactorial including hypercapnia, UTI, CAP, underlying dementia, bipolar disorder and intellectual disability.  Reportedly waxing and waning suggesting delirium.  She was also on steroids for COPD exacerbation which could have contributed.   CT head, RPR, B12 and TSH without acute finding -Reorientation and delirium precautions -Minimize sedating meds. -Psychiatry decreased Depakote to 500 mg twice daily in the setting of acute thrombocytopenia.   -CT and MRI brain unremarkable  Acute on  chronic hypoxic and hypercapnic respiratory failure due to COPD exacerbation and CAP -Patient is on trilogy vent and oxygen 2 L as needed at baseline -ABG improved with BiPAP.  Minimal oxygen to keep saturation above 88%.  -Completed 5 days of doxycycline and 3 days of ceftriaxone -Completed course of systemic steroid -Apparently, SNF does not take patients with trilogy vent.  So, she will need BiPAP on discharge.   -Oxygen requirement still fluctuating: Still requiring 4 L oxygen via nasal cannula during daytime.  Wean off as able.   -Has intermittently required IV Lasix during this hospitalization.  Chronic atrial fibrillation with RVR -TTE without significant finding. -Heart rate stable currently.  Continue Cardizem -Continue Eliquis  Hypokalemia -Improved.  Hypomagnesemia -Replace.  Repeat a.m. labs.  Dysphagia: -Dysphagia 2 diet per SLP. -Continue aspiration precautions  UTI -completed antibiotic course with ceftriaxone and Keflex  Hypertension -Blood pressure improving.  Continue Cardizem   IDDM-2 with hyperglycemia and neuropathy: A1c 7.2%. -Continue Levemir along with CBGs with SSI  History of bipolar disorder -Psychiatry decreased Depakote to 500 mg twice daily (home dose) in the setting of thrombocytopenia. -Patient is also on loxapine 20 mg daily -Outpatient follow-up with psychiatry  History of DVT -Continue Eliquis.  Acute urinary retention:  -Renal ultrasound unremarkable. Foley has been removed and per nursing, she has been voiding without any trouble.   Pressure injury of the right buttock stage II: Not present on admission -Continue local wound care  Thrombocytopenia -Resolved.  Hematology signed off.  May follow-up as outpatient with hematology if needed  Obesity -Outpatient follow-up  Generalized conditioning -Palliative care evaluation appreciated: Recommend outpatient palliative care follow-up.  Overall prognosis is poor  DVT  prophylaxis: apixaban   Code Status: DNR/DNI Family Communication: None at bedside.  Disposition Plan:  Status is: Inpatient  Remains inpatient appropriate because:Inpatient level of care appropriate due to severity of illness.  Insurance declined SNF.  Prior hospitalist provided peer to peer with insurance company on 07/13/2020.  Daughter has appealed SNF declination.  TOC is working on Consulting civil engineer disposition with her daughter.   Dispo: The patient is from: SNF              Anticipated d/c is to: SNF versus home health.  Patient is currently medically stable for discharge              Patient currently is medically stable to d/c.   Difficult to place patient Yes  Consultants: Psychiatry/hematology.  Palliative care evaluation is pending  Procedures: None  Antimicrobials:  Anti-infectives (From admission, onward)   Start     Dose/Rate Route Frequency Ordered Stop   07/03/20 1400  cephALEXin (KEFLEX) capsule 500 mg        500 mg Oral Every 8 hours 07/03/20 0954 07/07/20 1146   06/30/20 2200  doxycycline (VIBRA-TABS) tablet 100 mg        100 mg Oral Every 12 hours 06/30/20 1612 07/05/20 2159   06/30/20 2030  cefTRIAXone (ROCEPHIN) 2 g in sodium chloride 0.9 % 100 mL IVPB  Status:  Discontinued        2 g 200 mL/hr over 30 Minutes Intravenous Daily at bedtime 06/30/20 2006 07/03/20 0954   06/30/20 1800  Ampicillin-Sulbactam (UNASYN) 3 g in sodium chloride 0.9 % 100 mL IVPB  Status:  Discontinued        3 g 200 mL/hr over 30 Minutes Intravenous Every 6 hours 06/30/20 1703 06/30/20 1954       Subjective: Patient seen and examined at bedside.  Poor historian.  Wakes up slightly, hardly participates in any conversation.  No overnight fever, vomiting reported.  Objective: Vitals:   08/04/20 1136 08/04/20 2143 08/05/20 0632 08/05/20 0803  BP:  (!) 162/80 (!) 125/102   Pulse:  88 93 85  Resp:  20 20 16   Temp: 98.2 F (36.8 C) 98 F (36.7 C) 97.8 F (36.6 C)   TempSrc: Oral      SpO2:  100% 99% 94%  Weight:      Height:        Intake/Output Summary (Last 24 hours) at 08/05/2020 1059 Last data filed at 08/04/2020 2205 Gross per 24 hour  Intake 120 ml  Output 700 ml  Net -580 ml   Filed Weights   07/07/20 1731  Weight: 104.9 kg    Examination:  General exam:  Extremely deconditioned and ill looking.  No distress.  On 4 L oxygen via nasal cannula currently  respiratory system: Decreased breath sounds at bases with some scattered crackles cardiovascular system: Rate controlled, S1-S2 heard gastrointestinal system: Abdomen is obese, mildly distended, soft and nontender.  Sounds are heard extremities: Bilateral lower extremity edema present; no clubbing right upper extremity edema present as well Central nervous system: Extremely poor historian; wakes up slightly, hardly participates in any conversation.  No focal neurological deficits.  Moving extremities skin: No obvious ecchymosis/lesions  psychiatry: Cannot be assessed because of mental status  Data Reviewed: I have personally reviewed following labs and imaging studies  CBC: Recent Labs  Lab 08/01/20 0149 08/02/20 0113 08/03/20 0256 08/04/20 0044 08/05/20 0244  WBC 6.7 11.2* 8.8 4.5 4.1  HGB 11.4* 13.3 12.1 12.3 11.5*  HCT 34.7* 40.8 38.8 40.0 35.5*  MCV 94.6 93.8 99.0 97.6 95.4  PLT 163 231  154 166 177   Basic Metabolic Panel: Recent Labs  Lab 08/01/20 0149 08/02/20 0113 08/03/20 0256 08/04/20 0044 08/05/20 0244  NA 137 138 136 141 144  K 3.4* 2.8* 4.6 3.2* 4.2  CL 89* 89* 97* 93* 98  CO2 41* 35* 27 41* 39*  GLUCOSE 230* 184* 113* 168* 109*  BUN 18 29* 33* 23 11  CREATININE 0.36* 0.86 0.52 0.43* 0.31*  CALCIUM 8.6* 8.8* 8.0* 8.5* 8.6*  MG 1.6* 1.9 1.8 1.8 1.5*   GFR: Estimated Creatinine Clearance: 82.1 mL/min (A) (by C-G formula based on SCr of 0.31 mg/dL (L)). Liver Function Tests: No results for input(s): AST, ALT, ALKPHOS, BILITOT, PROT, ALBUMIN in the last 168 hours. No  results for input(s): LIPASE, AMYLASE in the last 168 hours. No results for input(s): AMMONIA in the last 168 hours. Coagulation Profile: No results for input(s): INR, PROTIME in the last 168 hours. Cardiac Enzymes: No results for input(s): CKTOTAL, CKMB, CKMBINDEX, TROPONINI in the last 168 hours. BNP (last 3 results) No results for input(s): PROBNP in the last 8760 hours. HbA1C: No results for input(s): HGBA1C in the last 72 hours. CBG: Recent Labs  Lab 08/04/20 0805 08/04/20 1131 08/04/20 1535 08/04/20 2140 08/05/20 0757  GLUCAP 136* 149* 150* 130* 105*   Lipid Profile: No results for input(s): CHOL, HDL, LDLCALC, TRIG, CHOLHDL, LDLDIRECT in the last 72 hours. Thyroid Function Tests: No results for input(s): TSH, T4TOTAL, FREET4, T3FREE, THYROIDAB in the last 72 hours. Anemia Panel: No results for input(s): VITAMINB12, FOLATE, FERRITIN, TIBC, IRON, RETICCTPCT in the last 72 hours. Sepsis Labs: Recent Labs  Lab 07/30/20 0256  PROCALCITON <0.10    No results found for this or any previous visit (from the past 240 hour(s)).       Radiology Studies: No results found.      Scheduled Meds: . apixaban  5 mg Oral BID  . atorvastatin  20 mg Oral QHS  . bethanechol  25 mg Oral TID  . Chlorhexidine Gluconate Cloth  6 each Topical Daily  . diltiazem  240 mg Oral Daily  . divalproex  500 mg Oral Q12H  . fluticasone furoate-vilanterol  1 puff Inhalation Daily  . insulin aspart  0-5 Units Subcutaneous QHS  . insulin aspart  0-6 Units Subcutaneous TID WC  . insulin detemir  15 Units Subcutaneous Daily  . loxapine  20 mg Oral QHS  . melatonin  5 mg Oral QHS  . pantoprazole  40 mg Oral Daily  . polyvinyl alcohol  1 drop Both Eyes Q breakfast   Continuous Infusions:         Glade Lloyd, MD [facility] 08/05/2020, 10:59 AM

## 2020-08-05 NOTE — Progress Notes (Signed)
Palliative Medicine RN Note: Our office rec'd a call from pt's Essie Hart [phone]). She has questions about discharge timing/DME. I have sent a message to Charlann Noss [facility] requesting she call Joyce Gross back.  Margret Chance Henslee, RN, BSN, [facility] Palliative Medicine Team 08/05/2020 11:21 AM Office [phone]

## 2020-08-05 NOTE — Care Plan (Signed)
SLP signed off of 07/09/20 due to their being no further acute SLP needs with recommendation for continued SLP services at discharge. SLP has been contacted by Charlestine Night and advised that the pt's daughter would like to speak with SLP regarding the pt's diet and recommendations. Pt's daughter was contacted on two occasions by this SLP per her request. The most recent contact was today. Pt's daughter was re-educated regarding the pt's diet, swallowing precautions, and options regarding various foods meals. She verbalized understanding and agreement regarding all areas of education, as well as the recommendation for continued SLP services following discharge. Pt's daughter initially indicated that the she needed a prescription for pureed foods so that the pt's insurance would cover this. However, during today's discussion, she stated that she spoke with an insurance rep who indicated that the pt would need a referral for a dietitian "preferrably near [place] or someone who can come to the house" at discharge to help facilitate coverage of pre-pureed foods. Pt's daughter was advised that SLP will provide paperwork on the pt's diet to Tammy Sours to be included in her discharge. Tammy Sours, RN was updated regarding the outcome of conversation with pt's daughter and her requests.

## 2020-08-05 NOTE — Progress Notes (Signed)
Physical Therapy Treatment Patient Details Name: Stephanie Rice MRN: [id number] DOB: [birth year] Today's Date: 08/05/2020    History of Present Illness Stephanie Rice is a 65 y.o. female with medical history significant of chronic hypoxic and hypercapnic respiratory failure secondary COPD, on Trelegy home vent PRN +2 L PRN, HTN, recent diagnosed DVT, morbid obesity, IDDM, Dementia, bipolar disorder, presented with altered mentation. episodes requiring BiPAP; episodes of afib    PT Comments    Patient continues to make progress as she is more alert and cooperative. She actually asked to sit up at EOB! She was able to come to sit EOB with mod assist of 1 person and sit ~20 minutes with minguard assist while engaging in reaching activities. Patient's daughter requested phone call (per LCSW) and updated her on patient's abilities and need for 2 person assist to use the lift that has been delivered to her home. She reports they will have 40 hrs of aids and they can assist her.    Follow Up Recommendations  SNF;Supervision/Assistance - 24 hour     Equipment Recommendations  Other (comment) (hoyer with hoyer pad (noted daughter does not want w/c and insurance won't pay for another hospital bed))    Recommendations for Other Services       Precautions / Restrictions Precautions Precautions: Fall Precaution Comments: monitor O2, delirium    Mobility  Bed Mobility Overal bed mobility: Needs Assistance Bed Mobility: Rolling;Sidelying to Sit;Sit to Sidelying Rolling: Mod assist (with rail) Sidelying to sit: Mod assist;HOB elevated (HOB 30)     Sit to sidelying: Mod assist General bed mobility comments: pt able to tell me she had an accident in the bed and wanted to get cleaned up; rolling for changing linens and then progressed to EOB (all with 1 person assist)    Transfers Overall transfer level: Needs assistance   Transfers: Lateral/Scoot Transfers          Lateral/Scoot  Transfers: Mod assist General transfer comment: 3 lateral scoots to her left toward [facility] using draw pad under hips to assist  Ambulation/Gait             General Gait Details: asking to walk to bathroom but unable to safely stand   Stairs             Wheelchair Mobility    Modified Rankin (Stroke Patients Only)       Balance Overall balance assessment: Needs assistance Sitting-balance support: Feet supported Sitting balance-Leahy Scale: Poor Sitting balance - Comments: majority of time used LUE for support, but able to maintian midline without UE assist for up to 60 sec at a time Postural control: Left lateral lean                                  Cognition Arousal/Alertness: Awake/alert Behavior During Therapy: WFL for tasks assessed/performed Overall Cognitive Status: History of cognitive impairments - at baseline Area of Impairment: Attention;Following commands;Problem solving;Memory;Safety/judgement;Awareness                 Orientation Level:  (NT) Current Attention Level: Sustained Memory: Decreased short-term memory Following Commands: Follows one step commands with increased time Safety/Judgement: Decreased awareness of safety;Decreased awareness of deficits Awareness: Intellectual Problem Solving: Slow processing;Decreased initiation;Difficulty sequencing;Requires verbal cues;Requires tactile cues General Comments: follows one step commands consistently      Exercises      General Comments General comments (skin integrity, edema, etc.): Pt  sat EOB ~20 minutes with min-guard assist. Engaged in reaching to stack medicine cups with LUE while maintaining her balance; reaching for cup of thickened water with LUE; worked on wt-shift to right (with reaching activities) and laterally scooting      Pertinent Vitals/Pain Pain Assessment: No/denies pain Faces Pain Scale: No hurt    Home Living                      Prior  Function            PT Goals (current goals can now be found in the care plan section) Acute Rehab PT Goals Patient Stated Goal: eat lunch PT Goal Formulation: Patient unable to participate in goal setting Time For Goal Achievement: 08/13/20 Progress towards PT goals: Progressing toward goals    Frequency    Min 2X/week      PT Plan Current plan remains appropriate    Co-evaluation              AM-PAC PT "6 Clicks" Mobility   Outcome Measure  Help needed turning from your back to your side while in a flat bed without using bedrails?: A Lot Help needed moving from lying on your back to sitting on the side of a flat bed without using bedrails?: A Lot Help needed moving to and from a bed to a chair (including a wheelchair)?: Total Help needed standing up from a chair using your arms (e.g., wheelchair or bedside chair)?: Total Help needed to walk in hospital room?: Total Help needed climbing 3-5 steps with a railing? : Total 6 Click Score: 8    End of Session Equipment Utilized During Treatment: Oxygen Activity Tolerance: Patient tolerated treatment well Patient left: in bed;with call bell/phone within reach;with nursing/sitter in room (pt on bedpan) Nurse Communication: Need for lift equipment PT Visit Diagnosis: Other abnormalities of gait and mobility (R26.89)     Time: 5379-4327 PT Time Calculation (min) (ACUTE ONLY): 37 min  Charges:  $Therapeutic Activity: 23-37 mins                      Jerolyn Center, PT Pager [phone]    Zena Amos 08/05/2020, 1:19 PM

## 2020-08-05 NOTE — TOC Progression Note (Addendum)
Transition of Care [facility]) - Progression Note    Patient Details  Name: Stephanie Rice MRN: [id number] Date of Birth: 10/29/1955  Transition of Care [facility]) CM/SW Contact  [name], LCSW Phone Number: 08/05/2020, 11:45 AM  Clinical Narrative:    CSW spoke with Velna Hatchet at [employer], hoyer lift will be delivered around lunchtime today.  CSW spoke with Ashely at [facility] to confirm trilogy and home O2 status.  He called back--pt has POC, which will be delivered to hospital for ride home.  Trilogy is in home, still current with authorization.  THey will contact pt daughter regarding home visit to confirm everything is working correctly.   CSW spoke with Lynn/PT.  She is working with pt this AM, will call daughter for update afterwards.  CSW spoke with [facility], speech.  SHe has been signed off since late Feb.  She will drop off literature on pt diet and also will speak with pt daughter to discuss this.   1420: CSW spoke with Joyce Gross.  Lift was delivered.  The [employer] person was not able to tell her a whole lot but she is working on videos about how to use it.  CSW offered to call [employer] back if she needs more training and she will let me know.  She did speak with both speech therapist and physical therapist and received updates.  She would like PTAR transport home tomorrow, which will be arranged.  She plans to be at the hospital around 10am, would like scripts sent to [facility] in [place] on [address].  Discussed Frances Furbish will provide [facility].  CSW spoke with Ben[place] at [facility], who had accepted pt as PT/OT.  Discussed increased services to RN, speech, [facility] aide.  He will contact [place] office and check on this.    Expected Discharge Plan: Skilled Nursing Facility Barriers to Discharge: Continued Medical Work up  Expected Discharge Plan and Services Expected Discharge Plan: Skilled Nursing Facility     Post Acute Care Choice:  (daughter requesting LTAC) Living arrangements for the past 2 months:  Skilled Nursing Facility (was hospitalized at [facility] for 30+ days prior to SNF)                 DME Arranged: Other see comment (hoyer lift)         HH Arranged: PT,OT,Nurse's Aide HH Agency: [facility] Date [facility] Agency Contacted: 07/14/20 Time HH Agency Contacted: 1300 Representative spoke with at [facility] Agency: Kandee Keen   Social Determinants of Health (SDOH) Interventions    Readmission Risk Interventions No flowsheet data found.

## 2020-08-06 LAB — CBC
HCT: 37.2 % (ref 36.0–46.0)
Hemoglobin: 11.7 g/dL — ABNORMAL LOW (ref 12.0–15.0)
MCH: 30.5 pg (ref 26.0–34.0)
MCHC: 31.5 g/dL (ref 30.0–36.0)
MCV: 96.9 fL (ref 80.0–100.0)
Platelets: 164 10*3/uL (ref 150–400)
RBC: 3.84 MIL/uL — ABNORMAL LOW (ref 3.87–5.11)
RDW: 14.4 % (ref 11.5–15.5)
WBC: 4.6 10*3/uL (ref 4.0–10.5)
nRBC: 0 % (ref 0.0–0.2)

## 2020-08-06 LAB — GLUCOSE, CAPILLARY
Glucose-Capillary: 151 mg/dL — ABNORMAL HIGH (ref 70–99)
Glucose-Capillary: 151 mg/dL — ABNORMAL HIGH (ref 70–99)
Glucose-Capillary: 151 mg/dL — ABNORMAL HIGH (ref 70–99)
Glucose-Capillary: 151 mg/dL — ABNORMAL HIGH (ref 70–99)
Glucose-Capillary: 201 mg/dL — ABNORMAL HIGH (ref 70–99)
Glucose-Capillary: 191 mg/dL — ABNORMAL HIGH (ref 70–99)

## 2020-08-06 LAB — BASIC METABOLIC PANEL
Anion gap: 5 (ref 5–15)
BUN: 12 mg/dL (ref 8–23)
CO2: 44 mmol/L — ABNORMAL HIGH (ref 22–32)
Calcium: 8.3 mg/dL — ABNORMAL LOW (ref 8.9–10.3)
Chloride: 92 mmol/L — ABNORMAL LOW (ref 98–111)
Creatinine, Ser: 0.33 mg/dL — ABNORMAL LOW (ref 0.44–1.00)
GFR, Estimated: >60 mL/min (ref >60)
Glucose, Bld: 205 mg/dL — ABNORMAL HIGH (ref 70–99)
Potassium: 3.5 mmol/L (ref 3.5–5.1)
Sodium: 141 mmol/L (ref 135–145)

## 2020-08-06 LAB — MAGNESIUM: Magnesium: 1.4 mg/dL — ABNORMAL LOW (ref 1.7–2.4)

## 2020-08-06 MED ORDER — MAGNESIUM SULFATE 2 GM/50ML IV SOLN
2.0000 g | Freq: Once | INTRAVENOUS | Status: AC
  Administered 2020-08-06: 2 g via INTRAVENOUS
  Filled 2020-08-06: qty 50

## 2020-08-06 MED ORDER — DIVALPROEX SODIUM 125 MG PO CSDR: 500.0000 mg | DELAYED_RELEASE_CAPSULE | Freq: Two times a day (BID) | ORAL | 0 refills | Status: AC

## 2020-08-06 MED ORDER — LOXAPINE SUCCINATE 10 MG PO CAPS: 20.0000 mg | ORAL_CAPSULE | Freq: Every day | ORAL | Status: AC

## 2020-08-06 MED ORDER — BREO ELLIPTA 100-25 MCG/INH IN AEPB: 1.0000 | INHALATION_SPRAY | Freq: Every day | RESPIRATORY_TRACT | 0 refills | Status: AC

## 2020-08-06 MED ORDER — INSULIN DETEMIR 100 UNIT/ML ~~LOC~~ SOLN: 15.0000 [IU] | Freq: Every day | SUBCUTANEOUS | Status: AC

## 2020-08-06 MED ORDER — DM-GUAIFENESIN ER 30-600 MG PO TB12: 1.0000 | ORAL_TABLET | Freq: Two times a day (BID) | ORAL | 0 refills | Status: AC | PRN

## 2020-08-06 MED ORDER — DILTIAZEM HCL ER COATED BEADS 240 MG PO CP24
240.0000 mg | ORAL_CAPSULE | Freq: Every day | ORAL | 0 refills | Status: AC
Start: 2020-08-06 — End: ?

## 2020-08-06 MED ORDER — FUROSEMIDE 10 MG/ML IJ SOLN
60.0000 mg | Freq: Once | INTRAMUSCULAR | Status: AC
  Administered 2020-08-06: 60 mg via INTRAVENOUS
  Filled 2020-08-06: qty 6

## 2020-08-06 MED ORDER — TRAMADOL HCL 50 MG PO TABS: 50.0000 mg | ORAL_TABLET | Freq: Four times a day (QID) | ORAL | 0 refills | Status: AC | PRN

## 2020-08-06 MED ORDER — CLONAZEPAM 0.25 MG PO TBDP: 0.2500 mg | ORAL_TABLET | Freq: Two times a day (BID) | ORAL | 0 refills | Status: AC | PRN

## 2020-08-06 MED ORDER — MELATONIN 5 MG PO TABS: 5.0000 mg | ORAL_TABLET | Freq: Every day | ORAL | 0 refills | Status: AC

## 2020-08-06 MED ORDER — FUROSEMIDE 40 MG PO TABS: 40.0000 mg | ORAL_TABLET | Freq: Every day | ORAL | 0 refills | Status: AC

## 2020-08-06 MED ORDER — BETHANECHOL CHLORIDE 25 MG PO TABS
25.0000 mg | ORAL_TABLET | Freq: Three times a day (TID) | ORAL | 0 refills | Status: AC
Start: 2020-08-06 — End: ?

## 2020-08-06 NOTE — Progress Notes (Signed)
[facility] 2W13 [facility]) Hospital Liaison RN Note  This patient has been referred to [facility] for outpatient based palliative care after discharge.  We will follow for discharge disposition.  Please call with any outpatient based palliative care questions.  Thank you. Haynes Bast, BSN, RN [facility] Liaison [phone]

## 2020-08-06 NOTE — Progress Notes (Signed)
Pt discharged home with medic. Packet sent with patient.

## 2020-08-06 NOTE — Discharge Summary (Signed)
Physician Discharge Summary  Stephanie Rice XIP:[id number] DOB: [birth year] DOA: 06/30/2020  PCP: [name], PA-C  Admit date: 06/30/2020 Discharge date: 08/06/2020  Admitted From: Home Disposition: Home  Recommendations for Outpatient Follow-up:  1. Follow up with PCP in 1 week with repeat CBC/BMP 2. Recommend outpatient evaluation and follow-up by pulmonary/cardiology/neurology/psychiatry and palliative care 3. Consider total comfort measures/hospice if condition worsens. 4. Follow up in ED if symptoms worsen or new appear   Home Health: PT/OT/RN Equipment/Devices: Continue home trilogy at night and continue supplemental oxygen during the day  Discharge Condition: Guarded to poor  CODE STATUS: DNR  diet recommendation: Heart healthy/fluid restriction of up to 1500 cc a day  Brief/Interim Summary: 65 year old female with history of dementia, intellectual disability, COPD/chronic hypoxic respite failure on trilogy vent and 2 L oxygen as needed, recent DVT, obesity, diabetes mellitus type 2, bipolar disorder and hypertension was brought to the ED from SNF due to altered mental status and admitted for acute metabolic encephalopathy in the setting of hypercapnia from acute on chronic respiratory failure and COPD exacerbation and UTI.  She was treated with steroids, antibiotics and BiPAP with improvement in ABG but waxing and waning mental status and there was some concerns regarding behavioral issues.  Psychiatry was consulted and gave recommendations.  Hospital course was complicated by acute thrombocytopenia and acute urinary retention.  Hematology was consulted for thrombocytopenia.  Psychiatry also adjusted psych medications which could potentially contribute to thrombocytopenia.  Her platelets have improved.  Oncology is following peripherally as well.    Patient has remained stable over the last several days waiting for insurance approval for SNF.  Daughter has appealed the  discharge process, waiting for final decision from insurance. Subsequently, daughter has decided to take her mother home with home health.  She will be discharged home today.  Overall prognosis is very poor.   Discharge Diagnoses:   Acute metabolic encephalopathy/delirium -multifactorial including hypercapnia, UTI, CAP, underlying dementia, bipolar disorder and intellectual disability. Reportedly waxing and waning suggesting delirium. She was also on steroids for COPD exacerbation which could have contributed. CT head, RPR, B12 and TSH without acute finding -Reorientation and delirium precautions -Minimize sedating meds. -Psychiatry decreased Depakote to 500 mg twice daily in the setting of acute thrombocytopenia.  -CT and MRI brain unremarkable -Outpatient follow-up with neurology/psychiatry. -Discharge patient home today.  Acute on chronic hypoxic and hypercapnic respiratory failure due to COPD exacerbation and CAP -Patient is on trilogy vent and oxygen 2 L as needed at baseline -ABG improved with BiPAP. Minimal oxygen to keep saturation above 88%.  -Completed 5 days of doxycycline and 3 days of ceftriaxone -Completed course of systemic steroid -Oxygen requirement still fluctuating: Still requiring 4-5 L oxygen via nasal cannula during daytime.   -Has intermittently required IV Lasix during this hospitalization.  Will give 1 more dose of IV Lasix this morning.  She will be discharged on Lasix 40 mg p.o. daily. -Outpatient follow-up with pulmonary. -Continue trilogy vent at night and supplemental oxygen during the day upon discharge.  Chronic atrial fibrillation with RVR -TTE without significant finding. -Heart rate stable currently.  Continue Cardizem -Continue Eliquis  Hypokalemia -Improved.  Hypomagnesemia -Replace prior to discharge.  Dysphagia: -Dysphagia 2 diet per SLP. -Continue aspiration precautions  UTI -completed antibiotic course with ceftriaxone  and Keflex  Hypertension -Blood pressure improving.  Continue Cardizem   IDDM-2 with hyperglycemia and neuropathy:A1c 7.2%. -Continue Levemir.  Carb modified diet.  History of bipolar disorder -Psychiatry decreased Depakote to  500 mg twice daily (home dose) in the setting of thrombocytopenia. -Patient is also on loxapine 20 mg daily -Outpatient follow-up with psychiatry  History of DVT -Continue Eliquis.  Acute urinary retention:  -Renal ultrasound unremarkable. Foley has been removed and per nursing, she has been voiding without any trouble.   Pressure injury of the right buttock stage II: Not present on admission -Continue local wound care  Thrombocytopenia -Resolved. Hematology signed off. May follow-up as outpatient with hematology if needed  Obesity -Outpatient follow-up  Generalized conditioning -Palliative care evaluation appreciated: Recommend outpatient palliative care follow-up.  Overall prognosis is poor  Discharge Instructions  Discharge Instructions    Amb Referral to DSME/T   Complete by: As directed    She required dysphagia 2 diet, chopped food.   Choose type of training services and number of hours requested: Initial DSME/T:  10 hours   Check all special needs that apply to patient requiring 1 on 1 DSME/T: Other - enter comments in box on right Comment - Dietary   DSME/T Content: Nutritional management   Amb Referral to Palliative Care   Complete by: As directed    Goals of care   Ambulatory referral to Cardiology   Complete by: As directed    Hospital followup   Ambulatory referral to Neurology   Complete by: As directed    An appointment is requested in approximately: 1-2 weeks. Hospital followup for encephalopathy   Ambulatory referral to Psychiatry   Complete by: As directed    Ambulatory referral to Pulmonology   Complete by: As directed    Hospital followup   Reason for referral: Asthma/COPD   Diet - low sodium heart healthy    Complete by: As directed    Diet as per SLP recommendations   Increase activity slowly   Complete by: As directed    No wound care   Complete by: As directed      Allergies as of 08/06/2020      Reactions   Ciprofloxacin Hives, Itching, Rash   Nitrofurantoin Hives, Itching, Rash   Dextromethorphan-quinidine Other (See Comments)   Ziprasidone Hcl Other (See Comments)      Medication List    STOP taking these medications   Carboxymethylcellulose Sodium 1 % Gel   Centravites 50 Plus Tabs   cloNIDine 0.3 mg/24hr patch Commonly known as: CATAPRES - Dosed in mg/24 hr   cyclobenzaprine 5 MG tablet Commonly known as: FLEXERIL   gabapentin 300 MG capsule Commonly known as: NEURONTIN   hydrocortisone 1 % ointment   insulin glargine 100 UNIT/ML injection Commonly known as: LANTUS   insulin lispro 100 UNIT/ML injection Commonly known as: HUMALOG   lidocaine 5 % ointment Commonly known as: XYLOCAINE   meloxicam 15 MG tablet Commonly known as: MOBIC   metoCLOPramide 10 MG tablet Commonly known as: REGLAN   montelukast 10 MG tablet Commonly known as: SINGULAIR   Namzaric 28-10 MG Cp24 Generic drug: Memantine HCl-Donepezil HCl   niacin 500 MG CR tablet Commonly known as: NIASPAN   polyvinyl alcohol 1.4 % ophthalmic solution Commonly known as: LIQUIFILM TEARS   primidone 50 MG tablet Commonly known as: MYSOLINE   sertraline 100 MG tablet Commonly known as: ZOLOFT   SSD 1 % cream Generic drug: silver sulfADIAZINE   topiramate 100 MG tablet Commonly known as: TOPAMAX   Valproate Sodium 250 MG/5ML Soln solution Commonly known as: DEPAKENE     TAKE these medications   albuterol (2.5 MG/3ML) 0.083% nebulizer solution Commonly  known as: PROVENTIL Take 2.5 mg by nebulization every 4 (four) hours as needed for wheezing or shortness of breath.   albuterol 108 (90 Base) MCG/ACT inhaler Commonly known as: VENTOLIN HFA Inhale 2 puffs into the lungs every 6 (six)  hours as needed for wheezing or shortness of breath.   bethanechol 25 MG tablet Commonly known as: URECHOLINE Take 1 tablet (25 mg total) by mouth 3 (three) times daily.   Breo Ellipta 100-25 MCG/INH Aepb Generic drug: fluticasone furoate-vilanterol Inhale 1 puff into the lungs daily.   clonazePAM 0.25 MG disintegrating tablet Commonly known as: KLONOPIN Take 1 tablet (0.25 mg total) by mouth 2 (two) times daily as needed (anxiety).   dextromethorphan-guaiFENesin 30-600 MG 12hr tablet Commonly known as: MUCINEX DM Take 1 tablet by mouth 2 (two) times daily as needed for cough.   diltiazem 240 MG 24 hr capsule Commonly known as: CARDIZEM CD Take 1 capsule (240 mg total) by mouth daily.   divalproex 125 MG capsule Commonly known as: DEPAKOTE SPRINKLE Take 4 capsules (500 mg total) by mouth every 12 (twelve) hours.   Eliquis 5 MG Tabs tablet Generic drug: apixaban Take 5 mg by mouth every 12 (twelve) hours.   furosemide 40 MG tablet Commonly known as: Lasix Take 1 tablet (40 mg total) by mouth daily.   insulin detemir 100 UNIT/ML injection Commonly known as: LEVEMIR Inject 0.15 mLs (15 Units total) into the skin daily. What changed: how much to take   loxapine 10 MG capsule Commonly known as: LOXITANE Take 2 capsules (20 mg total) by mouth at bedtime. What changed: how much to take   magnesium oxide 400 MG tablet Commonly known as: MAG-OX Take 400 mg by mouth 3 (three) times daily.   melatonin 5 MG Tabs Take 1 tablet (5 mg total) by mouth at bedtime. What changed: how much to take   omeprazole 40 MG capsule Commonly known as: PRILOSEC Take 40 mg by mouth 2 (two) times daily.   OXYGEN Inhale 2 L into the lungs continuous.   traMADol 50 MG tablet Commonly known as: ULTRAM Take 1 tablet (50 mg total) by mouth every 6 (six) hours as needed for moderate pain.   Vitamin D (Ergocalciferol) 1.25 MG (50000 UNIT) Caps capsule Commonly known as: DRISDOL Take 50,000  Units by mouth once a week.            Durable Medical Equipment  (From admission, onward)         Start     Ordered   07/15/20 1040  For home use only DME Other see comment  Once       Comments: Michiel Sites lift -Patient needs lift for help with transfer between bed and wheelchair. Without lift patient would be confined to the bed.  Question:  Length of Need  Answer:  Lifetime   07/15/20 1041   07/14/20 1029  For home use only DME lightweight manual wheelchair with seat cushion  Once       Comments: Patient suffers from weakness which impairs their ability to perform daily activities like ADLs in the home.  A walking aid will not resolve  issue with performing activities of daily living. A wheelchair will allow patient to safely perform daily activities. Patient is not able to propel themselves in the home using a standard weight wheelchair due to weakness. Patient can self propel in the lightweight wheelchair. Length of need lifetime. Accessories: elevating leg rests (ELRs), wheel locks, extensions and anti-tippers.  07/14/20 1030   07/14/20 1002  For home use only DME Hospital bed  Once       Question Answer Comment  Length of Need Lifetime   The above medical condition requires: Patient requires the ability to reposition frequently   Head must be elevated greater than: 45 degrees   Bed type Semi-electric   Hoyer Lift Yes      07/14/20 1001          Contact information for after-discharge care    Destination    [facility] .   Service: Skilled Nursing Contact information: [address] [place] [address] [phone]                 Allergies  Allergen Reactions  . Ciprofloxacin Hives, Itching and Rash  . Nitrofurantoin Hives, Itching and Rash  . Dextromethorphan-Quinidine Other (See Comments)  . Ziprasidone Hcl Other (See Comments)    Consultations:  Psychiatry/hematology/palliative  care   Procedures/Studies: MR BRAIN WO CONTRAST  Result Date: 07/15/2020 CLINICAL DATA:  Transient ischemic attack. Additional history provided: History of dementia, intellectual disability, bipolar disorder, hypertension presenting with altered mental status and admitted for acute metabolic encephalopathy in the setting of hypercapnia from acute on chronic respiratory failure and COPD exacerbation and urinary tract infection. EXAM: MRI HEAD WITHOUT CONTRAST TECHNIQUE: Multiplanar, multiecho pulse sequences of the brain and surrounding structures were obtained without intravenous contrast. COMPARISON:  Prior head CT examinations 06/30/2020 and earlier. FINDINGS: Brain: The examination is intermittently motion degraded, limiting evaluation. Most notably, there is moderate/severe motion degradation of the axial T2* sequence and moderate motion degradation of the axial T2 weighted sequence. Mild cerebral and cerebellar atrophy. Commensurate prominence of the ventricles and sulci. Moderate multifocal T2/FLAIR hyperintensity within the cerebral white matter is nonspecific, but compatible with chronic small vessel ischemic disease. There is no acute infarct. No evidence of intracranial mass. No chronic intracranial blood products are identified. No extra-axial fluid collection. No midline shift. Vascular: Expected proximal arterial flow voids. Skull and upper cervical spine: No focal marrow lesion. Sinuses/Orbits: Visualized orbits show no acute finding. No significant paranasal sinus disease. Other: Large bilateral middle ear/mastoid effusions. IMPRESSION: Motion degraded examination, as described and limiting evaluation. The axial diffusion-weighted imaging is of good quality. No evidence of acute infarct. No evidence of acute intracranial abnormality. Moderate cerebral white matter chronic small vessel ischemic disease. Mild generalized parenchymal atrophy. Large bilateral middle ear/mastoid effusions.  Electronically Signed   By: Jackey Loge DO   On: 07/15/2020 14:22   US RENAL  Result Date: 07/11/2020 CLINICAL DATA:  Acute urinary retention. EXAM: RENAL / URINARY TRACT ULTRASOUND COMPLETE COMPARISON:  February 08, 2016. FINDINGS: Right Kidney: Renal measurements: 10.3 x 5.7 x 4.8 cm = volume: 146 mL. Echogenicity within normal limits. No mass or hydronephrosis visualized. Left Kidney: Renal measurements: 11.4 x 6.3 x 6.2 cm = volume: 235 mL. Echogenicity within normal limits. No mass or hydronephrosis visualized. Bladder: Not visualized due to persistent patient motion. Other: None. IMPRESSION: Normal renal ultrasound. Electronically Signed   By: Lupita Raider M.D.   On: 07/11/2020 17:45   DG Chest Port 1 View  Result Date: 08/02/2020 CLINICAL DATA:  Dyspnea EXAM: PORTABLE CHEST 1 VIEW COMPARISON:  07/23/2020 FINDINGS: Increased airspace opacity in the left lower lobe. Stable bandlike density at the right lung base. Stable elevation of the right hemidiaphragm. Borderline enlargement of the cardiopericardial silhouette, without edema. Atherosclerotic calcification of the aortic arch. Thoracic  and upper lumbar spondylosis. IMPRESSION: 1. Increased airspace opacity in the left lower lobe, suspicious for pneumonia or aspiration pneumonitis. 2. Stable bandlike density at the right lung base with stable elevation of the right hemidiaphragm. 3.  Aortic Atherosclerosis (ICD10-I70.0). Electronically Signed   By: Gaylyn Rong M.D.   On: 08/02/2020 14:05   DG CHEST PORT 1 VIEW  Result Date: 07/23/2020 CLINICAL DATA:  Shortness of breath. EXAM: PORTABLE CHEST 1 VIEW COMPARISON:  Chest x-ray 07/16/2020, 06/30/2020. FINDINGS: Mediastinum and hilar structures normal. Stable cardiomegaly. No pulmonary venous congestion. Low lung volumes with bibasilar atelectasis/infiltrates again noted without interim change. Small left pleural effusion again noted. No pneumothorax. Degenerative change thoracic spine.  Degenerative changes both shoulders. Posttraumatic deformity noted of the right proximal humerus. IMPRESSION: 1. Stable cardiomegaly.  No pulmonary venous congestion. 2. Low lung volumes with bibasilar atelectasis/infiltrates again noted without interim change. Small left pleural effusion again noted. Electronically Signed   By: Maisie Fus  Register   On: 07/23/2020 08:40   DG CHEST PORT 1 VIEW  Result Date: 07/16/2020 CLINICAL DATA:  Altered mental status. EXAM: PORTABLE CHEST 1 VIEW COMPARISON:  06/30/2020. FINDINGS: Patient rotated to the left. Stable cardiomegaly. Low lung volumes. Persistent but improved right base atelectasis/infiltrate. Mild left base atelectasis and infiltrate cannot be excluded. Small left pleural effusion, improved from prior exam. No pneumothorax. Degenerative change thoracic spine. Degenerative changes right shoulder. IMPRESSION: 1.  Stable cardiomegaly. 2. Low lung volumes. Persistent but improved right base atelectasis/infiltrate. Mild left base atelectasis and infiltrate cannot be excluded. Small left pleural effusion, improved from prior exam. Electronically Signed   By: Maisie Fus  Register   On: 07/16/2020 13:03   VAS [place] UPPER EXTREMITY VENOUS DUPLEX  Result Date: 07/25/2020 UPPER VENOUS STUDY  Indications: Swelling Risk Factors: None identified. Comparison Study: No previous Performing Technologist: Clint Guy RVT  Examination Guidelines: A complete evaluation includes B-mode imaging, spectral Doppler, color Doppler, and power Doppler as needed of all accessible portions of each vessel. Bilateral testing is considered an integral part of a complete examination. Limited examinations for reoccurring indications may be performed as noted.  Right Findings: +----------+------------+---------+-----------+----------+-------+ RIGHT     CompressiblePhasicitySpontaneousPropertiesSummary +----------+------------+---------+-----------+----------+-------+ IJV           Full       Yes        Yes                      +----------+------------+---------+-----------+----------+-------+ Subclavian    Full       Yes       Yes                      +----------+------------+---------+-----------+----------+-------+ Axillary      Full       Yes       Yes                      +----------+------------+---------+-----------+----------+-------+ Brachial      Full       Yes       Yes                      +----------+------------+---------+-----------+----------+-------+ Radial        Full                                          +----------+------------+---------+-----------+----------+-------+  Ulnar         Full                                          +----------+------------+---------+-----------+----------+-------+ Cephalic      Full                                          +----------+------------+---------+-----------+----------+-------+ Basilic       Full                                          +----------+------------+---------+-----------+----------+-------+  Summary:  Right: No evidence of deep vein thrombosis in the upper extremity. No evidence of superficial vein thrombosis in the upper extremity.  *See table(s) above for measurements and observations.  Diagnosing physician: Coral Else MD Electronically signed by Coral Else MD on 07/25/2020 at 4:19:43 PM.    Final        Subjective: Patient seen and examined at bedside.  Poor historian.  Awake, hardly participates in any conversation.  No overnight fever or vomiting reported.  Discharge Exam: Vitals:   08/05/20 2109 08/06/20 0603  BP: (!) 155/71 (!) 150/66  Pulse: 90 95  Resp: 16 14  Temp: 97.6 F (36.4 C) 97.9 F (36.6 C)  SpO2: 100% 100%    General: Pt is chronically ill looking, elderly female lying in bed.  Very poor historian.  Currently on 5 L oxygen via nasal cannula.  Hardly participates in any conversation.   Cardiovascular: rate controlled, S1/S2  + Respiratory: bilateral decreased breath sounds at bases with scattered crackles Abdominal: Soft, NT, distended, bowel sounds + Extremities: Bilateral lower extremity edema present; no cyanosis    The results of significant diagnostics from this hospitalization (including imaging, microbiology, ancillary and laboratory) are listed below for reference.     Microbiology: No results found for this or any previous visit (from the past 240 hour(s)).   Labs: BNP (last 3 results) Recent Labs    07/30/20 0256 08/02/20 0113  BNP 42.8 42.8   Basic Metabolic Panel: Recent Labs  Lab 08/02/20 0113 08/03/20 0256 08/04/20 0044 08/05/20 0244 08/06/20 0247  NA 138 136 141 144 141  K 2.8* 4.6 3.2* 4.2 3.5  CL 89* 97* 93* 98 92*  CO2 35* 27 41* 39* 44*  GLUCOSE 184* 113* 168* 109* 205*  BUN 29* 33* CREATININE 0.86 0.52 0.43* 0.31* 0.33*  CALCIUM 8.8* 8.0* 8.5* 8.6* 8.3*  MG 1.9 1.8 1.8 1.5* 1.4*   Liver Function Tests: No results for input(s): AST, ALT, ALKPHOS, BILITOT, PROT, ALBUMIN in the last 168 hours. No results for input(s): LIPASE, AMYLASE in the last 168 hours. No results for input(s): AMMONIA in the last 168 hours. CBC: Recent Labs  Lab 08/02/20 0113 08/03/20 0256 08/04/20 0044 08/05/20 0244 08/06/20 0247  WBC 11.2* 8.8 4.5 4.1 4.6  HGB 13.3 12.1 12.3 11.5* 11.7*  HCT 40.8 38.8 40.0 35.5* 37.2  MCV 93.8 99.0 97.6 95.4 96.9  PLT 231 154 166 177 164   Cardiac Enzymes: No results for input(s): CKTOTAL, CKMB, CKMBINDEX, TROPONINI in the last 168 hours. BNP: Invalid input(s): POCBNP CBG: Recent Labs  Lab 08/04/20  2140 08/05/20 0757 08/05/20 1208 08/05/20 1539 08/05/20 2137  GLUCAP 130* 105* 171* 175* 181*   D-Dimer No results for input(s): DDIMER in the last 72 hours. Hgb A1c No results for input(s): HGBA1C in the last 72 hours. Lipid Profile No results for input(s): CHOL, HDL, LDLCALC, TRIG, CHOLHDL, LDLDIRECT in the last 72 hours. Thyroid  function studies No results for input(s): TSH, T4TOTAL, T3FREE, THYROIDAB in the last 72 hours.  Invalid input(s): FREET3 Anemia work up No results for input(s): VITAMINB12, FOLATE, FERRITIN, TIBC, IRON, RETICCTPCT in the last 72 hours. Urinalysis    Component Value Date/Time   COLORURINE YELLOW 06/30/2020 1211   APPEARANCEUR TURBID (A) 06/30/2020 1211   LABSPEC 1.023 06/30/2020 1211   PHURINE 5.0 06/30/2020 1211   GLUCOSEU NEGATIVE 06/30/2020 1211   HGBUR NEGATIVE 06/30/2020 1211   BILIRUBINUR NEGATIVE 06/30/2020 1211   KETONESUR 5 (A) 06/30/2020 1211   PROTEINUR 100 (A) 06/30/2020 1211   NITRITE POSITIVE (A) 06/30/2020 1211   LEUKOCYTESUR LARGE (A) 06/30/2020 1211   Sepsis Labs Invalid input(s): PROCALCITONIN,  WBC,  LACTICIDVEN Microbiology No results found for this or any previous visit (from the past 240 hour(s)).   Time coordinating discharge: 35 minutes  SIGNED:   Glade Lloyd, MD  [facility] 08/06/2020, 8:38 AM

## 2020-08-06 NOTE — TOC Transition Note (Signed)
Transition of Care [facility]) - CM/SW Discharge Note   Patient Details  Name: Stephanie Rice MRN: [id number] Date of Birth: 30-Dec-1955  [facility]) CM/SW Contact:  [name], LCSW Phone Number: 08/06/2020, 2:19 PM   Clinical Narrative:   Pt discharging home with [facility].  Please see previous notes for additional services.  Hoyer lift from [employer].  [employer] providing trilogy and home O2.  Pt transporting home with [facility]. Daughter Joyce Gross here at the hospital and aware.    Final next level of care: Home w Home Health Services Barriers to Discharge: Barriers Resolved   Patient Goals and CMS Choice        Discharge Placement                Patient to be transferred to facility by: [facility] Name of family member notified: Joyce Gross, daughter Patient and family notified of of transfer: 08/06/20  Discharge Plan and Services     Post Acute Care Choice:  (daughter requesting LTAC)          DME Arranged: Other see comment (hoyer lift)         HH Arranged: RN HH Agency: [facility] Date [facility] Agency Contacted: 08/06/20 Time HH Agency Contacted: 1400 Representative spoke with at [facility] Agency: Kandee Keen  Social Determinants of Health (SDOH) Interventions     Readmission Risk Interventions Readmission Risk Prevention Plan 08/06/2020  Transportation Screening Complete  PCP or Specialist Appt within 3-5 Days Not Complete  Not Complete comments already had appt 4/18, this is first available  HRI or Home Care Consult Complete  Social Work Consult for Recovery Care Planning/Counseling Complete  Palliative Care Screening Complete  Some recent data might be hidden

## 2020-08-06 NOTE — TOC Progression Note (Addendum)
Transition of Care [facility]) - Progression Note    Patient Details  Name: Stephanie Rice MRN: [id number] Date of Birth: 06-Sep-1955  Transition of Care [facility]) CM/SW Contact  Stephanie Chars, LCSW Phone Number: 08/06/2020, 1:18 PM  Clinical Narrative:    Stephanie Rice from [place] reports Stephanie Rice office cannot staff speech/RN needs.  CSW texted back and forth about options.  CSW reached out to [facility], [facility]--none of them can staff this case in [place].  CSW spoke with [facility].  Pt has existing PCP appt for 4/18.  This is the earliest available appt with Stephanie Rice.  1030: CSW met with daughter Stephanie Rice in pt room. CSW provided written material on dysphasia 2 diet from speech therapist.  Informed her that [facility] may not be able to provide services.  Discussed options: Stephanie Rice would be open to outpt speech services.  Pt does have medicaid transportation to appts.  Stephanie Rice also has 2 wheelchairs in the home already.    1200: CSW spoke with Stephanie Rice/[facility].  THey may be able provide RN or speech but not both.  He will contact his office.  1300:  CSW spoke with Stephanie Rice at [address] [facility] diabetes and nutrition.  [phone].  Referral faxed to [phone].  1400: Stephanie Rice/[facility] can provide PT/OT/RN services.  CSW spoke with pt daughter Stephanie Rice and conveyed this.  Stephanie Rice is agreeable to this.  SHe has aide services in place through [place] and she will follow up with PCP regarding speech.  Confirmed with Stephanie Rice.  CSW went through all services with daughter in room and she agreed that everything is I place.  RN will also review during discharge.       Expected Discharge Plan: Skilled Nursing Facility Barriers to Discharge: Continued Medical Work up  Expected Discharge Plan and Services Expected Discharge Plan: [place] Acute Care Choice:  (daughter requesting LTAC) Living arrangements for the past 2 months: [place] (was hospitalized at [facility] for 30+  days prior to SNF) Expected Discharge Date: 08/06/20               DME Arranged: Other see comment (hoyer lift)         HH Arranged: PT,OT,Nurse's Aide [place]: [place] Date [place]: 07/14/20 Time HH Agency Contacted: 1300 Representative spoke with at [place]: Stephanie Rice ([place]) Interventions    Readmission Risk Interventions Readmission Risk Prevention Plan 08/06/2020  Transportation Screening Complete  PCP or Specialist Appt within 3-5 Days Not Complete  Not Complete comments already had appt 4/18, this is first available  [place] or [place] Complete  Social Work Consult for [place] Planning/Counseling Complete  Palliative Care Screening Complete  Some recent data might be hidden

## 2020-08-10 ENCOUNTER — Telehealth: Payer: Self-pay

## 2020-08-10 NOTE — Telephone Encounter (Signed)
Spoke with patient's daughter Purnell Shoemaker and scheduled an in-person Palliative Consult for 08/26/20 @ 9:30AM.  COVID screening was negative. A dog in the home but will put away. Patient lives with daughter Purnell Shoemaker.  Consent obtained; updated Outlook/Netsmart/Team List and Epic.  Family is aware they may be receiving a call from NP the day before or day of to confirm appointment.

## 2020-08-26 ENCOUNTER — Other Ambulatory Visit: Payer: Self-pay

## 2020-08-26 ENCOUNTER — Other Ambulatory Visit: Payer: Medicare Other | Admitting: Nurse Practitioner

## 2020-11-13 DEATH — deceased

## 2021-07-01 ENCOUNTER — Telehealth: Payer: Self-pay

## 2021-07-01 NOTE — Telephone Encounter (Signed)
Spoke with daughter. Patient died last year.  Offered support and counseling.

## 2022-09-21 IMAGING — DX DG CHEST 1V
1 series · 1 of 1 positions shown · non-contrast
Comparison: 06/13/2016

CLINICAL DATA: Altered level of consciousness, hypotension

EXAM:
CHEST  1 VIEW

[chest ap]
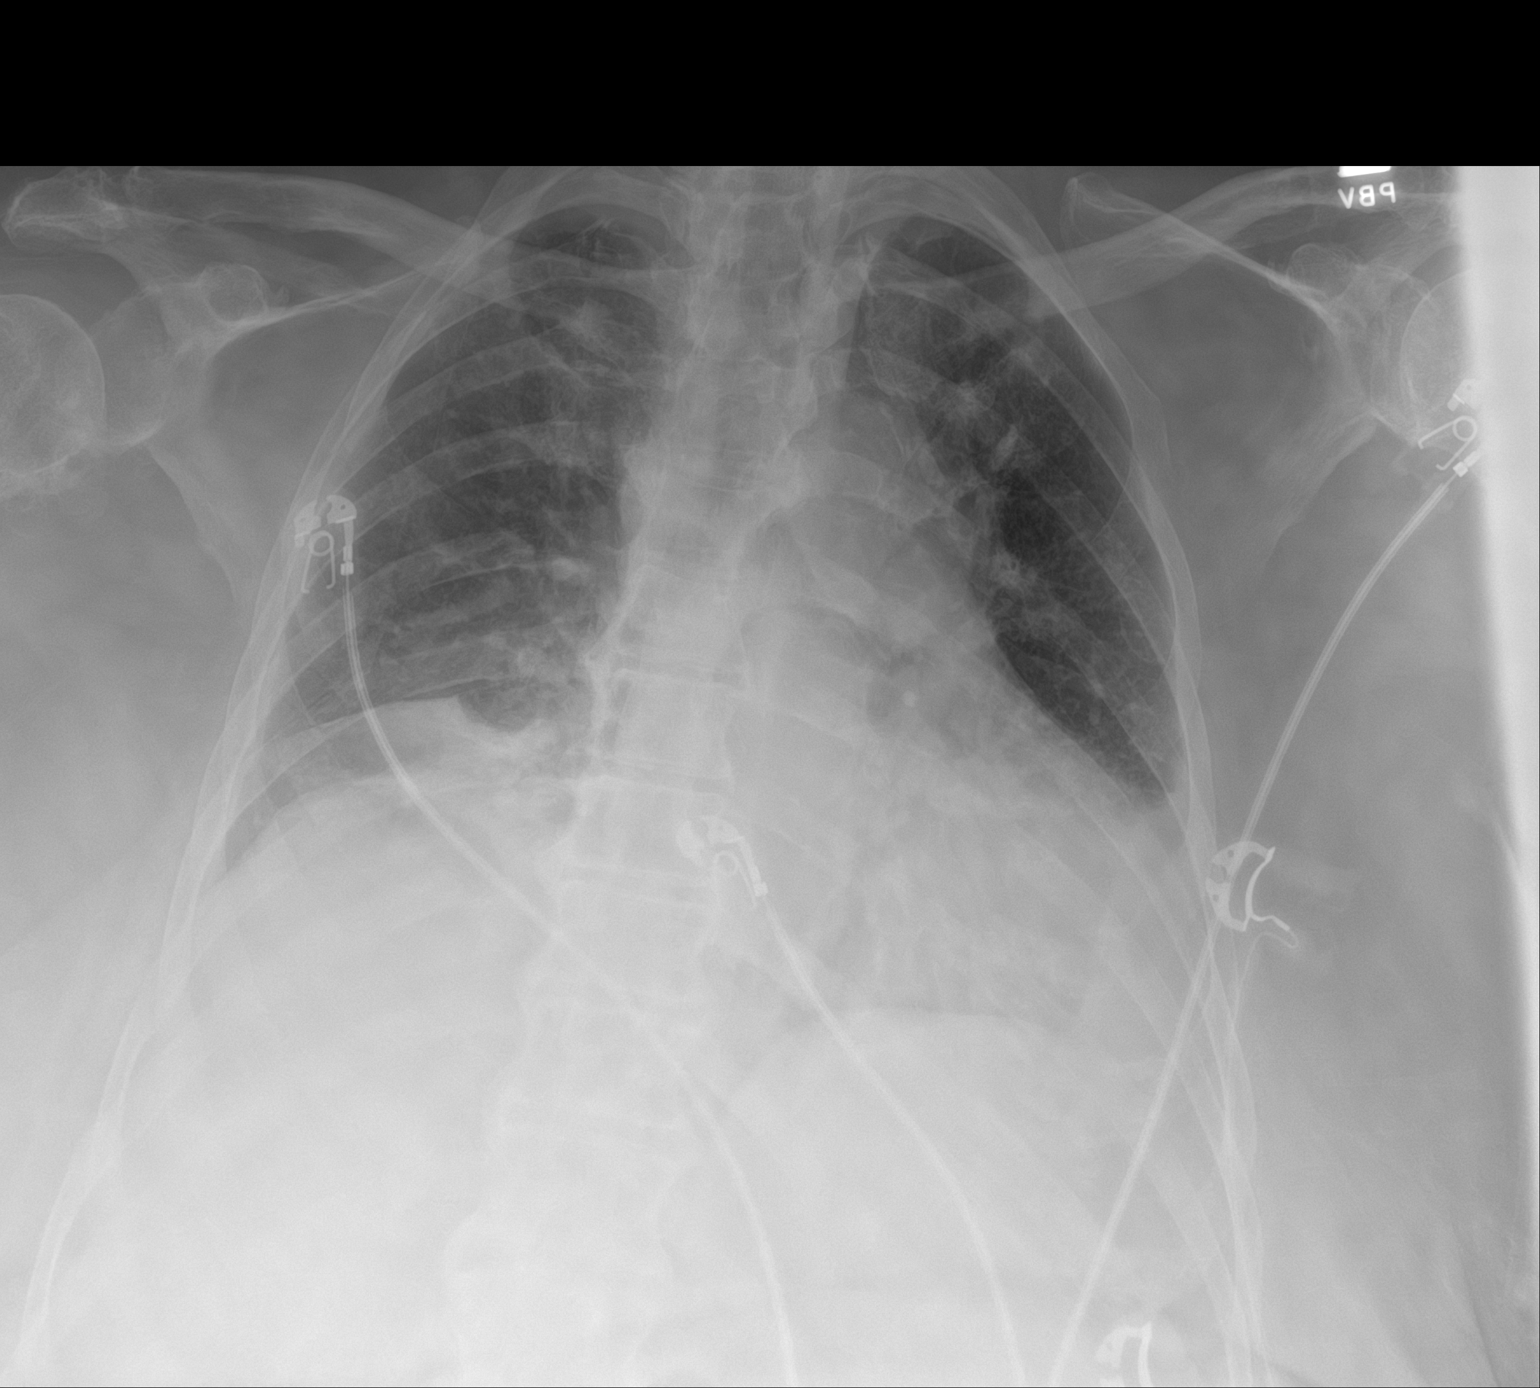

[1 of 1 positions shown; findings below may reference images not displayed]

FINDINGS: Single frontal view of the chest demonstrates an enlarged cardiac
silhouette. There is right middle lobe consolidation compatible with
airspace disease. Pleural effusion left lateral costophrenic angle.
Diffuse interstitial prominence compatible with chronic scarring.
There is increased central vascular congestion. No pneumothorax.
IMPRESSION: 1. Right middle lobe consolidation most compatible with pneumonia.
2. Small left pleural effusion.
3. Central vascular congestion.

## 2022-10-02 IMAGING — US US RENAL
1 series · 14 of 22 positions shown · non-contrast
Comparison: February 08, 2016.

CLINICAL DATA: Acute urinary retention.

EXAM:
RENAL / URINARY TRACT ULTRASOUND COMPLETE

[Series 1: us renal · 14 of 22 slices shown]
[im 1/22]
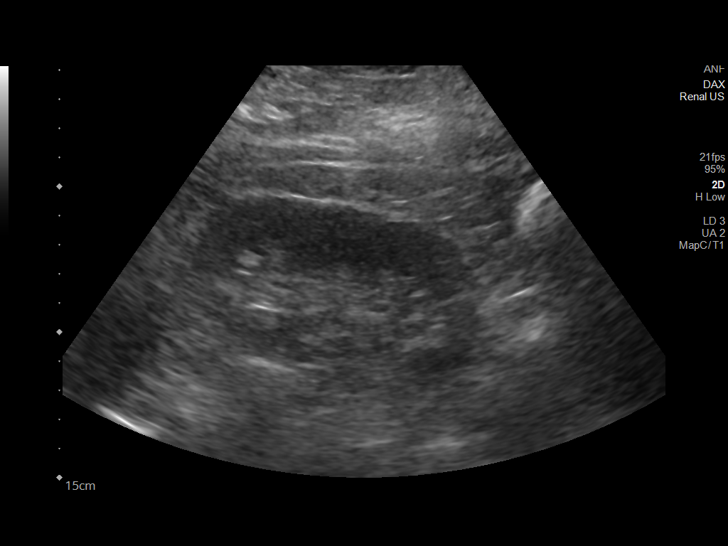
[im 3/22]
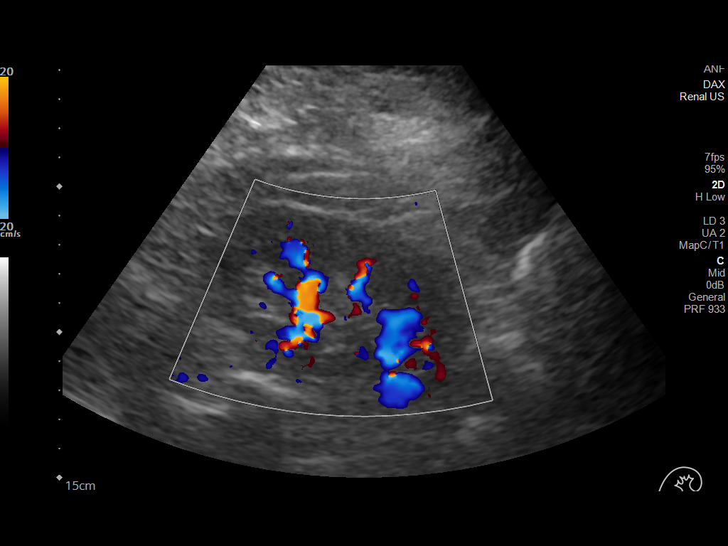
[im 4/22]
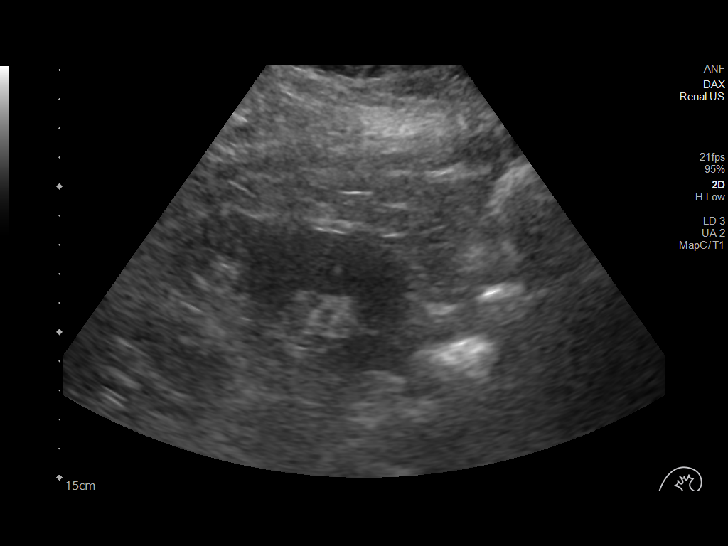
[im 6/22]
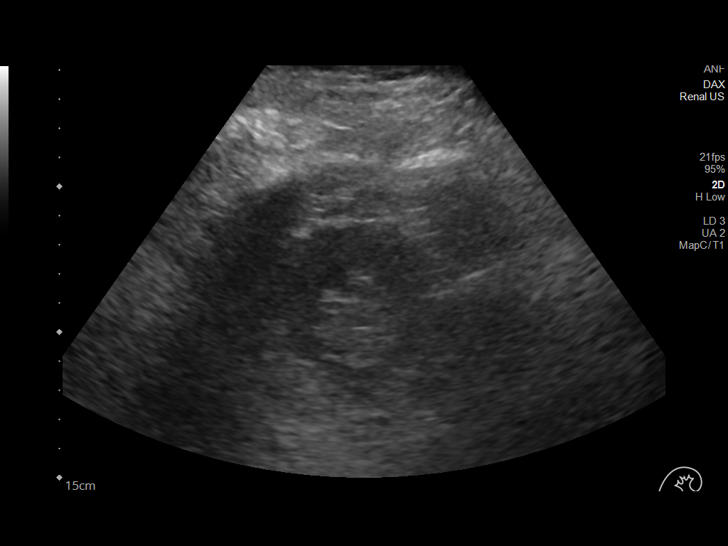
[im 8/22]
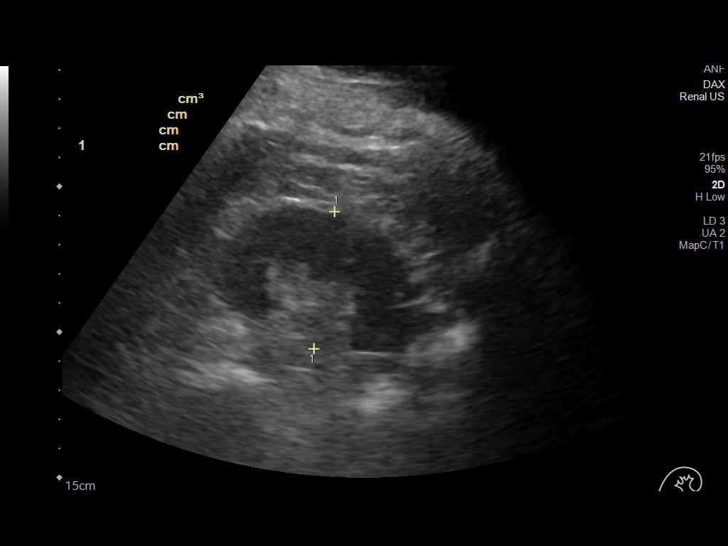
[im 9/22]
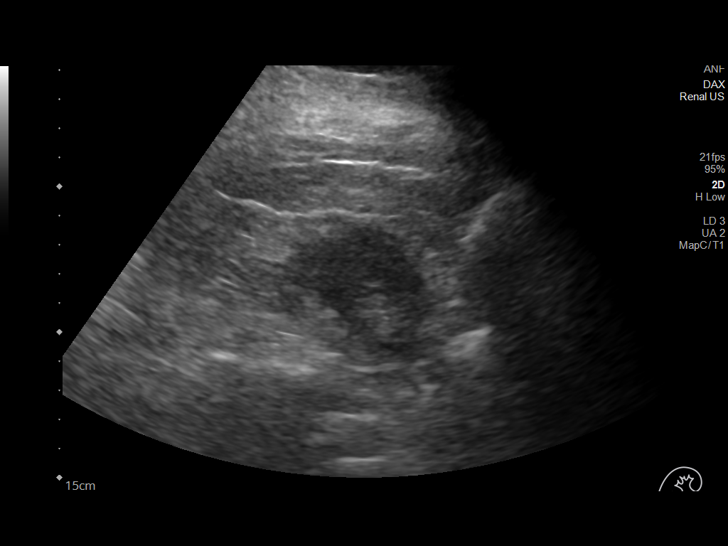
[im 11/22]
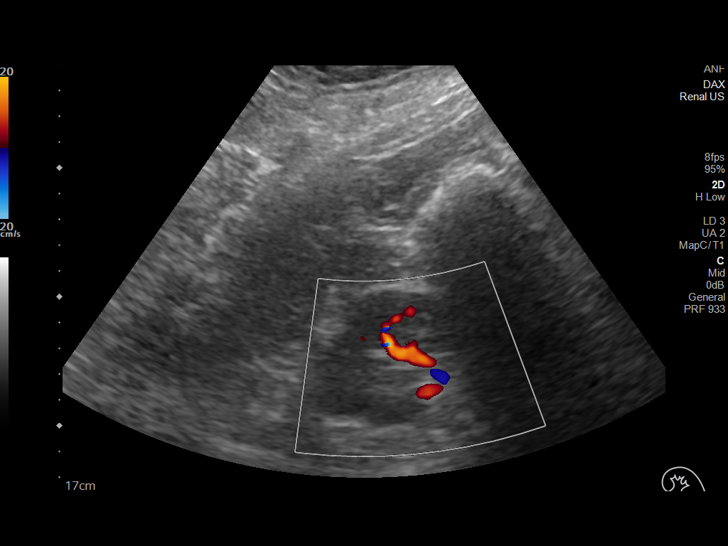
[im 12/22]
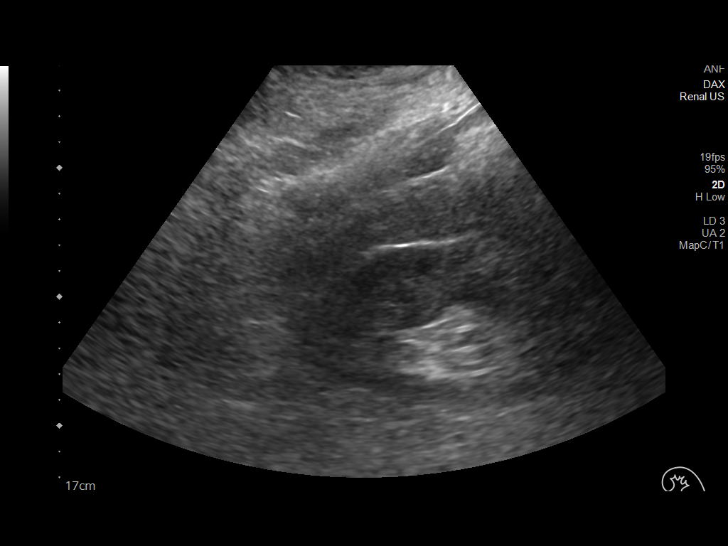
[im 14/22]
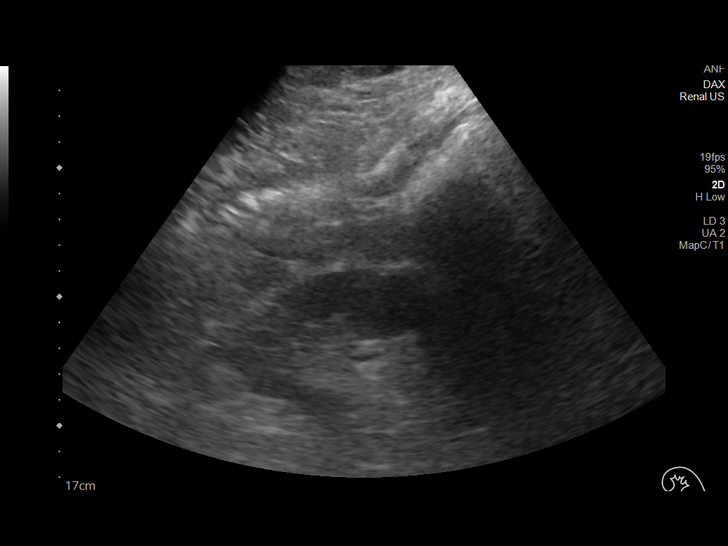
[im 15/22]
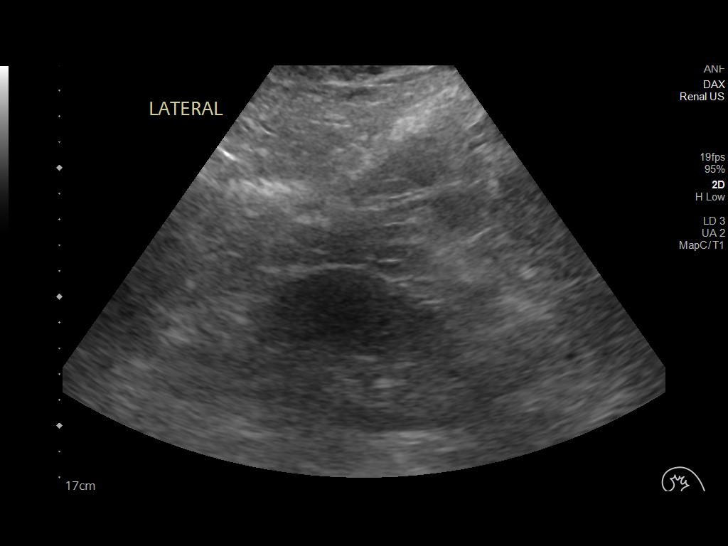
[im 17/22]
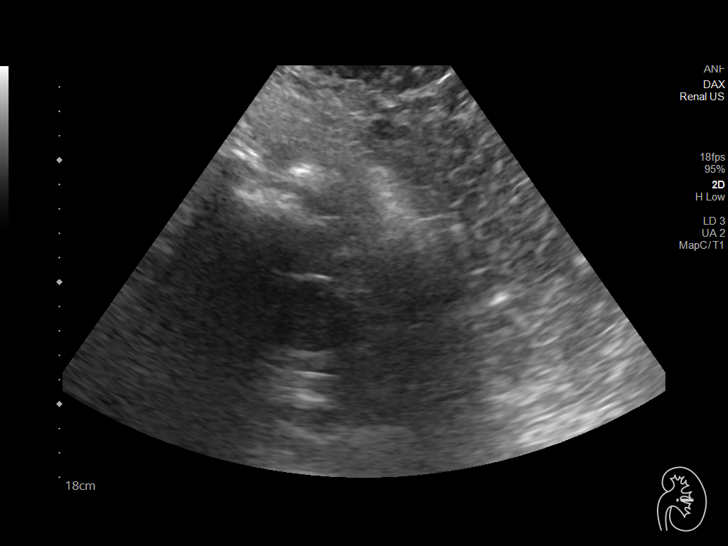
[im 19/22]
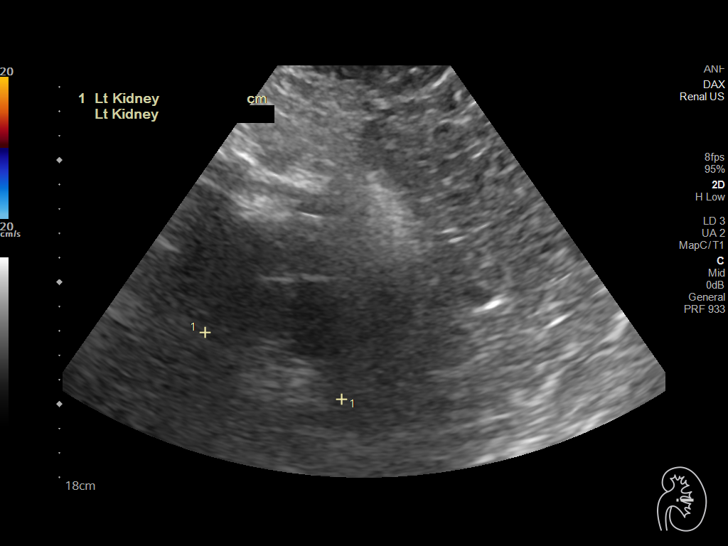
[im 20/22]
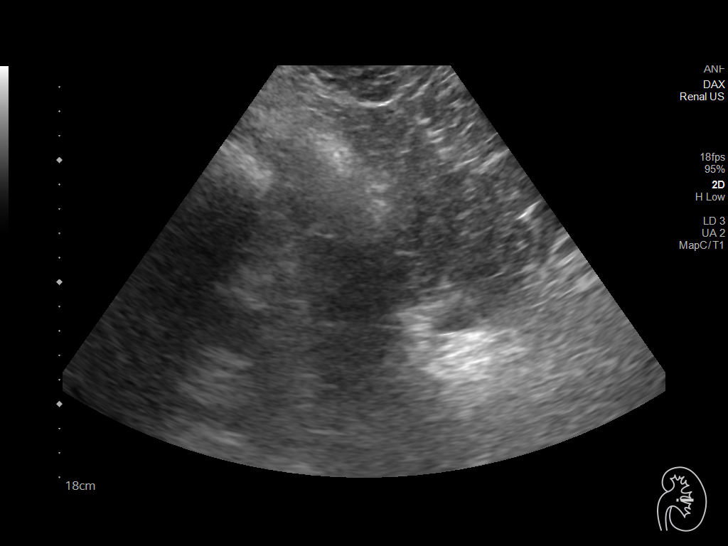
[im 22/22]
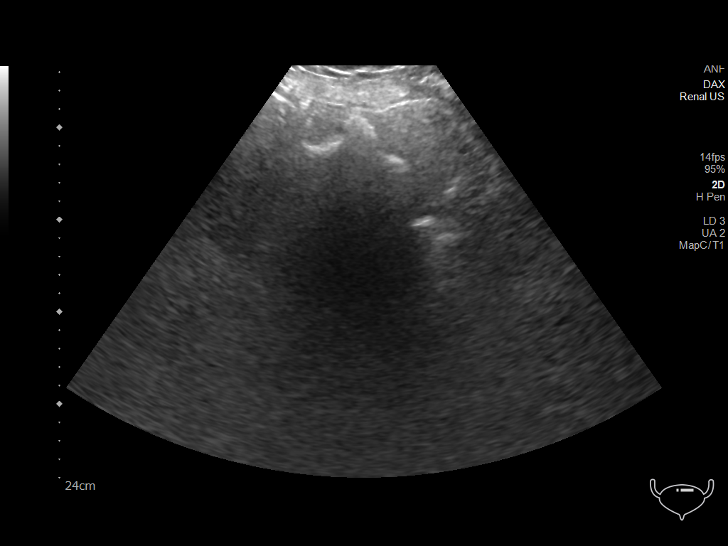

[14 of 22 positions shown; findings below may reference images not displayed]

FINDINGS: Right Kidney:

Renal measurements: 10.3 x 5.7 x 4.8 cm = volume: 146 mL.
Echogenicity within normal limits. No mass or hydronephrosis
visualized.

Left Kidney:

Renal measurements: 11.4 x 6.3 x 6.2 cm = volume: 235 mL.
Echogenicity within normal limits. No mass or hydronephrosis
visualized.

Bladder:

Not visualized due to persistent patient motion.

Other:

None.
IMPRESSION: Normal renal ultrasound.
